# Patient Record
Sex: Female | Born: 1960 | Race: White | Hispanic: No | Marital: Single | Smoking: Current every day smoker
Health system: Southern US, Community
[De-identification: ages and names within clinical notes are randomized; demographics above are authoritative.]

## PROBLEM LIST (undated history)

## (undated) DIAGNOSIS — E079 Disorder of thyroid, unspecified: Secondary | ICD-10-CM

## (undated) DIAGNOSIS — I10 Essential (primary) hypertension: Secondary | ICD-10-CM

## (undated) DIAGNOSIS — T4145XA Adverse effect of unspecified anesthetic, initial encounter: Secondary | ICD-10-CM

## (undated) DIAGNOSIS — M170 Bilateral primary osteoarthritis of knee: Secondary | ICD-10-CM

## (undated) DIAGNOSIS — E039 Hypothyroidism, unspecified: Secondary | ICD-10-CM

## (undated) DIAGNOSIS — F419 Anxiety disorder, unspecified: Secondary | ICD-10-CM

## (undated) DIAGNOSIS — T8859XA Other complications of anesthesia, initial encounter: Secondary | ICD-10-CM

## (undated) HISTORY — DX: Essential (primary) hypertension: I10

## (undated) HISTORY — PX: CHOLECYSTECTOMY: SHX55

## (undated) HISTORY — PX: BUNIONECTOMY: SHX129

## (undated) HISTORY — DX: Anxiety disorder, unspecified: F41.9

## (undated) HISTORY — DX: Disorder of thyroid, unspecified: E07.9

---

## 2004-10-03 ENCOUNTER — Ambulatory Visit: Payer: Self-pay | Admitting: Family Medicine

## 2004-10-17 ENCOUNTER — Ambulatory Visit: Payer: Self-pay | Admitting: Family Medicine

## 2004-11-17 ENCOUNTER — Ambulatory Visit: Payer: Self-pay | Admitting: Family Medicine

## 2004-12-22 ENCOUNTER — Ambulatory Visit: Payer: Self-pay | Admitting: Family Medicine

## 2005-03-05 ENCOUNTER — Ambulatory Visit: Payer: Self-pay | Admitting: Family Medicine

## 2005-06-22 ENCOUNTER — Ambulatory Visit: Payer: Self-pay | Admitting: Family Medicine

## 2005-10-19 ENCOUNTER — Ambulatory Visit: Payer: Self-pay | Admitting: Family Medicine

## 2005-11-06 ENCOUNTER — Ambulatory Visit: Payer: Self-pay | Admitting: Family Medicine

## 2005-11-08 ENCOUNTER — Ambulatory Visit: Payer: Self-pay | Admitting: Family Medicine

## 2005-11-08 ENCOUNTER — Encounter: Payer: Self-pay | Admitting: Family Medicine

## 2005-11-08 ENCOUNTER — Other Ambulatory Visit: Admission: RE | Admit: 2005-11-08 | Discharge: 2005-11-08 | Payer: Self-pay | Admitting: Family Medicine

## 2005-11-08 LAB — CONVERTED CEMR LAB
Albumin: 3.7 g/dL (ref 3.5–5.2)
BUN: 12 mg/dL (ref 6–23)
Basophils Absolute: 0.1 10*3/uL (ref 0.0–0.1)
CO2: 26 meq/L (ref 19–32)
Chloride: 103 meq/L (ref 96–112)
Creatinine, Ser: 0.8 mg/dL (ref 0.4–1.2)
Free T4: 1.3 ng/dL (ref 0.9–1.8)
HDL: 25.7 mg/dL — ABNORMAL LOW (ref >39.0)
Hemoglobin: 14.5 g/dL (ref 12.0–15.0)
MCHC: 33.8 g/dL (ref 30.0–36.0)
MCV: 92.7 fL (ref 78.0–100.0)
Monocytes Absolute: 0.5 10*3/uL (ref 0.2–0.7)
Monocytes Relative: 5.9 % (ref 3.0–11.0)
Neutro Abs: 5.2 10*3/uL (ref 1.4–7.7)
Neutrophils Relative %: 61.5 % (ref 43.0–77.0)
RDW: 12.5 % (ref 11.5–14.6)
Sodium: 137 meq/L (ref 135–145)
Total Bilirubin: 0.6 mg/dL (ref 0.3–1.2)
Triglyceride fasting, serum: 150 mg/dL — ABNORMAL HIGH (ref 0–149)
VLDL: 30 mg/dL (ref 0–40)

## 2005-11-15 ENCOUNTER — Ambulatory Visit: Payer: Self-pay

## 2005-11-15 ENCOUNTER — Encounter: Payer: Self-pay | Admitting: Cardiology

## 2005-11-15 ENCOUNTER — Encounter: Admission: RE | Admit: 2005-11-15 | Discharge: 2005-11-15 | Payer: Self-pay | Admitting: Family Medicine

## 2006-01-25 ENCOUNTER — Ambulatory Visit: Payer: Self-pay | Admitting: Family Medicine

## 2006-03-12 DIAGNOSIS — E039 Hypothyroidism, unspecified: Secondary | ICD-10-CM

## 2006-03-12 DIAGNOSIS — I10 Essential (primary) hypertension: Secondary | ICD-10-CM

## 2006-08-23 ENCOUNTER — Ambulatory Visit: Payer: Self-pay | Admitting: Family Medicine

## 2006-08-23 DIAGNOSIS — L723 Sebaceous cyst: Secondary | ICD-10-CM

## 2006-09-06 ENCOUNTER — Ambulatory Visit: Payer: Self-pay | Admitting: Family Medicine

## 2006-09-19 ENCOUNTER — Ambulatory Visit: Payer: Self-pay | Admitting: Family Medicine

## 2006-10-09 ENCOUNTER — Telehealth (INDEPENDENT_AMBULATORY_CARE_PROVIDER_SITE_OTHER): Payer: Self-pay | Admitting: *Deleted

## 2007-02-17 ENCOUNTER — Telehealth (INDEPENDENT_AMBULATORY_CARE_PROVIDER_SITE_OTHER): Payer: Self-pay | Admitting: *Deleted

## 2007-03-10 ENCOUNTER — Encounter: Payer: Self-pay | Admitting: Family Medicine

## 2007-03-10 ENCOUNTER — Ambulatory Visit: Payer: Self-pay | Admitting: Family Medicine

## 2007-03-10 ENCOUNTER — Other Ambulatory Visit: Admission: RE | Admit: 2007-03-10 | Discharge: 2007-03-10 | Payer: Self-pay | Admitting: Family Medicine

## 2007-03-10 DIAGNOSIS — G43909 Migraine, unspecified, not intractable, without status migrainosus: Secondary | ICD-10-CM | POA: Insufficient documentation

## 2007-03-10 LAB — CONVERTED CEMR LAB
Bilirubin Urine: NEGATIVE
Blood in Urine, dipstick: NEGATIVE
Glucose, Urine, Semiquant: NEGATIVE
Ketones, urine, test strip: NEGATIVE
Protein, U semiquant: NEGATIVE
Urobilinogen, UA: NEGATIVE
pH: 5

## 2007-03-17 ENCOUNTER — Encounter (INDEPENDENT_AMBULATORY_CARE_PROVIDER_SITE_OTHER): Payer: Self-pay | Admitting: *Deleted

## 2007-03-21 LAB — CONVERTED CEMR LAB
AST: 14 units/L (ref 0–37)
Albumin: 3.6 g/dL (ref 3.5–5.2)
Alkaline Phosphatase: 73 units/L (ref 39–117)
BUN: 12 mg/dL (ref 6–23)
Basophils Absolute: 0.3 10*3/uL — ABNORMAL HIGH (ref 0.0–0.1)
Chloride: 103 meq/L (ref 96–112)
Cholesterol: 226 mg/dL (ref 0–200)
Creatinine, Ser: 0.7 mg/dL (ref 0.4–1.2)
Direct LDL: 177.7 mg/dL
Eosinophils Absolute: 0.3 10*3/uL (ref 0.0–0.6)
GFR calc non Af Amer: 95 mL/min
HDL: 27.1 mg/dL — ABNORMAL LOW (ref >39.0)
MCHC: 33.6 g/dL (ref 30.0–36.0)
MCV: 94.5 fL (ref 78.0–100.0)
Monocytes Relative: 17.5 % — ABNORMAL HIGH (ref 3.0–11.0)
Neutrophils Relative %: 28.9 % — ABNORMAL LOW (ref 43.0–77.0)
Platelets: 192 10*3/uL (ref 150–400)
RBC: 4.63 M/uL (ref 3.87–5.11)
RDW: 12.5 % (ref 11.5–14.6)
Sodium: 137 meq/L (ref 135–145)
TSH: 2.89 microintl units/mL (ref 0.35–5.50)
Total Bilirubin: 0.7 mg/dL (ref 0.3–1.2)
Total CHOL/HDL Ratio: 8.3
Triglycerides: 130 mg/dL (ref 0–149)

## 2007-05-05 ENCOUNTER — Telehealth (INDEPENDENT_AMBULATORY_CARE_PROVIDER_SITE_OTHER): Payer: Self-pay | Admitting: *Deleted

## 2007-05-06 ENCOUNTER — Ambulatory Visit: Payer: Self-pay | Admitting: Family Medicine

## 2007-05-06 DIAGNOSIS — S301XXA Contusion of abdominal wall, initial encounter: Secondary | ICD-10-CM

## 2007-05-06 DIAGNOSIS — F411 Generalized anxiety disorder: Secondary | ICD-10-CM

## 2007-05-06 DIAGNOSIS — S8000XA Contusion of unspecified knee, initial encounter: Secondary | ICD-10-CM

## 2007-05-06 DIAGNOSIS — S9030XA Contusion of unspecified foot, initial encounter: Secondary | ICD-10-CM

## 2007-05-09 ENCOUNTER — Telehealth: Payer: Self-pay | Admitting: Family Medicine

## 2007-05-09 DIAGNOSIS — M25569 Pain in unspecified knee: Secondary | ICD-10-CM

## 2007-05-12 ENCOUNTER — Encounter: Payer: Self-pay | Admitting: Family Medicine

## 2007-05-14 ENCOUNTER — Telehealth (INDEPENDENT_AMBULATORY_CARE_PROVIDER_SITE_OTHER): Payer: Self-pay | Admitting: *Deleted

## 2007-05-21 ENCOUNTER — Encounter: Payer: Self-pay | Admitting: Family Medicine

## 2007-05-23 HISTORY — PX: KNEE SURGERY: SHX244

## 2007-05-26 ENCOUNTER — Ambulatory Visit (HOSPITAL_COMMUNITY): Admission: RE | Admit: 2007-05-26 | Discharge: 2007-05-26 | Payer: Self-pay | Admitting: Orthopedic Surgery

## 2007-06-30 ENCOUNTER — Encounter: Payer: Self-pay | Admitting: Family Medicine

## 2007-07-10 ENCOUNTER — Encounter: Payer: Self-pay | Admitting: Internal Medicine

## 2007-07-10 ENCOUNTER — Telehealth (INDEPENDENT_AMBULATORY_CARE_PROVIDER_SITE_OTHER): Payer: Self-pay | Admitting: *Deleted

## 2007-07-11 ENCOUNTER — Ambulatory Visit: Payer: Self-pay | Admitting: Family Medicine

## 2007-07-17 ENCOUNTER — Ambulatory Visit: Payer: Self-pay | Admitting: Internal Medicine

## 2007-07-17 DIAGNOSIS — F329 Major depressive disorder, single episode, unspecified: Secondary | ICD-10-CM | POA: Insufficient documentation

## 2007-07-17 DIAGNOSIS — H1030 Unspecified acute conjunctivitis, unspecified eye: Secondary | ICD-10-CM | POA: Insufficient documentation

## 2007-07-21 ENCOUNTER — Telehealth (INDEPENDENT_AMBULATORY_CARE_PROVIDER_SITE_OTHER): Payer: Self-pay | Admitting: *Deleted

## 2007-07-22 ENCOUNTER — Ambulatory Visit: Payer: Self-pay | Admitting: Internal Medicine

## 2007-07-22 DIAGNOSIS — R5383 Other fatigue: Secondary | ICD-10-CM

## 2007-07-22 DIAGNOSIS — R5381 Other malaise: Secondary | ICD-10-CM

## 2007-07-22 DIAGNOSIS — K219 Gastro-esophageal reflux disease without esophagitis: Secondary | ICD-10-CM

## 2007-07-23 ENCOUNTER — Ambulatory Visit: Payer: Self-pay | Admitting: Internal Medicine

## 2007-07-26 LAB — CONVERTED CEMR LAB
AST: 23 units/L (ref 0–37)
Alkaline Phosphatase: 86 units/L (ref 39–117)
Basophils Relative: 0.9 % (ref 0.0–1.0)
Bilirubin, Direct: 0.1 mg/dL (ref 0.0–0.3)
GFR calc non Af Amer: 95 mL/min
HCT: 41.6 % (ref 36.0–46.0)
Hemoglobin: 14.6 g/dL (ref 12.0–15.0)
Lymphocytes Relative: 24.4 % (ref 12.0–46.0)
Monocytes Absolute: 0.2 10*3/uL (ref 0.1–1.0)
Monocytes Relative: 2.2 % — ABNORMAL LOW (ref 3.0–12.0)
Neutro Abs: 5.8 10*3/uL (ref 1.4–7.7)
RBC: 4.41 M/uL (ref 3.87–5.11)
Total Bilirubin: 0.8 mg/dL (ref 0.3–1.2)

## 2007-07-28 ENCOUNTER — Encounter (INDEPENDENT_AMBULATORY_CARE_PROVIDER_SITE_OTHER): Payer: Self-pay | Admitting: *Deleted

## 2007-07-31 ENCOUNTER — Encounter: Payer: Self-pay | Admitting: Family Medicine

## 2007-08-19 ENCOUNTER — Ambulatory Visit: Payer: Self-pay | Admitting: Family Medicine

## 2007-08-25 ENCOUNTER — Telehealth (INDEPENDENT_AMBULATORY_CARE_PROVIDER_SITE_OTHER): Payer: Self-pay | Admitting: *Deleted

## 2007-08-26 ENCOUNTER — Ambulatory Visit: Payer: Self-pay | Admitting: Family Medicine

## 2007-08-27 ENCOUNTER — Encounter: Payer: Self-pay | Admitting: Family Medicine

## 2007-09-17 ENCOUNTER — Telehealth (INDEPENDENT_AMBULATORY_CARE_PROVIDER_SITE_OTHER): Payer: Self-pay | Admitting: *Deleted

## 2007-09-18 ENCOUNTER — Encounter: Payer: Self-pay | Admitting: Family Medicine

## 2007-09-24 ENCOUNTER — Telehealth: Payer: Self-pay | Admitting: Family Medicine

## 2007-10-01 ENCOUNTER — Encounter: Payer: Self-pay | Admitting: Family Medicine

## 2007-11-04 ENCOUNTER — Ambulatory Visit: Payer: Self-pay | Admitting: Family Medicine

## 2007-11-04 DIAGNOSIS — H6690 Otitis media, unspecified, unspecified ear: Secondary | ICD-10-CM | POA: Insufficient documentation

## 2007-11-19 ENCOUNTER — Telehealth (INDEPENDENT_AMBULATORY_CARE_PROVIDER_SITE_OTHER): Payer: Self-pay | Admitting: *Deleted

## 2007-11-27 ENCOUNTER — Encounter: Payer: Self-pay | Admitting: Family Medicine

## 2007-12-15 ENCOUNTER — Telehealth (INDEPENDENT_AMBULATORY_CARE_PROVIDER_SITE_OTHER): Payer: Self-pay | Admitting: *Deleted

## 2008-01-21 ENCOUNTER — Ambulatory Visit: Payer: Self-pay | Admitting: Family Medicine

## 2008-01-21 DIAGNOSIS — M5430 Sciatica, unspecified side: Secondary | ICD-10-CM | POA: Insufficient documentation

## 2008-02-11 ENCOUNTER — Telehealth: Payer: Self-pay | Admitting: Family Medicine

## 2008-03-15 ENCOUNTER — Ambulatory Visit: Payer: Self-pay | Admitting: Family Medicine

## 2008-03-15 LAB — CONVERTED CEMR LAB
Bilirubin Urine: NEGATIVE
Glucose, Urine, Semiquant: NEGATIVE
Protein, U semiquant: NEGATIVE
Specific Gravity, Urine: <1.005
pH: 7.5

## 2008-03-25 LAB — CONVERTED CEMR LAB
AST: 16 units/L (ref 0–37)
Albumin: 3.4 g/dL — ABNORMAL LOW (ref 3.5–5.2)
BUN: 8 mg/dL (ref 6–23)
Basophils Absolute: 0 10*3/uL (ref 0.0–0.1)
Basophils Relative: 0.3 % (ref 0.0–3.0)
Calcium: 8.8 mg/dL (ref 8.4–10.5)
Chloride: 103 meq/L (ref 96–112)
Cholesterol: 189 mg/dL (ref 0–200)
Creatinine, Ser: 0.7 mg/dL (ref 0.4–1.2)
Eosinophils Absolute: 0.3 10*3/uL (ref 0.0–0.7)
GFR calc Af Amer: 115 mL/min
GFR calc non Af Amer: 95 mL/min
HCT: 41.5 % (ref 36.0–46.0)
HDL: 30.8 mg/dL — ABNORMAL LOW (ref >39.0)
MCHC: 33.8 g/dL (ref 30.0–36.0)
MCV: 94.1 fL (ref 78.0–100.0)
Monocytes Absolute: 0.4 10*3/uL (ref 0.1–1.0)
Neutrophils Relative %: 55.6 % (ref 43.0–77.0)
Platelets: 172 10*3/uL (ref 150–400)
RBC: 4.41 M/uL (ref 3.87–5.11)
TSH: 2.64 microintl units/mL (ref 0.35–5.50)
Total Bilirubin: 0.6 mg/dL (ref 0.3–1.2)
VLDL: 16 mg/dL (ref 0–40)

## 2008-03-26 ENCOUNTER — Encounter (INDEPENDENT_AMBULATORY_CARE_PROVIDER_SITE_OTHER): Payer: Self-pay | Admitting: *Deleted

## 2008-04-08 ENCOUNTER — Encounter: Payer: Self-pay | Admitting: Family Medicine

## 2008-04-29 ENCOUNTER — Encounter: Payer: Self-pay | Admitting: Family Medicine

## 2008-04-29 ENCOUNTER — Ambulatory Visit: Payer: Self-pay | Admitting: Family Medicine

## 2008-04-29 ENCOUNTER — Other Ambulatory Visit: Admission: RE | Admit: 2008-04-29 | Discharge: 2008-04-29 | Payer: Self-pay | Admitting: Family Medicine

## 2008-05-01 ENCOUNTER — Encounter: Payer: Self-pay | Admitting: Family Medicine

## 2008-05-04 ENCOUNTER — Encounter (INDEPENDENT_AMBULATORY_CARE_PROVIDER_SITE_OTHER): Payer: Self-pay | Admitting: *Deleted

## 2008-05-13 ENCOUNTER — Encounter (INDEPENDENT_AMBULATORY_CARE_PROVIDER_SITE_OTHER): Payer: Self-pay | Admitting: *Deleted

## 2008-06-07 ENCOUNTER — Encounter: Payer: Self-pay | Admitting: Family Medicine

## 2008-06-18 ENCOUNTER — Encounter: Payer: Self-pay | Admitting: Family Medicine

## 2008-06-23 ENCOUNTER — Telehealth: Payer: Self-pay | Admitting: Family Medicine

## 2008-06-24 ENCOUNTER — Encounter: Admission: RE | Admit: 2008-06-24 | Discharge: 2008-06-24 | Payer: Self-pay | Admitting: Family Medicine

## 2008-06-25 ENCOUNTER — Ambulatory Visit (HOSPITAL_COMMUNITY): Admission: RE | Admit: 2008-06-25 | Discharge: 2008-06-25 | Payer: Self-pay | Admitting: Surgery

## 2008-07-13 ENCOUNTER — Ambulatory Visit (HOSPITAL_BASED_OUTPATIENT_CLINIC_OR_DEPARTMENT_OTHER): Admission: RE | Admit: 2008-07-13 | Discharge: 2008-07-13 | Payer: Self-pay | Admitting: Surgery

## 2008-07-13 ENCOUNTER — Encounter: Admission: RE | Admit: 2008-07-13 | Discharge: 2008-07-13 | Payer: Self-pay | Admitting: Surgery

## 2008-07-14 ENCOUNTER — Ambulatory Visit (HOSPITAL_COMMUNITY): Admission: RE | Admit: 2008-07-14 | Discharge: 2008-07-14 | Payer: Self-pay | Admitting: Surgery

## 2008-07-17 ENCOUNTER — Ambulatory Visit: Payer: Self-pay | Admitting: Internal Medicine

## 2008-09-01 ENCOUNTER — Encounter: Payer: Self-pay | Admitting: Family Medicine

## 2008-09-29 ENCOUNTER — Encounter: Payer: Self-pay | Admitting: Family Medicine

## 2008-10-27 ENCOUNTER — Encounter: Payer: Self-pay | Admitting: Family Medicine

## 2008-11-01 ENCOUNTER — Ambulatory Visit: Payer: Self-pay | Admitting: Family Medicine

## 2008-11-19 ENCOUNTER — Ambulatory Visit: Payer: Self-pay | Admitting: Family Medicine

## 2009-05-05 ENCOUNTER — Ambulatory Visit: Payer: Self-pay | Admitting: Diagnostic Radiology

## 2009-05-05 ENCOUNTER — Ambulatory Visit (HOSPITAL_BASED_OUTPATIENT_CLINIC_OR_DEPARTMENT_OTHER): Admission: RE | Admit: 2009-05-05 | Discharge: 2009-05-05 | Payer: Self-pay | Admitting: Family Medicine

## 2009-05-05 ENCOUNTER — Ambulatory Visit: Payer: Self-pay | Admitting: Family Medicine

## 2009-05-05 DIAGNOSIS — R0602 Shortness of breath: Secondary | ICD-10-CM

## 2009-05-05 DIAGNOSIS — R000 Tachycardia, unspecified: Secondary | ICD-10-CM

## 2009-05-05 DIAGNOSIS — F438 Other reactions to severe stress: Secondary | ICD-10-CM

## 2009-05-05 LAB — CONVERTED CEMR LAB
ALT: 24 units/L (ref 0–35)
AST: 19 units/L (ref 0–37)
Albumin: 3.8 g/dL (ref 3.5–5.2)
Alkaline Phosphatase: 81 units/L (ref 39–117)
BUN: 11 mg/dL (ref 6–23)
Basophils Absolute: 0.1 10*3/uL (ref 0.0–0.1)
Basophils Relative: 0.8 % (ref 0.0–3.0)
Bilirubin, Direct: 0 mg/dL (ref 0.0–0.3)
CK-MB: 1.8 ng/mL (ref 0.3–4.0)
CO2: 28 meq/L (ref 19–32)
Calcium: 9.3 mg/dL (ref 8.4–10.5)
Chloride: 103 meq/L (ref 96–112)
Creatinine, Ser: 0.6 mg/dL (ref 0.4–1.2)
Eosinophils Absolute: 0.3 10*3/uL (ref 0.0–0.7)
Eosinophils Relative: 3.7 % (ref 0.0–5.0)
GFR calc non Af Amer: 112.8 mL/min (ref >60)
Glucose, Bld: 125 mg/dL — ABNORMAL HIGH (ref 70–99)
HCT: 41.1 % (ref 36.0–46.0)
Hemoglobin: 14.2 g/dL (ref 12.0–15.0)
Lymphocytes Relative: 26.4 % (ref 12.0–46.0)
Lymphs Abs: 2 10*3/uL (ref 0.7–4.0)
MCHC: 34.4 g/dL (ref 30.0–36.0)
MCV: 94 fL (ref 78.0–100.0)
Monocytes Absolute: 0.4 10*3/uL (ref 0.1–1.0)
Monocytes Relative: 6 % (ref 3.0–12.0)
Neutro Abs: 4.7 10*3/uL (ref 1.4–7.7)
Neutrophils Relative %: 63.1 % (ref 43.0–77.0)
Platelets: 198 10*3/uL (ref 150.0–400.0)
Potassium: 4.6 meq/L (ref 3.5–5.1)
RBC: 4.37 M/uL (ref 3.87–5.11)
RDW: 13.9 % (ref 11.5–14.6)
Relative Index: 2.6 — ABNORMAL HIGH (ref 0.0–2.5)
Sodium: 139 meq/L (ref 135–145)
TSH: 7.45 microintl units/mL — ABNORMAL HIGH (ref 0.35–5.50)
Total Bilirubin: 0.5 mg/dL (ref 0.3–1.2)
Total CK: 70 units/L (ref 7–177)
Total Protein: 7.4 g/dL (ref 6.0–8.3)
Troponin I: <0.01 ng/mL (ref <0.06)
WBC: 7.4 10*3/uL (ref 4.5–10.5)

## 2009-05-06 ENCOUNTER — Telehealth: Payer: Self-pay | Admitting: Family Medicine

## 2009-05-06 ENCOUNTER — Ambulatory Visit: Payer: Self-pay | Admitting: Family Medicine

## 2009-05-11 ENCOUNTER — Encounter (INDEPENDENT_AMBULATORY_CARE_PROVIDER_SITE_OTHER): Payer: Self-pay | Admitting: *Deleted

## 2009-05-27 ENCOUNTER — Encounter: Payer: Self-pay | Admitting: Family Medicine

## 2009-05-27 ENCOUNTER — Ambulatory Visit: Payer: Self-pay | Admitting: Cardiology

## 2009-05-27 ENCOUNTER — Ambulatory Visit (HOSPITAL_COMMUNITY): Admission: RE | Admit: 2009-05-27 | Discharge: 2009-05-27 | Payer: Self-pay | Admitting: Family Medicine

## 2009-05-27 ENCOUNTER — Ambulatory Visit: Payer: Self-pay

## 2009-05-30 ENCOUNTER — Telehealth (INDEPENDENT_AMBULATORY_CARE_PROVIDER_SITE_OTHER): Payer: Self-pay | Admitting: *Deleted

## 2009-11-11 ENCOUNTER — Ambulatory Visit: Payer: Self-pay | Admitting: Family Medicine

## 2009-11-11 DIAGNOSIS — F4310 Post-traumatic stress disorder, unspecified: Secondary | ICD-10-CM

## 2009-12-01 ENCOUNTER — Telehealth (INDEPENDENT_AMBULATORY_CARE_PROVIDER_SITE_OTHER): Payer: Self-pay | Admitting: *Deleted

## 2010-01-31 ENCOUNTER — Ambulatory Visit
Admission: RE | Admit: 2010-01-31 | Discharge: 2010-01-31 | Payer: Self-pay | Source: Home / Self Care | Attending: Family Medicine | Admitting: Family Medicine

## 2010-01-31 ENCOUNTER — Encounter: Payer: Self-pay | Admitting: Family Medicine

## 2010-01-31 DIAGNOSIS — M25559 Pain in unspecified hip: Secondary | ICD-10-CM | POA: Insufficient documentation

## 2010-02-02 ENCOUNTER — Encounter: Payer: Self-pay | Admitting: Family Medicine

## 2010-02-03 ENCOUNTER — Encounter (INDEPENDENT_AMBULATORY_CARE_PROVIDER_SITE_OTHER): Payer: Self-pay | Admitting: *Deleted

## 2010-02-21 NOTE — Progress Notes (Signed)
Summary: Results  Phone Note Call from Patient Call back at Home Phone [phone]   Caller: Patient Summary of Call: Patient is requesting a call back concerning the results of her echo. Please advise Initial call taken by: Barb Merino,  May 06, 2009 11:09 AM  Follow-up for Phone Call        pt requesting that dr Laury Axon give her a call to discuss chest x-ray results further in detail and why echo is being requested..........Marland KitchenFelecia Deloach CMA  May 06, 2009 11:30 AM   Additional Follow-up for Phone Call Additional follow up Details #1::        Here heart is big on xray ( from htn and weight) but the pulmonary vessels  were prominent and the radiologist could not r/o congestion---we need to check function of the heart. Additional Follow-up by: Loreen Freud DO,  May 06, 2009 11:35 AM    Additional Follow-up for Phone Call Additional follow up Details #2::    offer pt appt to discuss matter further with dr Laury Axon pt coming in this afternoon. Discuss dr Laury Axon response with pt..................Marland KitchenFelecia Deloach CMA  May 06, 2009 12:12 PM

## 2010-02-21 NOTE — Progress Notes (Signed)
Summary: refill  Phone Note Refill Request Message from:  Fax from Pharmacy on December 01, 2009 10:52 AM  Refills Requested: Medication #1:  ZOCOR 40 MG TABS 1 by mouth once daily  Medication #2:  SYNTHROID 150 MCG TABS 1 daily Leward Quan Dolliver - [id number]  Initial call taken by: Okey Regal Spring,  December 01, 2009 10:53 AM    Prescriptions: ZOCOR 40 MG TABS (SIMVASTATIN) 1 by mouth once daily  #90 x 0   Entered by:   Almeta Monas CMA (AAMA)   Authorized by:   Loreen Freud DO   Signed by:   Almeta Monas CMA (AAMA) on 12/01/2009   Method used:   Electron[facility] to        [facility] [phone][facility] (retail[facility]       [address]       [place], [place]  [address]       Ph: [phone]       Fax: [phone]   RxID:   [id number] SYNTHROID 150 MCG TABS (LEVOTHYROXINE SODIUM) 1 daily  #90 x 0   Entered by:   Almeta Monas CMA (AAMA)   Authorized by:   Loreen Freud DO   Signed by:   Almeta Monas CMA (AAMA) on 12/01/2009   Method used:   [facility] to        [facility] [phone][facility] (retail)       [address]       [place], [place]  [address]       Ph: [phone]       Fax: [phone]   RxID:   [id number]

## 2010-02-21 NOTE — Progress Notes (Signed)
Summary: Echo Results   Phone Note Outgoing Call   Call placed by: Army Fossa CMA,  May 30, 2009 10:53 AM Reason for Call: Discuss lab or test results Summary of Call: Echo Results, LMTCB:  trivial murmur only----otherwise normal Signed by Loreen Freud DO on 05/29/2009 at 2:06 PM  Follow-up for Phone Call        Patient is aware of informatio.Kristin Hubbard  May 30, 2009 10:59 AM

## 2010-02-21 NOTE — Assessment & Plan Note (Signed)
Summary: can't catch her breath for past 2 days//lch   Vital Signs:  Patient profile:   50 year old female Menstrual status:  regular Weight:      350.50 pounds BMI:     54.28 O2 Sat:      95 % on Room air Pulse rate:   91 / minute Pulse rhythm:   regular BP sitting:   126 / 88  (left arm) Cuff size:   large  Vitals Entered By: Army Fossa CMA (May 05, 2009 10:46 AM)  O2 Flow:  Room air CC: Pt here c/o hard time catching breath over past 2 days. Denies any CP. When doing her Pulse ox- her oxgen was anywhere from 88-93 %.    History of Present Illness: Pt here c/o not being able to catch breath for 2 days -- pt denies congestion , fever , cough.  + some wheezing,  some chest burning at times but no chest pain.  Pt is under a lot of stress---- Pt fathers wife is leaving him,  her mother is leaving assisted living to move in with boy friend,  her sisters boyfriend died and she just received a foster child emergently---pt is under a lot of stress.    Allergies (verified): No Known Drug Allergies  Physical Exam  General:  Well-developed,well-nourished,in no acute distress; alert,appropriate and cooperative throughout examinationoverweight-appearing.   Neck:  No deformities, masses, or tenderness noted. Lungs:  Normal respiratory effort, chest expands symmetrically. Lungs are clear to auscultation, no crackles or wheezes. Heart:  normal rate and no murmur.   Psych:  Oriented X3, tearful, and moderately anxious.     Impression & Recommendations:  Problem # 1:  SHORTNESS OF BREATH (ICD-786.05)  Orders: T-2 View CXR (71020TC) Venipuncture (52841) TLB-Cardiac Panel (32440_10272-ZDGU) TLB-BMP (Basic Metabolic Panel-BMET) (80048-METABOL) TLB-CBC Platelet - w/Differential (85025-CBCD) TLB-Hepatic/Liver Function Pnl (80076-HEPATIC) TLB-TSH (Thyroid Stimulating Hormone) (44034-VQQ) T-D-Dimer Fibrin Derivatives Quantitive [phone]) T- * Misc. Laboratory test [phone]) EKG w/  Interpretation (93000)  Problem # 2:  OTHER ACUTE REACTIONS TO STRESS (ICD-308.3) xanax 0.25 three times a day as needed  rto after labs done to discuss further prn  Complete Medication List: 1)  Synthroid 150 Mcg Tabs (Levothyroxine sodium) .Marland Kitchen.. 1 daily 2)  Percocet 5-325 Mg Tabs (Oxycodone-acetaminophen) .Marland Kitchen.. 1-2 by mouth every 4-6 hours as needed 3)  Coreg 12.5 Mg Tabs (Carvedilol) .Marland Kitchen.. 1 by mouth two times a day 4)  Zocor 40 Mg Tabs (Simvastatin) .Marland Kitchen.. 1 by mouth once daily 5)  Alprazolam 0.25 Mg Tabs (Alprazolam) .Marland Kitchen.. 1 by mouth three times a day as needed  Patient Instructions: 1)  go to er if symptoms worsen Prescriptions: ALPRAZOLAM 0.25 MG TABS (ALPRAZOLAM) 1 by mouth three times a day as needed  #30 x 0   Entered and Authorized by:   Loreen Freud DO   Signed by:   Loreen Freud DO on 05/05/2009   Method used:   Print then Give to Patient   RxID:   [id number]    EKG  Procedure date:  05/05/2009  Findings:      Normal sinus rhythm with rate of:  80 bpm

## 2010-02-21 NOTE — Assessment & Plan Note (Signed)
Summary: discuss results//fd   Vital Signs:  Patient profile:   50 year old female Menstrual status:  regular Weight:      350.31 pounds Pulse rate:   90 / minute Pulse rhythm:   regular BP sitting:   138 / 86  (left arm) Cuff size:   large  Vitals Entered By: Army Fossa CMA (May 06, 2009 3:49 PM) CC: Pt here to discuss cxr  results.   History of Present Illness: pt here with a friend who is an Charity fundraiser to discuss cxr further and reasoning for echo.  Pt state xanax is helping a lot.    Allergies (verified): No Known Drug Allergies  Physical Exam  General:  Well-developed,well-nourished,in no acute distress; alert,appropriate and cooperative throughout examinationoverweight-appearing.   Psych:  Oriented X3, tearful, and moderately anxious.     Impression & Recommendations:  Problem # 1:  SHORTNESS OF BREATH (ICD-786.05) abn on cxr check echo  Problem # 2:  OTHER ACUTE REACTIONS TO STRESS (ICD-308.3) Assessment: Improved con't xanax  Problem # 3:  GERD (ICD-530.81)  Her updated medication list for this problem includes:    Nexium 40 Mg Cpdr (Esomeprazole magnesium) .Marland Kitchen... 1 by mouth once daily  Diagnostics Reviewed:  Discussed lifestyle modifications, diet, antacids/medications, and preventive measures. Handout provided.   Complete Medication List: 1)  Synthroid 150 Mcg Tabs (Levothyroxine sodium) .Marland Kitchen.. 1 daily 2)  Percocet 5-325 Mg Tabs (Oxycodone-acetaminophen) .Marland Kitchen.. 1-2 by mouth every 4-6 hours as needed 3)  Coreg 25 Mg Tabs (Carvedilol) .Marland Kitchen.. 1 by mouth two times a day 4)  Zocor 40 Mg Tabs (Simvastatin) .Marland Kitchen.. 1 by mouth once daily 5)  Alprazolam 0.25 Mg Tabs (Alprazolam) .Marland Kitchen.. 1 by mouth three times a day as needed 6)  Nexium 40 Mg Cpdr (Esomeprazole magnesium) .Marland Kitchen.. 1 by mouth once daily Prescriptions: COREG 25 MG TABS (CARVEDILOL) 1 by mouth two times a day  #60 x 2   Entered and Authorized by:   Loreen Freud DO   Signed by:   Loreen Freud DO on 05/06/2009  Method used:   Print then Give to Patient   RxID:   [id number]

## 2010-02-21 NOTE — Letter (Signed)
Summary: Primary Care Consult Scheduled Letter  [facility]  [address] [place], [place] [address]   Phone: [phone]  Fax: [phone]      05/11/2009 MRN: [id number]  Kristin Hubbard [address] [place], [place]  [address]    Dear Ms. Holloran,    We have scheduled an appointment for you.  At the recommendation of Dr. Loreen Freud, we have scheduled you for an Echocardiogram with [facility] on 05-26-2009 at 4:00pm.  Their address is [address], [address] floor, [place] [address]. The office phone number is [phone].  If this appointment day and time is not convenient for you, please feel free to call the office of the doctor you are being referred to at the number listed above and reschedule the appointment.    It is important for you to keep your scheduled appointments. We are here to make sure you are given good patient care.   Thank you,    Renee, Patient Care Coordinator [facility]

## 2010-02-21 NOTE — Assessment & Plan Note (Signed)
Summary: referral to psychiatrist//lch   Vital Signs:  Patient profile:   50 year old female Menstrual status:  regular Weight:      349.2 pounds Temp:     98.0 degrees F oral Pulse rate:   84 / minute Pulse rhythm:   regular BP sitting:   138 / 82  (left arm) Cuff size:   large  Vitals Entered By: Almeta Monas CMA Duncan Dull) (November 11, 2009 1:28 PM) CC: wants to discuss psych referral   History of Present Illness: Pt is here about her fears secondary to fall several years ago.  They are still in court about the accident. Pt has significant PTSD and feels she needs to see someone about this.    Current Medications (verified): 1)  Synthroid 150 Mcg Tabs (Levothyroxine Sodium) .Marland Kitchen.. 1 Daily 2)  Percocet 5-325 Mg Tabs (Oxycodone-Acetaminophen) .Marland Kitchen.. 1-2 By Mouth Every 4-6 Hours As Needed 3)  Coreg 25 Mg Tabs (Carvedilol) .Marland Kitchen.. 1 By Mouth Two Times A Day 4)  Zocor 40 Mg Tabs (Simvastatin) .Marland Kitchen.. 1 By Mouth Once Daily 5)  Alprazolam 0.25 Mg Tabs (Alprazolam) .Marland Kitchen.. 1 By Mouth Three Times A Day As Needed 6)  Nexium 40 Mg Cpdr (Esomeprazole Magnesium) .Marland Kitchen.. 1 By Mouth Once Daily 7)  Celexa 20 Mg Tabs (Citalopram Hydrobromide) .Marland Kitchen.. 1 By Mouth Once Daily  Allergies (verified): No Known Drug Allergies  Past History:  Past medical, surgical, family and social histories (including risk factors) reviewed for relevance to current acute and chronic problems.  Past Medical History: Reviewed history from 05/06/2007 and no changes required. Hypertension Hypothyroidism Migraine Anxiety  Past Surgical History: Reviewed history from 07/17/2007 and no changes required. Cholecystectomy (2003) L knee--05/2007  Family History: Reviewed history from 07/22/2007 and no changes required. Family History Diabetes 1st degree relative PGM--brain tumor in late 70's yo age Father: alcoholism , DM Mother: alcoholism Siblings: sister "hypochondriac"  Social History: Reviewed history from 03/15/2008 and  no changes required. Occupation: Tax inspector Single Current Smoker Alcohol use-no Drug use-no  Regular exercise-yes  Review of Systems      See HPI  Physical Exam  General:  Well-developed,well-nourished,in no acute distress; alert,appropriate and cooperative throughout examinationoverweight-appearing.   Psych:  Oriented X3, normally interactive, and tearful.     Impression & Recommendations:  Problem # 1:  POSTTRAUMATIC STRESS DISORDER (ICD-309.81)  pt to see [facility] start celexa 20 mg 1 by mouth once daily  con't xanax  Complete Medication List: 1)  Synthroid 150 Mcg Tabs (Levothyroxine sodium) .Marland Kitchen.. 1 daily 2)  Percocet 5-325 Mg Tabs (Oxycodone-acetaminophen) .Marland Kitchen.. 1-2 by mouth every 4-6 hours as needed 3)  Coreg 25 Mg Tabs (Carvedilol) .Marland Kitchen.. 1 by mouth two times a day 4)  Zocor 40 Mg Tabs (Simvastatin) .Marland Kitchen.. 1 by mouth once daily 5)  Alprazolam 0.25 Mg Tabs (Alprazolam) .Marland Kitchen.. 1 by mouth three times a day as needed 6)  Nexium 40 Mg Cpdr (Esomeprazole magnesium) .Marland Kitchen.. 1 by mouth once daily 7)  Celexa 20 Mg Tabs (Citalopram hydrobromide) .Marland Kitchen.. 1 by mouth once daily  Patient Instructions: 1)  schedule a physical 2)  fasting labs 401.9  272.4  bmp, hep, lipid, , 244.9  TSH Prescriptions: ZOCOR 40 MG TABS (SIMVASTATIN) 1 by mouth once daily  #90 x 3   Entered and Authorized by:   Loreen Freud DO   Signed by:   Loreen Freud DO on 11/11/2009   Method used:   Electronically to  [facility] [address] (retail)       [phone]       [place], [place]  [address]       Ph: [phone]       Fax: [phone]   RxID:   [id number] COREG 25 MG TABS (CARVEDILOL) 1 by mouth two times a day  #180 x 3   Entered and Authorized by:   Loreen Freud DO   Signed by:   Loreen Freud DO on 11/11/2009   Method used:   Electronically to        [facility] [address] (retail)       [phone]       [place], [place]  [address]       Ph: [phone]       Fax: [phone]   RxID:   [id number] SYNTHROID 150 MCG TABS (LEVOTHYROXINE SODIUM) 1 daily  #90 x 3   Entered and Authorized by:   Loreen Freud DO   Signed by:   Loreen Freud DO on 11/11/2009   Method used:   Electronically to        [facility] [address] (retail)       [phone]       [place], [place]  [address]       Ph: [phone]       Fax: [phone]   RxID:   [id number] ALPRAZOLAM 0.25 MG TABS (ALPRAZOLAM) 1 by mouth three times a day as needed  #30 x 0   Entered and Authorized by:   Loreen Freud DO   Signed by:   Loreen Freud DO on 11/11/2009   Method used:   Print then Give to Patient   RxID:   [id number] CELEXA 20 MG TABS (CITALOPRAM HYDROBROMIDE) 1 by mouth once daily  #30 x 5   Entered and Authorized by:   Loreen Freud DO   Signed by:   Loreen Freud DO on 11/11/2009   Method used:   Electronically to        [facility] [address] (retail)       [phone]       [place], [place]  [address]       Ph: [phone]       Fax: [phone]   RxID:   [id number]    Orders Added: 1)  Est. Patient Level III [[address]]

## 2010-02-23 NOTE — Letter (Signed)
Summary: Medical Eval Form  Medical Eval Form   Imported By: Lanelle Bal 02/09/2010 12:39:53  _____________________________________________________________________  External Attachment:    Type:   Image     Comment:   External Document

## 2010-02-23 NOTE — Assessment & Plan Note (Signed)
Summary: PAIN IN THE HIP AREA//PH   Vital Signs:  Patient profile:   50 year old female Menstrual status:  regular Height:      67.5 inches Weight:      350.0 pounds BMI:     54.20 Pulse rate:   80 / minute Pulse rhythm:   regular BP sitting:   136 / 90  (right arm) Cuff size:   large  Vitals Entered By: Almeta Monas CMA Duncan Dull) (January 31, 2010 9:49 AM) CC: c/o pain to the left leg and hip x78mos   History of Present Illness: Pt c/o L hip and leg pain x 2 months----No known injury.  Pt c/o weakness in L thigh.  Pt has to physically lift her L leg up to move leg.  Pt taking IB every night.  Pt needs forms filled out for foster parenting as well.  Current Medications (verified): 1)  Synthroid 150 Mcg Tabs (Levothyroxine Sodium) .Marland Kitchen.. 1 Daily 2)  Percocet 5-325 Mg Tabs (Oxycodone-Acetaminophen) .Marland Kitchen.. 1-2 By Mouth Every 4-6 Hours As Needed 3)  Coreg 25 Mg Tabs (Carvedilol) .Marland Kitchen.. 1 By Mouth Two Times A Day 4)  Zocor 40 Mg Tabs (Simvastatin) .Marland Kitchen.. 1 By Mouth Once Daily 5)  Prednisone 10 Mg Tabs (Prednisone) .... 3 By Mouth Once Daily For 3 Days Then 2 By Mouth Once Daily For 3 Days Then 1 By Mouth Once Daily For 3 Days  Allergies (verified): No Known Drug Allergies  Past History:  Past Medical History: Last updated: 05/06/2007 Hypertension Hypothyroidism Migraine Anxiety  Past Surgical History: Last updated: 07/17/2007 Cholecystectomy (2003) L knee--05/2007  Family History: Last updated: 07/22/2007 Family History Diabetes 1st degree relative PGM--brain tumor in late 94's yo age Father: alcoholism , DM Mother: alcoholism Siblings: sister "hypochondriac"  Social History: Last updated: 03/15/2008 Occupation: Therapist, [employer] Single Current Smoker Alcohol use-no Drug use-no  Regular exercise-yes  Risk Factors: Alcohol Use: 0 (04/29/2008) Caffeine Use: 2 (04/29/2008) Exercise: yes (04/29/2008)  Risk Factors: Smoking Status: current  (04/29/2008) Packs/Day: 1.0 (04/29/2008) Passive Smoke Exposure: no (03/10/2007)  Family History: Reviewed history from 07/22/2007 and no changes required. Family History Diabetes 1st degree relative PGM--brain tumor in late 83's yo age Father: alcoholism , DM Mother: alcoholism Siblings: sister "hypochondriac"  Social History: Reviewed history from 03/15/2008 and no changes required. Occupation: Tax inspector Single Current Smoker Alcohol use-no Drug use-no  Regular exercise-yes  Review of Systems      See HPI  Physical Exam  General:  Well-developed,well-nourished,in no acute distress; alert,appropriate and cooperative throughout examinationoverweight-appearing.   Msk:  weakness with flexion L hip and knee R side normal  Extremities:  left pretibial edema and right pretibial edema.   Neurologic:  gait normal.     Impression & Recommendations:  Problem # 1:  HIP PAIN, LEFT (ICD-719.45)  ? if from back--- pt will f/u ortho if no better after pred taper  Her updated medication list for this problem includes:    Percocet 5-325 Mg Tabs (Oxycodone-acetaminophen) .Marland Kitchen... 1-2 by mouth every 4-6 hours as needed  Discussed use of medications, application of heat or cold, and exercises.   Problem # 2:  MORBID OBESITY (ICD-278.01)  Ht: 67.5 (01/31/2010)   Wt: 350.0 (01/31/2010)   BMI: 54.20 (01/31/2010)  Complete Medication List: 1)  Synthroid 150 Mcg Tabs (Levothyroxine sodium) .Marland Kitchen.. 1 daily 2)  Percocet 5-325 Mg Tabs (Oxycodone-acetaminophen) .Marland Kitchen.. 1-2 by mouth every 4-6 hours as needed 3)  Coreg 25 Mg Tabs (Carvedilol) .Marland KitchenMarland KitchenMarland Kitchen  1 by mouth two times a day 4)  Zocor 40 Mg Tabs (Simvastatin) .Marland Kitchen.. 1 by mouth once daily 5)  Prednisone 10 Mg Tabs (Prednisone) .... 3 by mouth once daily for 3 days then 2 by mouth once daily for 3 days then 1 by mouth once daily for 3 days  Other Orders: TB Skin Test ([address]) Admin 1st Vaccine ([address])  Patient Instructions: 1)   fasting labs  V70.0  272.4  401.9  cbcd, hep, bmp 2)  , lipid , TSH 3)  CPE Prescriptions: PREDNISONE 10 MG TABS (PREDNISONE) 3 by mouth once daily for 3 days then 2 by mouth once daily for 3 days then 1 by mouth once daily for 3 days  #18 x 0   Entered and Authorized by:   Loreen Freud DO   Signed by:   Loreen Freud DO on 01/31/2010   Method used:   Electronically to        [facility] [address] (retail)       [phone]       [place], [place]  [address]       Ph: [phone]       Fax: [phone]   RxID:   [id number]    Orders Added: 1)  TB Skin Test [86580] 2)  Admin 1st Vaccine [90471] 3)  Est. Patient Level III [36644]   Immunizations Administered:  PPD Skin Test:    Vaccine Type: PPD    Site: right forearm    Mfr: Sanofi Pasteur    Dose: 0.1 ml    Route: ID    Given by: Almeta Monas CMA (AAMA)    Exp. Date: 07/29/2011    Lot #: [id number]   Immunizations Administered:  PPD Skin Test:    Vaccine Type: PPD    Site: right forearm    Mfr: Sanofi Pasteur    Dose: 0.1 ml    Route: ID    Given by: Almeta Monas CMA (AAMA)    Exp. Date: 07/29/2011    Lot #: [id number]

## 2010-02-23 NOTE — Miscellaneous (Signed)
Summary: Immunization Entry    PPD Results    Date of reading: 02/02/2010    Results: < 5mm    Interpretation: negative

## 2010-02-23 NOTE — Letter (Signed)
Summary: Fax from Patient Regarding TB Test  Fax from Patient Regarding TB Test   Imported By: Lanelle Bal 02/09/2010 12:39:13  _____________________________________________________________________  External Attachment:    Type:   Image     Comment:   External Document

## 2010-04-20 ENCOUNTER — Encounter: Payer: Self-pay | Admitting: Family Medicine

## 2010-04-21 ENCOUNTER — Ambulatory Visit (INDEPENDENT_AMBULATORY_CARE_PROVIDER_SITE_OTHER): Payer: 59 | Admitting: Family Medicine

## 2010-04-21 ENCOUNTER — Encounter: Payer: Self-pay | Admitting: Family Medicine

## 2010-04-21 DIAGNOSIS — E039 Hypothyroidism, unspecified: Secondary | ICD-10-CM

## 2010-04-21 DIAGNOSIS — F4310 Post-traumatic stress disorder, unspecified: Secondary | ICD-10-CM

## 2010-04-21 DIAGNOSIS — I10 Essential (primary) hypertension: Secondary | ICD-10-CM

## 2010-04-21 LAB — HEPATIC FUNCTION PANEL
Bilirubin, Direct: 0.1 mg/dL (ref 0.0–0.3)
Indirect Bilirubin: 0.5 mg/dL (ref 0.0–0.9)
Total Bilirubin: 0.6 mg/dL (ref 0.3–1.2)

## 2010-04-21 LAB — LIPID PANEL
Total CHOL/HDL Ratio: 5.4 Ratio
VLDL: 28 mg/dL (ref 0–40)

## 2010-04-21 LAB — TSH: TSH: 1.282 u[IU]/mL (ref 0.350–4.500)

## 2010-04-21 LAB — BASIC METABOLIC PANEL
BUN: 11 mg/dL (ref 6–23)
CO2: 23 mEq/L (ref 19–32)
Calcium: 9 mg/dL (ref 8.4–10.5)
Glucose, Bld: 104 mg/dL — ABNORMAL HIGH (ref 70–99)
Potassium: 4.3 mEq/L (ref 3.5–5.3)
Sodium: 137 mEq/L (ref 135–145)

## 2010-04-21 NOTE — Patient Instructions (Signed)
RTO 6 months

## 2010-04-21 NOTE — Progress Notes (Signed)
  Subjective:    Patient ID: Kristin Hubbard, female    DOB: Nov 04, 1960, 50 y.o.   MRN: [id number]  HPI Pt here to have letters written for foster care.  Pt has PTSD but has not needed treatment for it in a while.  It started after her fall at her apartment  complex April 2009.  Left leg/ knee was affected--- fractured tibia and torn meniscus.  Pt has not been able to go up or down steps since.  Pt has severe anxiety and get very emotional when she thinks she has to use steps so she avoids them.  It does not affect her activities of daily living nor will it affect her ability to provide foster care.    Pt also has hypothyroidism and needs labs done today.  It has been controlled with medication.  No complications.  It does not effect her ADLs or her ability to provide foster care.    Past Medical History  Diagnosis Date  . Hypertension   . Thyroid disease     Hypothyroidism  . Migraine   . Anxiety    Past Surgical History  Procedure Date  . Cholecystectomy   . Knee surgery 05/2007    left    Family History  Problem Relation Age of Onset  . Diabetes    . Alcohol abuse Father   . Diabetes Father   . Alcohol abuse Mother    History   Social History  . Marital Status: Single    Spouse Name: N/A    Number of Children: N/A  . Years of Education: N/A   Occupational History  . Not on file.   Social History Main Topics  . Smoking status: Current Everyday Smoker  . Smokeless tobacco: Never Used  . Alcohol Use: No  . Drug Use: No  . Sexually Active: Not on file   Other Topics Concern  . Not on file   Social History Narrative  . No narrative on file      Review of Systems As above    Objective:   Physical Exam  Constitutional: She is oriented to person, place, and time. She appears well-developed and well-nourished.  Neck: Neck supple. No thyromegaly present.  Neurological: She is alert and oriented to person, place, and time.  Psychiatric: She has a normal mood and  affect. Her behavior is normal. Judgment and thought content normal.          Assessment & Plan:  PTSD Hypothyroidism HTN

## 2010-04-21 NOTE — Progress Notes (Signed)
Addended by: Floydene Flock on: 04/21/2010 03:23 PM   Modules accepted: Orders

## 2010-04-24 ENCOUNTER — Encounter: Payer: Self-pay | Admitting: *Deleted

## 2010-06-06 NOTE — Op Note (Signed)
NAME:  ORAL, HALLGREN                ACCOUNT NO.:  [id number]   MEDICAL RECORD NO.:  [id number]          PATIENT TYPE:  AMB   LOCATION:  SDS                          FACILITY:  [facility]   PHYSICIAN:  Eulas Post, MD    DATE OF BIRTH:  08/16/1960   DATE OF PROCEDURE:  05/26/2007  DATE OF DISCHARGE:  05/26/2007                               OPERATIVE REPORT   PREOPERATIVE DIAGNOSIS:  Left knee medial meniscus tear.   POSTOPERATIVE DIAGNOSIS:  Left knee medial meniscus tear.   OPERATIVE PROCEDURE:  Left knee arthroscopy with partial medial  meniscectomy and medial femoral chondroplasty.   ANESTHESIA:  General.   ESTIMATED BLOOD LOSS:  Minimal.   PREOPERATIVE INDICATIONS:  Eila Runyan is a 50 year old woman who had a  fall and severe knee pain.  She had a preoperative MRI that indicated  medial compartment arthritis as well as a medial meniscus tear.  She  elected to undergo the above named procedures.  The risks, benefits, and  alternatives to operative intervention were discussed with her  preoperatively including but not limited the risks of infection,  bleeding, nerve injury, progression of arthritis, stiffness, loss of  function, cardiopulmonary complications, among others and she is willing  to proceed.  Of note, she had a preoperative medial tibial bone bruise,  which I discussed at length with her that she will likely have some  persistent symptoms postoperatively, but hopefully some of the sharp  medial joint line pain will be improved.   OPERATIVE FINDINGS:  The patellofemoral joint was essentially normal.  There was a region of the superior patella, which appeared to be an  unfused portion of the patella; however, this did not appear to be  contributing to her symptoms.  The lateral compartment was essentially  normal.  There was some mild fraying of the lateral meniscus.  The  medial meniscus had some impressive instability with probing, as well as  a complex  intrasubstance meniscal tear.  The medial tibial plateau had  some grade 2 softening.  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  There was no true areas of bare bone.  There  were some loose chondral flaps that were debrided.  The ACL and PCL were  normal and the knee was stable to varus and valgus and I could not  dislocate her patella.   OPERATIVE PROCEDURE:  The patient brought to the operating room, placed  in supine position.  General anesthesia was administered.  Left lower  extremity was prepped and draped in the usual sterile fashion.  Diagnostic arthroscopy was carried out with the above-named findings.  The chondral flaps were debrided as was the medial meniscus and a  partial medial meniscectomy was performed.  A small amount of lateral  meniscus was debrided with the shaver, but this was fairly minimal.  The  patellofemoral joint was left alone.  The wounds were irrigated  copiously and portals closed with Steri-Strips.  The patient was  awakened, returned to PACU in stable and satisfactory condition.  There  were no complications.  The patient tolerated the procedure well.  The  medial meniscus was debrided back to a stable rim and all loose bodies  were removed.      Eulas Post, MD  Electronically Signed     JPL/MEDQ  D:  05/26/2007  T:  05/27/2007  Job:  [id number]

## 2010-06-06 NOTE — Procedures (Signed)
NAME:  Kristin Hubbard, Kristin Hubbard                ACCOUNT NO.:  [id number]   MEDICAL RECORD NO.:  [id number]          PATIENT TYPE:  OUT   LOCATION:  SLEEP CENTER                 FACILITY:  [facility]   PHYSICIAN:  Clinton D. Maple Hudson, MD, FCCP, FACPDATE OF BIRTH:  06-22-1960   DATE OF STUDY:  07/13/2008                            NOCTURNAL POLYSOMNOGRAM   REFERRING PHYSICIAN:   REFERRING PHYSICIAN:  Thornton Park. Daphine Deutscher, MD.   INDICATIONS FOR STUDY:  Hypersomnia with sleep apnea.   EPWORTH SLEEPINESS SCORE:  Epworth sleepiness score 3/24, BMI 54.8.  Weight 350 pounds, height 67 inches.  Neck 17 inches.   HOME MEDICATIONS:  Charted and reviewed.   SLEEP ARCHITECTURE:  Split study protocol.  During the diagnostic phase,  total sleep time 146 minutes with sleep efficiency 61.6%.  Stage I was  5.1%, stage II 77.1%, stage III 0.7%, REM 17.1% of total sleep time.  Sleep latency 41 minutes, REM latency 163 minutes, awake after sleep  onset 50 minutes, arousal index 47.7.   BEDTIME MEDICATION:  Coreg.   RESPIRATORY DATA:  Split study protocol.  Apnea/hypopnea index (AHI)  19.7 per hour.  A total of 48 events were scored including 1 obstructive  apnea and 47 hypopneas.  All events were recorded as none supine with  REM AHI 69.6 per hour.  CPAP was then titrated to 9 CWP, AHI 0.7 per  hour.  She wore a [employer], size small with heated  humidifier.   OXYGEN DATA:  Moderately loud snoring before CPAP with oxygen  desaturation to a nadir of 83%.  After CPAP control mean oxygen  saturation held 94.6% on room air.   CARDIAC DATA:  Sinus rhythm.   MOVEMENT/PARASOMNIA:  No significant movement disturbance.  No bathroom  trips.   IMPRESSION/RECOMMENDATIONS:  1. Moderate obstructive sleep apnea/hypopnea syndrome, AHI 19.7 per      hour with non supine events, moderately loud snoring and oxygen      desaturation to a nadir of 83%.  2. Successful CPAP titration to 9 CWP, AHI 0.7 per hour.  She  wore a      small ResMed Mirage Quattro mask with heated humidifier.      Clinton D. Maple Hudson, MD, [facility], FACP  Diplomate, Biomedical engineer of Sleep Medicine  Electronically Signed     CDY/MEDQ  D:  07/17/2008 09:81:19  T:  07/17/2008 20:39:33  Job:  [id number]

## 2010-06-09 NOTE — Letter (Signed)
May 01, 2008    Kristin Hubbard. Kristin Deutscher, MD  [address]  [place], [place] [address]   RE:  Kristin Hubbard, Kristin Hubbard  MRN:  [id number]  /  DOB:  06/16/1960   Dear Dr. Daphine Hubbard:   Kristin Hubbard is interested in lap band surgery , she has struggled with  obesity all of her life.  She also has reflux disease, depression,  anxiety, hypertension, hypothyroidism, multiple joint complaints, and  migraines.  In 2006, she was at a weight of 254.1, BMI of 54.83; in  2007, she was at a weight of 351.2 with a BMI of 54.38; in 2008, she was  at a weight of 357.0 with BMI of 55.9; in 2009, she weighed 337, BMI of  53.19; in 2010, she was at a weight of 348.4 with BMI 53.93.  She has  attempted and failed several weight loss programs.  She has tried [employer], this was in 2005.  Over a 73-month period of time, she lost 35  pounds and regained it all back.  She started the [employer] program 2 times  in 2000, over 6 months, lost 30 pounds and regained it all back.  She  has done low-calorie diet, high-protein diet, and body makeover and lost  weight and regained it.  The patient has done research on weight loss  surgery and I believe she is brave to take this step.  She understands  the life style changes that needs to be made before surgery and after.  I believe she will make a good candidate for lap band surgery.  Please  feel free to call me with any further questions.    Sincerely,      Kristin Perla, DO  Electronically Signed    Shawnie Dapper  DD: 05/01/2008  DT: 05/02/2008  Job #: [id number]

## 2010-07-03 ENCOUNTER — Encounter: Payer: Self-pay | Admitting: Family Medicine

## 2010-07-03 ENCOUNTER — Ambulatory Visit (INDEPENDENT_AMBULATORY_CARE_PROVIDER_SITE_OTHER): Payer: 59 | Admitting: Family Medicine

## 2010-07-03 VITALS — BP 136/82 | HR 88 | Temp 99.0°F | Wt 351.0 lb

## 2010-07-03 DIAGNOSIS — R350 Frequency of micturition: Secondary | ICD-10-CM

## 2010-07-03 DIAGNOSIS — R109 Unspecified abdominal pain: Secondary | ICD-10-CM

## 2010-07-03 LAB — CBC WITH DIFFERENTIAL/PLATELET
Basophils Relative: 0.4 % (ref 0.0–3.0)
Eosinophils Absolute: 0.3 10*3/uL (ref 0.0–0.7)
Lymphocytes Relative: 18.2 % (ref 12.0–46.0)
MCHC: 34.3 g/dL (ref 30.0–36.0)
Monocytes Relative: 7.1 % (ref 3.0–12.0)
Neutrophils Relative %: 71.9 % (ref 43.0–77.0)
RBC: 4.48 Mil/uL (ref 3.87–5.11)
WBC: 11.1 10*3/uL — ABNORMAL HIGH (ref 4.5–10.5)

## 2010-07-03 LAB — HEPATIC FUNCTION PANEL
Alkaline Phosphatase: 93 U/L (ref 39–117)
Bilirubin, Direct: 0.1 mg/dL (ref 0.0–0.3)
Total Protein: 7.4 g/dL (ref 6.0–8.3)

## 2010-07-03 LAB — BASIC METABOLIC PANEL
CO2: 28 mEq/L (ref 19–32)
Calcium: 8.9 mg/dL (ref 8.4–10.5)
Creatinine, Ser: 0.8 mg/dL (ref 0.4–1.2)
GFR: 80.55 mL/min (ref >60.00)

## 2010-07-03 LAB — POCT URINALYSIS DIPSTICK
Glucose, UA: NEGATIVE
Ketones, UA: NEGATIVE
Nitrite, UA: POSITIVE

## 2010-07-03 LAB — H. PYLORI ANTIBODY, IGG: H Pylori IgG: NEGATIVE

## 2010-07-03 MED ORDER — CIPROFLOXACIN HCL 500 MG PO TABS: 500.0000 mg | ORAL_TABLET | Freq: Two times a day (BID) | ORAL | Status: AC

## 2010-07-03 NOTE — Patient Instructions (Signed)
Urinary Tract Infection (UTI)   Infections of the urinary tract can start in several places. A bladder infection (cystitis), a kidney infection (pyelonephritis), and a prostate infection (prostatitis) are different types of urinary tract infections. They usually get better if treated with medicines (antibiotics) that kill germs. Take all the medicine until it is gone. You or your child may feel better in a few days, but TAKE ALL MEDICINE or the infection may not respond and may become more difficult to treat.   HOME CARE INSTRUCTIONS   Drink enough water and fluids to keep the urine clear or pale yellow. Cranberry juice is especially recommended, in addition to large amounts of water.   Avoid caffeine, tea, and carbonated beverages. They tend to irritate the bladder.   Alcohol may irritate the prostate.   Only take over-the-counter or prescription medicines for pain, discomfort, or fever as directed by your caregiver.   FINDING OUT THE RESULTS OF YOUR TEST   Not all test results are available during your visit. If your or your child's test results are not back during the visit, make an appointment with your caregiver to find out the results. Do not assume everything is normal if you have not heard from your caregiver or the medical facility. It is important for you to follow up on all test results.   TO PREVENT FURTHER INFECTIONS:   Empty the bladder often. Avoid holding urine for long periods of time.   After a bowel movement, women should cleanse from front to back. Use each tissue only once.   Empty the bladder before and after sexual intercourse.   SEEK MEDICAL CARE IF:   There is back pain.   You or your child has an oral temperature above 100.4.   Your baby is older than 3 months with a rectal temperature of 100.5º F (38.1° C) or higher for more than 1 day.   Your or your child's problems (symptoms) are no better in 3 days. Return sooner if you or your child is getting worse.   SEEK IMMEDIATE MEDICAL CARE IF:    There is severe back pain or lower abdominal pain.   You or your child develops chills.   You or your child has an oral temperature above 100.4, not controlled by medicine.   Your baby is older than 3 months with a rectal temperature of 102º F (38.9º C) or higher.   Your baby is 3 months old or younger with a rectal temperature of 100.4º F (38º C) or higher.   There is nausea or vomiting.   There is continued burning or discomfort with urination.   MAKE SURE YOU:   Understand these instructions.   Will watch this condition.   Will get help right away if you or your child is not doing well or gets worse.   Document Released: 10/18/2004 Document Re-Released: 04/04/2009   Exit[employer]® Patient Information ©2011 [employer].

## 2010-07-03 NOTE — Progress Notes (Signed)
  Subjective:    Kristin Hubbard is a 50 y.o. female who complains of burning with urination and frequency. She has had symptoms for 4 days. Patient also complains of back pain. Patient denies congestion, cough, fever, headache, sorethroat and vaginal discharge. Patient does not have a history of recurrent UTI. Patient does not have a history of pyelonephritis.   The following portions of the patient's history were reviewed and updated as appropriate: allergies, current medications, past family history, past medical history, past social history, past surgical history and problem list.  Review of Systems Pertinent items are noted in HPI.    Objective:    BP 136/82  Pulse 88  Temp(Src) 99 F (37.2 C) (Oral)  Wt 351 lb (159.213 kg)  SpO2 98% General appearance: alert, cooperative, appears stated age and no distress Abdomen: abnormal findings:  obese and LUQ and suprapubic--+ midepigastric tenderness and R and L upper quadrant pain Lymph nodes: Cervical, supraclavicular, and axillary nodes normal.  Laboratory:  Urine dipstick: large for hemoglobin, large for leukocyte esterase, large for nitrites, large for protein and large for urobilinogen.   Micro exam: not done.    Assessment:    UTI     Plan:    Medications: ciprofloxacin. Maintain adequate hydration. Follow up if symptoms not improving, and as needed.

## 2010-07-04 ENCOUNTER — Telehealth: Payer: Self-pay | Admitting: *Deleted

## 2010-07-04 DIAGNOSIS — R109 Unspecified abdominal pain: Secondary | ICD-10-CM

## 2010-07-04 NOTE — Telephone Encounter (Signed)
Pt still c/o severe abdominal pain and nausea. Please advise   Pt aware of lab results.

## 2010-07-04 NOTE — Telephone Encounter (Signed)
See labs---still waiting for urine culture

## 2010-07-04 NOTE — Telephone Encounter (Signed)
Discuss with patient, advised if symptom continue to increase needs to be seen in ED, Pt ok.

## 2010-07-04 NOTE — Telephone Encounter (Signed)
Get CT abd and pelvis ----r/o kidney stones

## 2010-07-05 ENCOUNTER — Telehealth: Payer: Self-pay | Admitting: Family Medicine

## 2010-07-05 ENCOUNTER — Other Ambulatory Visit: Payer: 59

## 2010-07-05 LAB — URINE CULTURE: Colony Count: >=100000

## 2010-08-11 ENCOUNTER — Telehealth: Payer: Self-pay

## 2010-08-11 MED ORDER — FLUCONAZOLE 150 MG PO TABS: ORAL_TABLET | ORAL | Status: DC

## 2010-08-11 NOTE — Telephone Encounter (Signed)
Rx faxed and patient aware.     KP 

## 2010-08-11 NOTE — Telephone Encounter (Signed)
Diflucan 150 mg  #2  1 po qd x1  May repeat in 3 days prn---ov if it does not resolve

## 2010-08-11 NOTE — Telephone Encounter (Signed)
Call from patient and she stated she has a yeast infection and wanted to know if we could call in a diflucan. I advised patient we don't normally call in Diflucan, she may have to come in but I would check with Dr.Lowne this time since she can not make it in fr an appt. She stated she knew for sure it was a yeast infection and thinks she got it from sitting in a hot car traveling last week.   Please advise    KP

## 2010-11-08 NOTE — Telephone Encounter (Signed)
IN REFERENCE TO CT ABD/PELVIS W/O, I CALLED TO INFORM PT OF CT, patient declined.

## 2010-11-25 ENCOUNTER — Other Ambulatory Visit: Payer: Self-pay | Admitting: Family Medicine

## 2011-01-19 ENCOUNTER — Ambulatory Visit (INDEPENDENT_AMBULATORY_CARE_PROVIDER_SITE_OTHER): Payer: BC Managed Care – PPO | Admitting: Family Medicine

## 2011-01-19 ENCOUNTER — Encounter: Payer: Self-pay | Admitting: Family Medicine

## 2011-01-19 VITALS — BP 130/92 | HR 92 | Temp 98.7°F | Ht 67.0 in | Wt 355.4 lb

## 2011-01-19 DIAGNOSIS — J40 Bronchitis, not specified as acute or chronic: Secondary | ICD-10-CM

## 2011-01-19 MED ORDER — BENZONATATE 200 MG PO CAPS: 200.0000 mg | ORAL_CAPSULE | Freq: Three times a day (TID) | ORAL | Status: AC | PRN

## 2011-01-19 MED ORDER — GUAIFENESIN-CODEINE 100-10 MG/5ML PO SYRP: 5.0000 mL | ORAL_SOLUTION | Freq: Three times a day (TID) | ORAL | Status: AC | PRN

## 2011-01-19 MED ORDER — AZITHROMYCIN 250 MG PO TABS: 250.0000 mg | ORAL_TABLET | Freq: Every day | ORAL | Status: AC

## 2011-01-19 NOTE — Assessment & Plan Note (Signed)
Pt's sxs and PE consistent w/ bronchitis.  Given smoking hx start abx.  Reviewed supportive care and red flags that should prompt return.  Pt expressed understanding and is in agreement w/ plan.

## 2011-01-19 NOTE — Progress Notes (Signed)
  Subjective:    Patient ID: Kristin Hubbard, female    DOB: 1960-03-28, 50 y.o.   MRN: [id number]  HPI URI- sxs started 10 days ago w/ severe cough after heavy smoking on long drive to [place].  Yesterday developed body aches and fever.  + nasal congestion.  No ear pain, sinus pain.  Cough is dry.  + sick contacts.     Review of Systems For ROS see HPI     Objective:   Physical Exam  Vitals reviewed. Constitutional: She appears well-developed and well-nourished. No distress.  HENT:  Head: Normocephalic and atraumatic.       TMs normal bilaterally Mild nasal congestion Throat w/out erythema, edema, or exudate  Eyes: Conjunctivae and EOM are normal. Pupils are equal, round, and reactive to light.  Neck: Normal range of motion. Neck supple.  Cardiovascular: Normal rate, regular rhythm, normal heart sounds and intact distal pulses.   No murmur heard. Pulmonary/Chest: Effort normal and breath sounds normal. No respiratory distress. She has no wheezes.       + hacking cough  Lymphadenopathy:    She has no cervical adenopathy.          Assessment & Plan:

## 2011-01-19 NOTE — Patient Instructions (Signed)
This is a bronchitis Start the Azithromycin as directed Use the cough meds as needed Drink plenty of fluids Mucinex to thin your congestion REST! Hang in there!! Happy New Year!

## 2011-03-19 ENCOUNTER — Other Ambulatory Visit: Payer: Self-pay | Admitting: Family Medicine

## 2011-04-27 ENCOUNTER — Encounter: Payer: BC Managed Care – PPO | Admitting: Family Medicine

## 2011-04-30 ENCOUNTER — Encounter: Payer: BC Managed Care – PPO | Admitting: Family Medicine

## 2011-06-28 ENCOUNTER — Encounter: Payer: BC Managed Care – PPO | Admitting: Family Medicine

## 2011-08-16 ENCOUNTER — Other Ambulatory Visit: Payer: Self-pay | Admitting: Family Medicine

## 2011-08-22 ENCOUNTER — Encounter: Payer: Self-pay | Admitting: Family Medicine

## 2011-08-22 ENCOUNTER — Ambulatory Visit (INDEPENDENT_AMBULATORY_CARE_PROVIDER_SITE_OTHER): Payer: BC Managed Care – PPO | Admitting: Family Medicine

## 2011-08-22 VITALS — BP 136/92 | HR 85 | Temp 98.0°F | Wt 354.2 lb

## 2011-08-22 DIAGNOSIS — M25569 Pain in unspecified knee: Secondary | ICD-10-CM

## 2011-08-22 DIAGNOSIS — I10 Essential (primary) hypertension: Secondary | ICD-10-CM

## 2011-08-22 DIAGNOSIS — R319 Hematuria, unspecified: Secondary | ICD-10-CM

## 2011-08-22 DIAGNOSIS — E785 Hyperlipidemia, unspecified: Secondary | ICD-10-CM

## 2011-08-22 DIAGNOSIS — E039 Hypothyroidism, unspecified: Secondary | ICD-10-CM

## 2011-08-22 LAB — POCT URINALYSIS DIPSTICK
Bilirubin, UA: NEGATIVE
Nitrite, UA: NEGATIVE
Spec Grav, UA: 1.02
pH, UA: 6

## 2011-08-22 LAB — LIPID PANEL
Cholesterol: 230 mg/dL — ABNORMAL HIGH (ref 0–200)
Total CHOL/HDL Ratio: 7
Triglycerides: 143 mg/dL (ref 0.0–149.0)

## 2011-08-22 LAB — HEPATIC FUNCTION PANEL
ALT: 22 U/L (ref 0–35)
Albumin: 3.8 g/dL (ref 3.5–5.2)
Total Bilirubin: 0.4 mg/dL (ref 0.3–1.2)

## 2011-08-22 LAB — BASIC METABOLIC PANEL
BUN: 12 mg/dL (ref 6–23)
Creatinine, Ser: 0.6 mg/dL (ref 0.4–1.2)
GFR: 103.74 mL/min (ref >60.00)
Glucose, Bld: 124 mg/dL — ABNORMAL HIGH (ref 70–99)

## 2011-08-22 LAB — LDL CHOLESTEROL, DIRECT: Direct LDL: 173.6 mg/dL

## 2011-08-22 MED ORDER — LOSARTAN POTASSIUM 50 MG PO TABS: 50.0000 mg | ORAL_TABLET | Freq: Every day | ORAL | Status: DC

## 2011-08-22 MED ORDER — OXYCODONE-ACETAMINOPHEN 5-325 MG PO TABS: 1.0000 | ORAL_TABLET | ORAL | Status: DC | PRN

## 2011-08-22 MED ORDER — SIMVASTATIN 40 MG PO TABS: 40.0000 mg | ORAL_TABLET | Freq: Every day | ORAL | Status: DC

## 2011-08-22 NOTE — Addendum Note (Signed)
Addended by: Silvio Pate D on: 08/22/2011 11:17 AM   Modules accepted: Orders

## 2011-08-22 NOTE — Progress Notes (Signed)
  Subjective:    Patient here for follow-up of elevated blood pressure.  She is exercising and is adherent to a low-salt diet.  Blood pressure is not checked at home. Cardiac symptoms: none. Patient denies: chest pain, chest pressure/discomfort, claudication, dyspnea, exertional chest pressure/discomfort, fatigue, irregular heart beat, lower extremity edema, near-syncope, orthopnea, palpitations, paroxysmal nocturnal dyspnea, syncope and tachypnea. Cardiovascular risk factors: dyslipidemia, hypertension and obesity (BMI >= 30 kg/m2). Use of agents associated with hypertension: none. History of target organ damage: none.  The following portions of the patient's history were reviewed and updated as appropriate: allergies, current medications, past family history, past medical history, past social history, past surgical history and problem list.  Review of Systems Pertinent items are noted in HPI.     Objective:    BP 136/92  Pulse 85  Temp 98 F (36.7 C) (Oral)  Wt 354 lb 3.2 oz (160.664 kg)  SpO2 97% General appearance: alert, cooperative, appears stated age and no distress Lungs: clear to auscultation bilaterally Heart: S1, S2 normal Extremities: extremities normal, atraumatic, no cyanosis or edema    Assessment:    Hypertension, stage 1 . Evidence of target organ damage: none.   hyperlipidemia--- pt has been off med Urinary incontinence---check UA, vesicare 10 mg  tob abuse-- sample nicotrol inhaler given Plan:    Medication: increase to coreg 25. Dietary sodium restriction. Regular aerobic exercise. Follow up: 3 weeks and as needed.

## 2011-08-22 NOTE — Patient Instructions (Addendum)
Hypertension As your heart beats, it forces blood through your arteries. This force is your blood pressure. If the pressure is too high, it is called hypertension (HTN) or high blood pressure. HTN is dangerous because you may have it and not know it. High blood pressure may mean that your heart has to work harder to pump blood. Your arteries may be narrow or stiff. The extra work puts you at risk for heart disease, stroke, and other problems.  Blood pressure consists of two numbers, a higher number over a lower, 110/72, for example. It is stated as "110 over 72." The ideal is below 120 for the top number (systolic) and under 80 for the bottom (diastolic). Write down your blood pressure today. You should pay close attention to your blood pressure if you have certain conditions such as:  Heart failure.   Prior heart attack.   Diabetes   Chronic kidney disease.   Prior stroke.   Multiple risk factors for heart disease.  To see if you have HTN, your blood pressure should be measured while you are seated with your arm held at the level of the heart. It should be measured at least twice. A one-time elevated blood pressure reading (especially in the Emergency Department) does not mean that you need treatment. There may be conditions in which the blood pressure is different between your right and left arms. It is important to see your caregiver soon for a recheck. Most people have essential hypertension which means that there is not a specific cause. This type of high blood pressure may be lowered by changing lifestyle factors such as:  Stress.   Smoking.   Lack of exercise.   Excessive weight.   Drug/tobacco/alcohol use.   Eating less salt.  Most people do not have symptoms from high blood pressure until it has caused damage to the body. Effective treatment can often prevent, delay or reduce that damage. TREATMENT  When a cause has been identified, treatment for high blood pressure is  directed at the cause. There are a large number of medications to treat HTN. These fall into several categories, and your caregiver will help you select the medicines that are best for you. Medications may have side effects. You should review side effects with your caregiver. If your blood pressure stays high after you have made lifestyle changes or started on medicines,   Your medication(s) may need to be changed.   Other problems may need to be addressed.   Be certain you understand your prescriptions, and know how and when to take your medicine.   Be sure to follow up with your caregiver within the time frame advised (usually within two weeks) to have your blood pressure rechecked and to review your medications.   If you are taking more than one medicine to lower your blood pressure, make sure you know how and at what times they should be taken. Taking two medicines at the same time can result in blood pressure that is too low.  SEEK IMMEDIATE MEDICAL CARE IF:  You develop a severe headache, blurred or changing vision, or confusion.   You have unusual weakness or numbness, or a faint feeling.   You have severe chest or abdominal pain, vomiting, or breathing problems.  MAKE SURE YOU:   Understand these instructions.   Will watch your condition.   Will get help right away if you are not doing well or get worse.  Document Released: 01/08/2005 Document Revised: 12/28/2010 Document Reviewed:   08/29/2007 [employer] Patient Information 2012 [place], [place].  Smoking Cessation, Tips for Success YOU CAN QUIT SMOKING If you are ready to quit smoking, congratulations! You have chosen to help yourself be healthier. Cigarettes bring nicotine, tar, carbon monoxide, and other irritants into your body. Your lungs, heart, and blood vessels will be able to work better without these poisons. There are many different ways to quit smoking. Nicotine gum, nicotine patches, a nicotine inhaler, or nicotine  nasal spray can help with physical craving. Hypnosis, support groups, and medicines help break the habit of smoking. Here are some tips to help you quit for good. Throw away all cigarettes.  Clean and remove all ashtrays from your home, work, and car.  On a card, write down your reasons for quitting. Carry the card with you and read it when you get the urge to smoke.  Cleanse your body of nicotine. Drink enough water and fluids to keep your urine clear or pale yellow. Do this after quitting to flush the nicotine from your body.  Learn to predict your moods. Do not let a bad situation be your excuse to have a cigarette. Some situations in your life might tempt you into wanting a cigarette.  Never have "just one" cigarette. It leads to wanting another and another. Remind yourself of your decision to quit.  Change habits associated with smoking. If you smoked while driving or when feeling stressed, try other activities to replace smoking. Stand up when drinking your coffee. Brush your teeth after eating. Sit in a different chair when you read the paper. Avoid alcohol while trying to quit, and try to drink fewer caffeinated beverages. Alcohol and caffeine may urge you to smoke.  Avoid foods and drinks that can trigger a desire to smoke, such as sugary or spicy foods and alcohol.  Ask people who smoke not to smoke around you.  Have something planned to do right after eating or having a cup of coffee. Take a walk or exercise to perk you up. This will help to keep you from overeating.  Try a relaxation exercise to calm you down and decrease your stress. Remember, you may be tense and nervous for the first 2 weeks after you quit, but this will pass.  Find new activities to keep your hands busy. Play with a pen, coin, or rubber band. Doodle or draw things on paper.  Brush your teeth right after eating. This will help cut down on the craving for the taste of tobacco after meals. You can try mouthwash, too.  Use  oral substitutes, such as lemon drops, carrots, a cinnamon stick, or chewing gum, in place of cigarettes. Keep them handy so they are available when you have the urge to smoke.  When you have the urge to smoke, try deep breathing.  Designate your home as a nonsmoking area.  If you are a heavy smoker, ask your caregiver about a prescription for nicotine chewing gum. It can ease your withdrawal from nicotine.  Reward yourself. Set aside the cigarette money you save and buy yourself something nice.  Look for support from others. Join a support group or smoking cessation program. Ask someone at home or at work to help you with your plan to quit smoking.  Always ask yourself, "Do I need this cigarette or is this just a reflex?" Tell yourself, "Today, I choose not to smoke," or "I do not want to smoke." You are reminding yourself of your decision to quit, even if you do  smoke a cigarette.  HOW WILL I FEEL WHEN I QUIT SMOKING? The benefits of not smoking start within days of quitting.  You may have symptoms of withdrawal because your body is used to nicotine (the addictive substance in cigarettes). You may crave cigarettes, be irritable, feel very hungry, cough often, get headaches, or have difficulty concentrating.  The withdrawal symptoms are only temporary. They are strongest when you first quit but will go away within 10 to 14 days.  When withdrawal symptoms occur, stay in control. Think about your reasons for quitting. Remind yourself that these are signs that your body is healing and getting used to being without cigarettes.  Remember that withdrawal symptoms are easier to treat than the major diseases that smoking can cause.  Even after the withdrawal is over, expect periodic urges to smoke. However, these cravings are generally short-lived and will go away whether you smoke or not. Do not smoke!  If you relapse and smoke again, do not lose hope. Most smokers quit 3 times before they are successful.    If you relapse, do not give up! Plan ahead and think about what you will do the next time you get the urge to smoke.  LIFE AS A NONSMOKER: MAKE IT FOR A MONTH, MAKE IT FOR LIFE Day 1: Hang this page where you will see it every day. Day 2: Get rid of all ashtrays, matches, and lighters. Day 3: Drink water. Breathe deeply between sips. Day 4: Avoid places with smoke-filled air, such as bars, clubs, or the smoking section of restaurants. Day 5: Keep track of how much money you save by not smoking. Day 6: Avoid boredom. Keep a good book with you or go to the movies. Day 7: Reward yourself! One week without smoking! Day 8: Make a dental appointment to get your teeth cleaned. Day 9: Decide how you will turn down a cigarette before it is offered to you. Day 10: Review your reasons for quitting. Day 11: Distract yourself. Stay active to keep your mind off smoking and to relieve tension. Take a walk, exercise, read a book, do a crossword puzzle, or try a new hobby. Day 12: Exercise. Get off the bus before your stop or use stairs instead of escalators. Day 13: Call on friends for support and encouragement. Day 14: Reward yourself! Two weeks without smoking! Day 15: Practice deep breathing exercises. Day 16: Bet a friend that you can stay a nonsmoker. Day 17: Ask to sit in nonsmoking sections of restaurants. Day 18: Hang up "No Smoking" signs. Day 19: Think of yourself as a nonsmoker. Day 20: Each morning, tell yourself you will not smoke. Day 21: Reward yourself! Three weeks without smoking! Day 22: Think of smoking in negative ways. Remember how it stains your teeth, gives you bad breath, and leaves you short of breath. Day 23: Eat a nutritious breakfast. Day 24:Do not relive your days as a smoker. Day 25: Hold a pencil in your hand when talking on the telephone. Day 26: Tell all your friends you do not smoke. Day 27: Think about how much better food tastes. Day 28: Remember, one cigarette is  one too many. Day 29: Take up a hobby that will keep your hands busy. Day 30: Congratulations! One month without smoking! Give yourself a big reward. Your caregiver can direct you to community resources or hospitals for support, which may include: Group support.  Education.  Hypnosis.  Subliminal therapy.  Document Released: 10/07/2003 Document Revised: 12/28/2010  Document Reviewed: 10/25/2008 [facility] Patient Information 2012 [place], [place].;

## 2011-08-27 LAB — URINE CULTURE: Colony Count: >=100000

## 2011-08-29 ENCOUNTER — Other Ambulatory Visit: Payer: Self-pay

## 2011-08-29 MED ORDER — CIPROFLOXACIN HCL 500 MG PO TABS: 500.0000 mg | ORAL_TABLET | Freq: Two times a day (BID) | ORAL | Status: AC

## 2011-08-31 ENCOUNTER — Encounter: Payer: Self-pay | Admitting: Family Medicine

## 2011-09-11 ENCOUNTER — Encounter: Payer: Self-pay | Admitting: Family Medicine

## 2011-10-01 ENCOUNTER — Telehealth: Payer: Self-pay | Admitting: Family Medicine

## 2011-10-01 NOTE — Telephone Encounter (Signed)
Pls advise.  

## 2011-10-01 NOTE — Telephone Encounter (Signed)
Pt was on carvedilol (COREG) 25 MG tablet previously would you like for her to go back to this or change to lisinopril.Please advise   Pt notes that she has coreg left and will go back to that but if a new Rx needs to be sent then let her know.

## 2011-10-01 NOTE — Telephone Encounter (Signed)
Caller: Sherina/Patient; PCP: Lelon Perla.; CB#: [phone]; Call regarding High Blood Pressure. Caller reports she was seen in the office about a month ago and was given Losartin, replacing Lisinopril. Caller reports she feels "weird" and can not really describe how she feels,  mild headaches at intervals.  BP last week was 160/100 (Thurs 9/5)  when she had it checked at work. She has not checked it otherwise. Symptoms for past 5 days. Caller denies chest pain or sob. Caller offered an appointment to be seen, which she declines. Stating, "I work in [employer] office and it's very hard to get off." Caller would like note sent to PCP to advise of issues and to ask if she can go back to her Lisinopril until she can make appointment and get back in to see PCP. Caller can be reached at above # until 5pm and at cell of [phone] after 5.

## 2011-10-01 NOTE — Telephone Encounter (Signed)
Ok to change back to lisinopril and make f/u appointment in 2-3 weeks

## 2011-10-02 NOTE — Telephone Encounter (Signed)
Spoke with patient and she agreed to restart the Carvedilol 25 mg, she has some at home and did not need and Rx faxed. She will call back to schedule the follow up apt.    KP

## 2011-10-02 NOTE — Telephone Encounter (Signed)
I reviewed centricity and med list and cannot see where Pt has ever been on lisinopril. Marland KitchenPlease advise

## 2011-10-02 NOTE — Telephone Encounter (Signed)
She is still taking coreg according to her med list----  We changed lisinopril to losartan , I thought

## 2011-10-02 NOTE — Telephone Encounter (Signed)
Ok to switch to carvedilol---- ov 2-3 weeks

## 2011-10-09 ENCOUNTER — Other Ambulatory Visit: Payer: Self-pay | Admitting: Family Medicine

## 2011-10-31 ENCOUNTER — Encounter: Payer: Self-pay | Admitting: Family Medicine

## 2011-10-31 ENCOUNTER — Ambulatory Visit (INDEPENDENT_AMBULATORY_CARE_PROVIDER_SITE_OTHER): Payer: BC Managed Care – PPO | Admitting: Family Medicine

## 2011-10-31 VITALS — BP 132/76 | HR 83 | Temp 97.9°F | Wt 345.0 lb

## 2011-10-31 DIAGNOSIS — I10 Essential (primary) hypertension: Secondary | ICD-10-CM

## 2011-10-31 DIAGNOSIS — E785 Hyperlipidemia, unspecified: Secondary | ICD-10-CM | POA: Insufficient documentation

## 2011-10-31 LAB — LIPID PANEL
Cholesterol: 137 mg/dL (ref 0–200)
VLDL: 27.4 mg/dL (ref 0.0–40.0)

## 2011-10-31 LAB — HEPATIC FUNCTION PANEL
AST: 21 U/L (ref 0–37)
Albumin: 3.7 g/dL (ref 3.5–5.2)
Alkaline Phosphatase: 74 U/L (ref 39–117)
Total Protein: 7.1 g/dL (ref 6.0–8.3)

## 2011-10-31 LAB — BASIC METABOLIC PANEL
BUN: 13 mg/dL (ref 6–23)
CO2: 24 mEq/L (ref 19–32)
Calcium: 8.9 mg/dL (ref 8.4–10.5)
GFR: 95.04 mL/min (ref >60.00)
Glucose, Bld: 129 mg/dL — ABNORMAL HIGH (ref 70–99)
Potassium: 3.9 mEq/L (ref 3.5–5.1)
Sodium: 138 mEq/L (ref 135–145)

## 2011-10-31 NOTE — Assessment & Plan Note (Signed)
Check labs con't meds 

## 2011-10-31 NOTE — Patient Instructions (Addendum)

## 2011-10-31 NOTE — Progress Notes (Signed)
  Subjective:    Patient here for follow-up of elevated blood pressure.  She is exercising and is adherent to a low-salt diet. The patient has been working out with a trainer--2x a week and 1 other time at home.  She has lost about 10 lbs and after 4 1/2 years she has no knee or back pain!!!  Blood pressure is well controlled at home. Cardiac symptoms: none. Patient denies: chest pain, chest pressure/discomfort, claudication, dyspnea, exertional chest pressure/discomfort, fatigue, irregular heart beat, lower extremity edema, near-syncope, orthopnea, palpitations, paroxysmal nocturnal dyspnea, syncope and tachypnea. Cardiovascular risk factors: dyslipidemia, hypertension and obesity (BMI >= 30 kg/m2). Use of agents associated with hypertension: none. History of target organ damage: none.  We also need to check her cholesterol.  No new complaints.  The following portions of the patient's history were reviewed and updated as appropriate: allergies, current medications, past family history, past medical history, past social history, past surgical history and problem list.  Review of Systems Pertinent items are noted in HPI.     Objective:    BP 132/76  Pulse 83  Temp 97.9 F (36.6 C) (Oral)  Wt 345 lb (156.491 kg)  SpO2 97% General appearance: alert, cooperative, appears stated age and no distress Lungs: clear to auscultation bilaterally Heart: S1, S2 normal Extremities: extremities normal, atraumatic, no cyanosis or edema    Assessment:    Hypertension, normal blood pressure . Evidence of target organ damage: none.    Plan:    Medication: no change. Screening labs for initial evaluation: lipid panel and potassium. Dietary sodium restriction. Regular aerobic exercise. Check blood pressures 2-3 times weekly and record. Follow up: 6 months and as needed.

## 2011-11-18 ENCOUNTER — Other Ambulatory Visit: Payer: Self-pay | Admitting: Family Medicine

## 2012-01-25 ENCOUNTER — Ambulatory Visit: Payer: BC Managed Care – PPO | Admitting: Family Medicine

## 2012-01-28 ENCOUNTER — Encounter: Payer: Self-pay | Admitting: *Deleted

## 2012-01-29 ENCOUNTER — Ambulatory Visit (INDEPENDENT_AMBULATORY_CARE_PROVIDER_SITE_OTHER): Payer: BC Managed Care – PPO | Admitting: Family Medicine

## 2012-01-29 ENCOUNTER — Encounter: Payer: Self-pay | Admitting: Family Medicine

## 2012-01-29 VITALS — BP 134/72 | HR 95 | Temp 97.6°F | Wt 315.4 lb

## 2012-01-29 DIAGNOSIS — R21 Rash and other nonspecific skin eruption: Secondary | ICD-10-CM

## 2012-01-29 MED ORDER — CARVEDILOL 25 MG PO TABS: 25.0000 mg | ORAL_TABLET | Freq: Two times a day (BID) | ORAL | Status: DC

## 2012-01-29 MED ORDER — CLOTRIMAZOLE-BETAMETHASONE 1-0.05 % EX CREA: TOPICAL_CREAM | Freq: Two times a day (BID) | CUTANEOUS | Status: DC

## 2012-01-29 NOTE — Progress Notes (Signed)
  Subjective:     Kristin Hubbard is a 52 y.o. female who presents for evaluation of a rash involving the forearm. Rash started a few weeks ago. Lesions are pink, and raised in texture. Rash has not changed over time. Rash causes no discomfort. Associated symptoms: none. Patient denies: abdominal pain, arthralgia, congestion, cough, crankiness, decrease in appetite, decrease in energy level, fever, headache, irritability, myalgia, nausea, sore throat and vomiting. Patient has not had contacts with similar rash. Patient has not had new exposures (soaps, lotions, laundry detergents, foods, medications, plants, insects or animals).  The following portions of the patient's history were reviewed and updated as appropriate: allergies, current medications, past family history, past medical history, past social history, past surgical history and problem list.  Review of Systems Pertinent items are noted in HPI.    Objective:    BP 134/72  Pulse 95  Temp 97.6 F (36.4 C) (Oral)  Wt 315 lb 6.4 oz (143.065 kg)  SpO2 96% General:  alert, cooperative, appears stated age and no distress  Skin:  erythema noted on extremities, papules noted on L forearm and plaque noted on extremities--ext surface R elbow     Assessment:L     rash    Plan:    Medications: lotrisone. verbal and written patient instruction given. Follow up in 2 weeks.

## 2012-01-29 NOTE — Patient Instructions (Signed)
Rash  A rash is a change in the color or texture of your skin. There are many different types of rashes. You may have other problems that accompany your rash.  CAUSES     Infections.   Allergic reactions. This can include allergies to pets or foods.   Certain medicines.   Exposure to certain chemicals, soaps, or cosmetics.   Heat.   Exposure to poisonous plants.   Tumors, both cancerous and noncancerous.  SYMPTOMS     Redness.   Scaly skin.   Itchy skin.   Dry or cracked skin.   Bumps.   Blisters.   Pain.  DIAGNOSIS   Your caregiver may do a physical exam to determine what type of rash you have. A skin sample (biopsy) may be taken and examined under a microscope.  TREATMENT    Treatment depends on the type of rash you have. Your caregiver may prescribe certain medicines. For serious conditions, you may need to see a skin doctor (dermatologist).  HOME CARE INSTRUCTIONS     Avoid the substance that caused your rash.   Do not scratch your rash. This can cause infection.   You may take cool baths to help stop itching.   Only take over-the-counter or prescription medicines as directed by your caregiver.   Keep all follow-up appointments as directed by your caregiver.  SEEK IMMEDIATE MEDICAL CARE IF:   You have increasing pain, swelling, or redness.   You have a fever.   You have new or severe symptoms.   You have body aches, diarrhea, or vomiting.   Your rash is not better after 3 days.  MAKE SURE YOU:   Understand these instructions.   Will watch your condition.   Will get help right away if you are not doing well or get worse.  Document Released: 12/29/2001 Document Revised: 04/02/2011 Document Reviewed: 10/23/2010  [employer] Patient Information 2013 [employer].

## 2012-02-07 ENCOUNTER — Telehealth: Payer: Self-pay | Admitting: Family Medicine

## 2012-02-07 NOTE — Telephone Encounter (Signed)
msg left to call the office     KP 

## 2012-02-07 NOTE — Telephone Encounter (Signed)
Please advise      KP 

## 2012-02-07 NOTE — Telephone Encounter (Signed)
She can get lotrimen ultra otc and send in westcort cream 15 g  Apply bid for 2 weeks

## 2012-02-07 NOTE — Telephone Encounter (Signed)
Patient called to inform Dr. Laury Axon that the following medication she prescribed, clotrimazole-betamethasone (LOTRISONE) cream, was way too expensive.  Is there an alternative please that you can send to [facility] pharmacy on [address]?

## 2012-02-08 MED ORDER — HYDROCORTISONE VALERATE 0.2 % EX OINT: TOPICAL_OINTMENT | CUTANEOUS | Status: DC

## 2012-03-08 ENCOUNTER — Other Ambulatory Visit: Payer: Self-pay

## 2012-04-14 ENCOUNTER — Ambulatory Visit (INDEPENDENT_AMBULATORY_CARE_PROVIDER_SITE_OTHER): Payer: BC Managed Care – PPO | Admitting: Family Medicine

## 2012-04-14 ENCOUNTER — Encounter: Payer: Self-pay | Admitting: Family Medicine

## 2012-04-14 VITALS — BP 126/72 | HR 70 | Temp 97.7°F | Ht 67.0 in | Wt 297.0 lb

## 2012-04-14 DIAGNOSIS — E785 Hyperlipidemia, unspecified: Secondary | ICD-10-CM

## 2012-04-14 DIAGNOSIS — Z1239 Encounter for other screening for malignant neoplasm of breast: Secondary | ICD-10-CM

## 2012-04-14 DIAGNOSIS — E039 Hypothyroidism, unspecified: Secondary | ICD-10-CM

## 2012-04-14 DIAGNOSIS — Z0000 Encounter for general adult medical examination without abnormal findings: Secondary | ICD-10-CM

## 2012-04-14 DIAGNOSIS — I10 Essential (primary) hypertension: Secondary | ICD-10-CM

## 2012-04-14 DIAGNOSIS — Z1231 Encounter for screening mammogram for malignant neoplasm of breast: Secondary | ICD-10-CM

## 2012-04-14 LAB — CBC WITH DIFFERENTIAL/PLATELET
Eosinophils Absolute: 0.2 10*3/uL (ref 0.0–0.7)
MCHC: 35.2 g/dL (ref 30.0–36.0)
MCV: 95.8 fl (ref 78.0–100.0)
Monocytes Absolute: 0.5 10*3/uL (ref 0.1–1.0)
Neutrophils Relative %: 63.3 % (ref 43.0–77.0)
Platelets: 160 10*3/uL (ref 150.0–400.0)

## 2012-04-14 LAB — HEPATIC FUNCTION PANEL
ALT: 18 U/L (ref 0–35)
AST: 14 U/L (ref 0–37)
Bilirubin, Direct: 0 mg/dL (ref 0.0–0.3)
Total Bilirubin: 0.4 mg/dL (ref 0.3–1.2)

## 2012-04-14 LAB — LIPID PANEL
Cholesterol: 147 mg/dL (ref 0–200)
Total CHOL/HDL Ratio: 5
Triglycerides: 95 mg/dL (ref 0.0–149.0)

## 2012-04-14 LAB — BASIC METABOLIC PANEL
CO2: 25 mEq/L (ref 19–32)
Chloride: 103 mEq/L (ref 96–112)
Sodium: 136 mEq/L (ref 135–145)

## 2012-04-14 LAB — TSH: TSH: 1.71 u[IU]/mL (ref 0.35–5.50)

## 2012-04-14 NOTE — Assessment & Plan Note (Signed)
con't meds  Check labs 

## 2012-04-14 NOTE — Patient Instructions (Addendum)
Preventive Care for Adults, Female A healthy lifestyle and preventive care can promote health and wellness. Preventive health guidelines for women include the following key practices.  A routine yearly physical is a good way to check with your caregiver about your health and preventive screening. It is a chance to share any concerns and updates on your health, and to receive a thorough exam.  Visit your dentist for a routine exam and preventive care every 6 months. Brush your teeth twice a day and floss once a day. Good oral hygiene prevents tooth decay and gum disease.  The frequency of eye exams is based on your age, health, family medical history, use of contact lenses, and other factors. Follow your caregiver's recommendations for frequency of eye exams.  Eat a healthy diet. Foods like vegetables, fruits, whole grains, low-fat dairy products, and lean protein foods contain the nutrients you need without too many calories. Decrease your intake of foods high in solid fats, added sugars, and salt. Eat the right amount of calories for you.Get information about a proper diet from your caregiver, if necessary.  Regular physical exercise is one of the most important things you can do for your health. Most adults should get at least 150 minutes of moderate-intensity exercise (any activity that increases your heart rate and causes you to sweat) each week. In addition, most adults need muscle-strengthening exercises on 2 or more days a week.  Maintain a healthy weight. The body mass index (BMI) is a screening tool to identify possible weight problems. It provides an estimate of body fat based on height and weight. Your caregiver can help determine your BMI, and can help you achieve or maintain a healthy weight.For adults 20 years and older:  A BMI below 18.5 is considered underweight.  A BMI of 18.5 to 24.9 is normal.  A BMI of 25 to 29.9 is considered overweight.  A BMI of 30 and above is  considered obese.  Maintain normal blood lipids and cholesterol levels by exercising and minimizing your intake of saturated fat. Eat a balanced diet with plenty of fruit and vegetables. Blood tests for lipids and cholesterol should begin at age 20 and be repeated every 5 years. If your lipid or cholesterol levels are high, you are over 50, or you are at high risk for heart disease, you may need your cholesterol levels checked more frequently.Ongoing high lipid and cholesterol levels should be treated with medicines if diet and exercise are not effective.  If you smoke, find out from your caregiver how to quit. If you do not use tobacco, do not start.  If you are pregnant, do not drink alcohol. If you are breastfeeding, be very cautious about drinking alcohol. If you are not pregnant and choose to drink alcohol, do not exceed 1 drink per day. One drink is considered to be 12 ounces (355 mL) of beer, 5 ounces (148 mL) of wine, or 1.5 ounces (44 mL) of liquor.  Avoid use of street drugs. Do not share needles with anyone. Ask for help if you need support or instructions about stopping the use of drugs.  High blood pressure causes heart disease and increases the risk of stroke. Your blood pressure should be checked at least every 1 to 2 years. Ongoing high blood pressure should be treated with medicines if weight loss and exercise are not effective.  If you are 55 to 53 years old, ask your caregiver if you should take aspirin to prevent strokes.  Diabetes   screening involves taking a blood sample to check your fasting blood sugar level. This should be done once every 3 years, after age 45, if you are within normal weight and without risk factors for diabetes. Testing should be considered at a younger age or be carried out more frequently if you are overweight and have at least 1 risk factor for diabetes.  Breast cancer screening is essential preventive care for women. You should practice "breast  self-awareness." This means understanding the normal appearance and feel of your breasts and may include breast self-examination. Any changes detected, no matter how small, should be reported to a caregiver. Women in their 20s and 30s should have a clinical breast exam (CBE) by a caregiver as part of a regular health exam every 1 to 3 years. After age 40, women should have a CBE every year. Starting at age 40, women should consider having a mammography (breast X-ray test) every year. Women who have a family history of breast cancer should talk to their caregiver about genetic screening. Women at a high risk of breast cancer should talk to their caregivers about having magnetic resonance imaging (MRI) and a mammography every year.  The Pap test is a screening test for cervical cancer. A Pap test can show cell changes on the cervix that might become cervical cancer if left untreated. A Pap test is a procedure in which cells are obtained and examined from the lower end of the uterus (cervix).  Women should have a Pap test starting at age 21.  Between ages 21 and 29, Pap tests should be repeated every 2 years.  Beginning at age 30, you should have a Pap test every 3 years as long as the past 3 Pap tests have been normal.  Some women have medical problems that increase the chance of getting cervical cancer. Talk to your caregiver about these problems. It is especially important to talk to your caregiver if a new problem develops soon after your last Pap test. In these cases, your caregiver may recommend more frequent screening and Pap tests.  The above recommendations are the same for women who have or have not gotten the vaccine for human papillomavirus (HPV).  If you had a hysterectomy for a problem that was not cancer or a condition that could lead to cancer, then you no longer need Pap tests. Even if you no longer need a Pap test, a regular exam is a good idea to make sure no other problems are  starting.  If you are between ages 65 and 70, and you have had normal Pap tests going back 10 years, you no longer need Pap tests. Even if you no longer need a Pap test, a regular exam is a good idea to make sure no other problems are starting.  If you have had past treatment for cervical cancer or a condition that could lead to cancer, you need Pap tests and screening for cancer for at least 20 years after your treatment.  If Pap tests have been discontinued, risk factors (such as a new sexual partner) need to be reassessed to determine if screening should be resumed.  The HPV test is an additional test that may be used for cervical cancer screening. The HPV test looks for the virus that can cause the cell changes on the cervix. The cells collected during the Pap test can be tested for HPV. The HPV test could be used to screen women aged 30 years and older, and should   be used in women of any age who have unclear Pap test results. After the age of 30, women should have HPV testing at the same frequency as a Pap test.  Colorectal cancer can be detected and often prevented. Most routine colorectal cancer screening begins at the age of 50 and continues through age 75. However, your caregiver may recommend screening at an earlier age if you have risk factors for colon cancer. On a yearly basis, your caregiver may provide home test kits to check for hidden blood in the stool. Use of a small camera at the end of a tube, to directly examine the colon (sigmoidoscopy or colonoscopy), can detect the earliest forms of colorectal cancer. Talk to your caregiver about this at age 50, when routine screening begins. Direct examination of the colon should be repeated every 5 to 10 years through age 75, unless early forms of pre-cancerous polyps or small growths are found.  Hepatitis C blood testing is recommended for all people born from 1945 through 1965 and any individual with known risks for hepatitis C.  Practice  safe sex. Use condoms and avoid high-risk sexual practices to reduce the spread of sexually transmitted infections (STIs). STIs include gonorrhea, chlamydia, syphilis, trichomonas, herpes, HPV, and human immunodeficiency virus (HIV). Herpes, HIV, and HPV are viral illnesses that have no cure. They can result in disability, cancer, and death. Sexually active women aged 25 and younger should be checked for chlamydia. Older women with new or multiple partners should also be tested for chlamydia. Testing for other STIs is recommended if you are sexually active and at increased risk.  Osteoporosis is a disease in which the bones lose minerals and strength with aging. This can result in serious bone fractures. The risk of osteoporosis can be identified using a bone density scan. Women ages 65 and over and women at risk for fractures or osteoporosis should discuss screening with their caregivers. Ask your caregiver whether you should take a calcium supplement or vitamin D to reduce the rate of osteoporosis.  Menopause can be associated with physical symptoms and risks. Hormone replacement therapy is available to decrease symptoms and risks. You should talk to your caregiver about whether hormone replacement therapy is right for you.  Use sunscreen with sun protection factor (SPF) of 30 or more. Apply sunscreen liberally and repeatedly throughout the day. You should seek shade when your shadow is shorter than you. Protect yourself by wearing long sleeves, pants, a wide-brimmed hat, and sunglasses year round, whenever you are outdoors.  Once a month, do a whole body skin exam, using a mirror to look at the skin on your back. Notify your caregiver of new moles, moles that have irregular borders, moles that are larger than a pencil eraser, or moles that have changed in shape or color.  Stay current with required immunizations.  Influenza. You need a dose every fall (or winter). The composition of the flu vaccine  changes each year, so being vaccinated once is not enough.  Pneumococcal polysaccharide. You need 1 to 2 doses if you smoke cigarettes or if you have certain chronic medical conditions. You need 1 dose at age 65 (or older) if you have never been vaccinated.  Tetanus, diphtheria, pertussis (Tdap, Td). Get 1 dose of Tdap vaccine if you are younger than age 65, are over 65 and have contact with an infant, are a healthcare worker, are pregnant, or simply want to be protected from whooping cough. After that, you need a Td   booster dose every 10 years. Consult your caregiver if you have not had at least 3 tetanus and diphtheria-containing shots sometime in your life or have a deep or dirty wound.  HPV. You need this vaccine if you are a woman age 26 or younger. The vaccine is given in 3 doses over 6 months.  Measles, mumps, rubella (MMR). You need at least 1 dose of MMR if you were born in 1957 or later. You may also need a second dose.  Meningococcal. If you are age 19 to 21 and a first-year college student living in a residence hall, or have one of several medical conditions, you need to get vaccinated against meningococcal disease. You may also need additional booster doses.  Zoster (shingles). If you are age 60 or older, you should get this vaccine.  Varicella (chickenpox). If you have never had chickenpox or you were vaccinated but received only 1 dose, talk to your caregiver to find out if you need this vaccine.  Hepatitis A. You need this vaccine if you have a specific risk factor for hepatitis A virus infection or you simply wish to be protected from this disease. The vaccine is usually given as 2 doses, 6 to 18 months apart.  Hepatitis B. You need this vaccine if you have a specific risk factor for hepatitis B virus infection or you simply wish to be protected from this disease. The vaccine is given in 3 doses, usually over 6 months. Preventive Services / Frequency Ages 19 to 39  Blood  pressure check.** / Every 1 to 2 years.  Lipid and cholesterol check.** / Every 5 years beginning at age 20.  Clinical breast exam.** / Every 3 years for women in their 20s and 30s.  Pap test.** / Every 2 years from ages 21 through 29. Every 3 years starting at age 30 through age 65 or 70 with a history of 3 consecutive normal Pap tests.  HPV screening.** / Every 3 years from ages 30 through ages 65 to 70 with a history of 3 consecutive normal Pap tests.  Hepatitis C blood test.** / For any individual with known risks for hepatitis C.  Skin self-exam. / Monthly.  Influenza immunization.** / Every year.  Pneumococcal polysaccharide immunization.** / 1 to 2 doses if you smoke cigarettes or if you have certain chronic medical conditions.  Tetanus, diphtheria, pertussis (Tdap, Td) immunization. / A one-time dose of Tdap vaccine. After that, you need a Td booster dose every 10 years.  HPV immunization. / 3 doses over 6 months, if you are 26 and younger.  Measles, mumps, rubella (MMR) immunization. / You need at least 1 dose of MMR if you were born in 1957 or later. You may also need a second dose.  Meningococcal immunization. / 1 dose if you are age 19 to 21 and a first-year college student living in a residence hall, or have one of several medical conditions, you need to get vaccinated against meningococcal disease. You may also need additional booster doses.  Varicella immunization.** / Consult your caregiver.  Hepatitis A immunization.** / Consult your caregiver. 2 doses, 6 to 18 months apart.  Hepatitis B immunization.** / Consult your caregiver. 3 doses usually over 6 months. Ages 40 to 64  Blood pressure check.** / Every 1 to 2 years.  Lipid and cholesterol check.** / Every 5 years beginning at age 20.  Clinical breast exam.** / Every year after age 40.  Mammogram.** / Every year beginning at age 40   and continuing for as long as you are in good health. Consult with your  caregiver.  Pap test.** / Every 3 years starting at age 30 through age 65 or 70 with a history of 3 consecutive normal Pap tests.  HPV screening.** / Every 3 years from ages 30 through ages 65 to 70 with a history of 3 consecutive normal Pap tests.  Fecal occult blood test (FOBT) of stool. / Every year beginning at age 50 and continuing until age 75. You may not need to do this test if you get a colonoscopy every 10 years.  Flexible sigmoidoscopy or colonoscopy.** / Every 5 years for a flexible sigmoidoscopy or every 10 years for a colonoscopy beginning at age 50 and continuing until age 75.  Hepatitis C blood test.** / For all people born from 1945 through 1965 and any individual with known risks for hepatitis C.  Skin self-exam. / Monthly.  Influenza immunization.** / Every year.  Pneumococcal polysaccharide immunization.** / 1 to 2 doses if you smoke cigarettes or if you have certain chronic medical conditions.  Tetanus, diphtheria, pertussis (Tdap, Td) immunization.** / A one-time dose of Tdap vaccine. After that, you need a Td booster dose every 10 years.  Measles, mumps, rubella (MMR) immunization. / You need at least 1 dose of MMR if you were born in 1957 or later. You may also need a second dose.  Varicella immunization.** / Consult your caregiver.  Meningococcal immunization.** / Consult your caregiver.  Hepatitis A immunization.** / Consult your caregiver. 2 doses, 6 to 18 months apart.  Hepatitis B immunization.** / Consult your caregiver. 3 doses, usually over 6 months. Ages 65 and over  Blood pressure check.** / Every 1 to 2 years.  Lipid and cholesterol check.** / Every 5 years beginning at age 20.  Clinical breast exam.** / Every year after age 40.  Mammogram.** / Every year beginning at age 40 and continuing for as long as you are in good health. Consult with your caregiver.  Pap test.** / Every 3 years starting at age 30 through age 65 or 70 with a 3  consecutive normal Pap tests. Testing can be stopped between 65 and 70 with 3 consecutive normal Pap tests and no abnormal Pap or HPV tests in the past 10 years.  HPV screening.** / Every 3 years from ages 30 through ages 65 or 70 with a history of 3 consecutive normal Pap tests. Testing can be stopped between 65 and 70 with 3 consecutive normal Pap tests and no abnormal Pap or HPV tests in the past 10 years.  Fecal occult blood test (FOBT) of stool. / Every year beginning at age 50 and continuing until age 75. You may not need to do this test if you get a colonoscopy every 10 years.  Flexible sigmoidoscopy or colonoscopy.** / Every 5 years for a flexible sigmoidoscopy or every 10 years for a colonoscopy beginning at age 50 and continuing until age 75.  Hepatitis C blood test.** / For all people born from 1945 through 1965 and any individual with known risks for hepatitis C.  Osteoporosis screening.** / A one-time screening for women ages 65 and over and women at risk for fractures or osteoporosis.  Skin self-exam. / Monthly.  Influenza immunization.** / Every year.  Pneumococcal polysaccharide immunization.** / 1 dose at age 65 (or older) if you have never been vaccinated.  Tetanus, diphtheria, pertussis (Tdap, Td) immunization. / A one-time dose of Tdap vaccine if you are over   65 and have contact with an infant, are a healthcare worker, or simply want to be protected from whooping cough. After that, you need a Td booster dose every 10 years.  Varicella immunization.** / Consult your caregiver.  Meningococcal immunization.** / Consult your caregiver.  Hepatitis A immunization.** / Consult your caregiver. 2 doses, 6 to 18 months apart.  Hepatitis B immunization.** / Check with your caregiver. 3 doses, usually over 6 months. ** Family history and personal history of risk and conditions may change your caregiver's recommendations. Document Released: 03/06/2001 Document Revised: 04/02/2011  Document Reviewed: 06/05/2010 [employer] Patient Information 2013 [employer].  

## 2012-04-14 NOTE — Assessment & Plan Note (Signed)
con't with diet and exercise Pt doing great with weight loss

## 2012-04-14 NOTE — Progress Notes (Signed)
Subjective:     Kristin Hubbard is a 52 y.o. female and is here for a comprehensive physical exam. The patient reports no new problems.  History   Social History  . Marital Status: Single    Spouse Name: N/A    Number of Children: N/A  . Years of Education: N/A   Occupational History  . RECEPTionist    Social History Main Topics  . Smoking status: Current Every Day Smoker -- 1.00 packs/day for 17 years    Types: Cigarettes  . Smokeless tobacco: Never Used     Comment: she said when she loses 100 lbs she will quit smoking  . Alcohol Use: No  . Drug Use: No  . Sexually Active: Not Currently -- Female partner(s)   Other Topics Concern  . Not on file   Social History Narrative   Exercise--  Trainer and gym   Health Maintenance  Topic Date Due  . Mammogram  01/19/2010  . Influenza Vaccine  09/22/2012  . Pap Smear  04/15/2015  . Colonoscopy  04/15/2022  . Tetanus/tdap  04/15/2022    The following portions of the patient's history were reviewed and updated as appropriate:  She  has a past medical history of Hypertension; Thyroid disease; Migraine; and Anxiety. She  does not have any pertinent problems on file. She  has past surgical history that includes Cholecystectomy and Knee surgery (05/2007). Her family history includes Alcohol abuse in her father and mother and Diabetes in her father and unspecified family member. She  reports that she has been smoking Cigarettes.  She has a 17 pack-year smoking history. She has never used smokeless tobacco. She reports that she does not drink alcohol or use illicit drugs. She has a current medication list which includes the following prescription(s): carvedilol, fish oil-omega-3 fatty acids, levothyroxine, multivitamin, and simvastatin. Current Outpatient Prescriptions on File Prior to Visit  Medication Sig Dispense Refill  . carvedilol (COREG) 25 MG tablet Take 1 tablet (25 mg total) by mouth 2 (two) times daily with a meal.  180 tablet   3  . levothyroxine (SYNTHROID, LEVOTHROID) 150 MCG tablet Take 1 tablet (150 mcg total) by mouth daily.  90 tablet  1  . Multiple Vitamin (MULTIVITAMIN) tablet Take 1 tablet by mouth daily.      . simvastatin (ZOCOR) 40 MG tablet Take 1 tablet (40 mg total) by mouth at bedtime.  90 tablet  3   No current facility-administered medications on file prior to visit.   She is allergic to lisinopril..  Review of Systems Review of Systems  Constitutional: Negative for activity change, appetite change and fatigue.  HENT: Negative for hearing loss, congestion, tinnitus and ear discharge.  dentist q46m Eyes: Negative for visual disturbance (see optho q1y -- vision corrected to 20/20 with glasses).  Respiratory: Negative for cough, chest tightness and shortness of breath.   Cardiovascular: Negative for chest pain, palpitations and leg swelling.  Gastrointestinal: Negative for abdominal pain, diarrhea, constipation and abdominal distention.  Genitourinary: Negative for urgency, frequency, decreased urine volume and difficulty urinating.  Musculoskeletal: Negative for back pain, arthralgias and gait problem.  Skin: Negative for color change, pallor and rash.  Neurological: Negative for dizziness, light-headedness, numbness and headaches.  Hematological: Negative for adenopathy. Does not bruise/bleed easily.  Psychiatric/Behavioral: Negative for suicidal ideas, confusion, sleep disturbance, self-injury, dysphoric mood, decreased concentration and agitation.       Objective:    BP 126/72  Pulse 70  Temp(Src) 97.7 F (36.5 C) (  Oral)  Ht 5\' 7"  (1.702 m)  Wt 297 lb (134.718 kg)  BMI 46.51 kg/m2  SpO2 97%  LMP 03/24/2012 General appearance: alert, cooperative, appears stated age and no distress Head: Normocephalic, without obvious abnormality, atraumatic Eyes: conjunctivae/corneas clear. PERRL, EOM's intact. Fundi benign. Ears: normal TM's and external ear canals both ears Nose: Nares normal.  Septum midline. Mucosa normal. No drainage or sinus tenderness. Throat: lips, mucosa, and tongue normal; teeth and gums normal Neck: no adenopathy, no carotid bruit, no JVD, supple, symmetrical, trachea midline and thyroid not enlarged, symmetric, no tenderness/mass/nodules Back: symmetric, no curvature. ROM normal. No CVA tenderness. Lungs: clear to auscultation bilaterally Breasts: normal appearance, no masses or tenderness Heart: regular rate and rhythm, S1, S2 normal, no murmur, click, rub or gallop Abdomen: soft, non-tender; bowel sounds normal; no masses,  no organomegaly Pelvic: deferred Extremities: edema +1 nonpitting  Pulses: 2+ and symmetric Skin: Skin color, texture, turgor normal. No rashes or lesions Lymph nodes: Cervical, supraclavicular, and axillary nodes normal. Neurologic: Alert and oriented X 3, normal strength and tone. Normal symmetric reflexes. Normal coordination and gait Psych-- no depression, no anxiety      Assessment:    Healthy female exam.       Plan:   ghm ---pt refusing pap, colon and immunizations except PPD for foster care   check fasting labs See After Visit Summary for Counseling Recommendations

## 2012-04-15 LAB — POCT URINALYSIS DIPSTICK
Bilirubin, UA: NEGATIVE
Blood, UA: NEGATIVE
Glucose, UA: NEGATIVE
Nitrite, UA: NEGATIVE

## 2012-05-06 ENCOUNTER — Encounter: Payer: Self-pay | Admitting: Family Medicine

## 2012-05-07 NOTE — Telephone Encounter (Signed)
Ok to switch coreg to norvasc 5 mg  #30  1 po qd , 2 refills---ov 2 weeks lamasil 250 mg 1 po qd for 12 weeks total-----#30  2 refills---needs hep panal at start,  6 weeks and 12 weeks

## 2012-05-08 MED ORDER — AMLODIPINE BESYLATE 5 MG PO TABS: 5.0000 mg | ORAL_TABLET | Freq: Every day | ORAL | Status: DC

## 2012-05-08 MED ORDER — TERBINAFINE HCL 250 MG PO TABS: 250.0000 mg | ORAL_TABLET | Freq: Every day | ORAL | Status: DC

## 2012-05-22 ENCOUNTER — Encounter: Payer: BC Managed Care – PPO | Admitting: Family Medicine

## 2012-06-06 ENCOUNTER — Other Ambulatory Visit: Payer: Self-pay | Admitting: Family Medicine

## 2012-08-13 ENCOUNTER — Other Ambulatory Visit: Payer: Self-pay | Admitting: *Deleted

## 2012-08-13 MED ORDER — AMLODIPINE BESYLATE 5 MG PO TABS: 5.0000 mg | ORAL_TABLET | Freq: Every day | ORAL | Status: DC

## 2012-08-13 NOTE — Telephone Encounter (Signed)
Rx was filled Amlodipine 5 mg 08/13/12

## 2012-11-27 ENCOUNTER — Other Ambulatory Visit: Payer: Self-pay

## 2012-12-05 ENCOUNTER — Encounter: Payer: Self-pay | Admitting: Family Medicine

## 2012-12-05 ENCOUNTER — Ambulatory Visit (INDEPENDENT_AMBULATORY_CARE_PROVIDER_SITE_OTHER): Payer: BC Managed Care – PPO | Admitting: Family Medicine

## 2012-12-05 VITALS — BP 130/72 | HR 84 | Temp 98.5°F | Wt 263.0 lb

## 2012-12-05 DIAGNOSIS — I10 Essential (primary) hypertension: Secondary | ICD-10-CM

## 2012-12-05 DIAGNOSIS — R609 Edema, unspecified: Secondary | ICD-10-CM

## 2012-12-05 DIAGNOSIS — E785 Hyperlipidemia, unspecified: Secondary | ICD-10-CM

## 2012-12-05 MED ORDER — FUROSEMIDE 20 MG PO TABS: 20.0000 mg | ORAL_TABLET | Freq: Every day | ORAL | Status: DC

## 2012-12-05 MED ORDER — AMLODIPINE BESYLATE 5 MG PO TABS: 5.0000 mg | ORAL_TABLET | Freq: Every day | ORAL | Status: DC

## 2012-12-05 NOTE — Progress Notes (Signed)
  Subjective:    Kristin Hubbard is a 52 y.o. female who presents for evaluation of edema in both ankles and feet and both lower legs. The edema has been moderate. Onset of symptoms was several weeks ago, and patient reports symptoms have gradually worsened since that time. The edema is present in the evening. The patient states the problem is long-standing. The swelling has been aggravated by dependency of involved area and use of calcium channel blockers. The swelling has been relieved by elevation of involved area. Associated factors include: nothing. Cardiac risk factors: dyslipidemia, hypertension and obesity (BMI >= 30 kg/m2).  The following portions of the patient's history were reviewed and updated as appropriate: allergies, current medications, past family history, past medical history, past social history, past surgical history and problem list.  Review of Systems Pertinent items are noted in HPI.   Objective:    BP 130/72  Pulse 84  Temp(Src) 98.5 F (36.9 C) (Oral)  Wt 263 lb (119.296 kg)  SpO2 97% Lungs: clear to auscultation bilaterally Heart: S1, S2 normal Abdomen: soft, non-tender; bowel sounds normal; no masses,  no organomegaly Skin: Skin color, texture, turgor normal. No rashes or lesions  Ext-- + mostly nonpitting edema b/l low ext Cardiographics ECG: na  Imaging Chest x-ray: not indicated   Assessment:     Edema secondary to dependency vs Ca channel blocker.    Plan:    Recommendations: decrease sodium in the diet, elevate feet above the level of the heart whenever possible, increase physical activity, use of compression stockings, weight loss and she has almost lost 100 lbs. The patient was also instructed to call IMMEDIATELY (i.e., day or night) if any cardiopulmonary symptoms occur, especially chest pain, shortness of breath, dyspnea on exertion, paroxysmal nocturnal dyspnea, or orthopnea, and these were explained. Follow up in 3 weeks and as needed.

## 2012-12-05 NOTE — Progress Notes (Signed)
Pre visit review using our clinic review tool, if applicable. No additional management support is needed unless otherwise documented below in the visit note. 

## 2012-12-05 NOTE — Patient Instructions (Signed)
Edema Edema is an abnormal build-up of fluids in tissues. Because this is partly dependent on gravity (water flows to the lowest place), it is more common in the legs and thighs (lower extremities). It is also common in the looser tissues, like around the eyes. Painless swelling of the feet and ankles is common and increases as a person ages. It may affect both legs and may include the calves or even thighs. When squeezed, the fluid may move out of the affected area and may leave a dent for a few moments. CAUSES   Prolonged standing or sitting in one place for extended periods of time. Movement helps pump tissue fluid into the veins, and absence of movement prevents this, resulting in edema.  Varicose veins. The valves in the veins do not work as well as they should. This causes fluid to leak into the tissues.  Fluid and salt overload.  Injury, burn, or surgery to the leg, ankle, or foot, may damage veins and allow fluid to leak out.  Sunburn damages vessels. Leaky vessels allow fluid to go out into the sunburned tissues.  Allergies (from insect bites or stings, medications or chemicals) cause swelling by allowing vessels to become leaky.  Protein in the blood helps keep fluid in your vessels. Low protein, as in malnutrition, allows fluid to leak out.  Hormonal changes, including pregnancy and menstruation, cause fluid retention. This fluid may leak out of vessels and cause edema.  Medications that cause fluid retention. Examples are sex hormones, blood pressure medications, steroid treatment, or anti-depressants.  Some illnesses cause edema, especially heart failure, kidney disease, or liver disease.  Surgery that cuts veins or lymph nodes, such as surgery done for the heart or for breast cancer, may result in edema. DIAGNOSIS  Your caregiver is usually easily able to determine what is causing your swelling (edema) by simply asking what is wrong (getting a history) and examining you (doing  a physical). Sometimes x-rays, EKG (electrocardiogram or heart tracing), and blood work may be done to evaluate for underlying medical illness. TREATMENT  General treatment includes:  Leg elevation (or elevation of the affected body part).  Restriction of fluid intake.  Prevention of fluid overload.  Compression of the affected body part. Compression with elastic bandages or support stockings squeezes the tissues, preventing fluid from entering and forcing it back into the blood vessels.  Diuretics (also called water pills or fluid pills) pull fluid out of your body in the form of increased urination. These are effective in reducing the swelling, but can have side effects and must be used only under your caregiver's supervision. Diuretics are appropriate only for some types of edema. The specific treatment can be directed at any underlying causes discovered. Heart, liver, or kidney disease should be treated appropriately. HOME CARE INSTRUCTIONS   Elevate the legs (or affected body part) above the level of the heart, while lying down.  Avoid sitting or standing still for prolonged periods of time.  Avoid putting anything directly under the knees when lying down, and do not wear constricting clothing or garters on the upper legs.  Exercising the legs causes the fluid to work back into the veins and lymphatic channels. This may help the swelling go down.  The pressure applied by elastic bandages or support stockings can help reduce ankle swelling.  A low-salt diet may help reduce fluid retention and decrease the ankle swelling.  Take any medications exactly as prescribed. SEEK MEDICAL CARE IF:  Your edema is   not responding to recommended treatments. SEEK IMMEDIATE MEDICAL CARE IF:   You develop shortness of breath or chest pain.  You cannot breathe when you lay down; or if, while lying down, you have to get up and go to the window to get your breath.  You are having increasing  swelling without relief from treatment.  You develop a fever over 102 F (38.9 C).  You develop pain or redness in the areas that are swollen.  Tell your caregiver right away if you have gained 03 lb/1.4 kg in 1 day or 05 lb/2.3 kg in a week. MAKE SURE YOU:   Understand these instructions.  Will watch your condition.  Will get help right away if you are not doing well or get worse. Document Released: 01/08/2005 Document Revised: 07/10/2011 Document Reviewed: 08/27/2007 [employer] Patient Information 2014 [employer].  

## 2012-12-06 NOTE — Assessment & Plan Note (Signed)
Refill meds If edema does not improve consider changing bp med

## 2012-12-06 NOTE — Assessment & Plan Note (Signed)
Check labs Cont' meds 

## 2013-01-16 ENCOUNTER — Ambulatory Visit (INDEPENDENT_AMBULATORY_CARE_PROVIDER_SITE_OTHER): Payer: BC Managed Care – PPO | Admitting: Family Medicine

## 2013-01-16 ENCOUNTER — Encounter: Payer: Self-pay | Admitting: Family Medicine

## 2013-01-16 VITALS — BP 140/80 | HR 119 | Temp 99.9°F | Resp 16 | Wt 265.0 lb

## 2013-01-16 DIAGNOSIS — R509 Fever, unspecified: Secondary | ICD-10-CM

## 2013-01-16 LAB — POCT RAPID STREP A (OFFICE): Rapid Strep A Screen: NEGATIVE

## 2013-01-16 LAB — POCT INFLUENZA A/B: Influenza B, POC: NEGATIVE

## 2013-01-16 MED ORDER — AMOXICILLIN 875 MG PO TABS: 875.0000 mg | ORAL_TABLET | Freq: Two times a day (BID) | ORAL | Status: DC

## 2013-01-16 MED ORDER — ONDANSETRON 4 MG PO TBDP: 4.0000 mg | ORAL_TABLET | Freq: Three times a day (TID) | ORAL | Status: DC | PRN

## 2013-01-16 MED ORDER — FLUCONAZOLE 150 MG PO TABS: 150.0000 mg | ORAL_TABLET | Freq: Once | ORAL | Status: DC

## 2013-01-16 MED ORDER — HYDROXYZINE HCL 50 MG PO TABS: 50.0000 mg | ORAL_TABLET | Freq: Three times a day (TID) | ORAL | Status: DC | PRN

## 2013-01-16 NOTE — Patient Instructions (Signed)
Follow up as needed Start the Amox twice daily as directed Use the Zofran as needed for nausea Drink plenty of fluids Alternate tylenol and ibuprofen every 4 hrs for pain/fever Use the Hydroxyzine for the itching Call with any questions or concerns Hang in there!! Happy Early [place] Ouch!

## 2013-01-16 NOTE — Progress Notes (Signed)
   Subjective:    Patient ID: Kristin Hubbard, female    DOB: 1960/11/14, 52 y.o.   MRN: [id number]  HPI Fever- started Tuesday.  Rash appeared Wednesday, very itchy.  Started on trunk and spread outward.  No sore throat.  No cough.  No 'cold sxs'.  + anorexia.  No sinus pain/pressure.  + body aches.  No known sick contacts.  No abd pain, N/V/D.   Review of Systems For ROS see HPI     Objective:   Physical Exam  Vitals reviewed. Constitutional: She appears well-developed and well-nourished. She appears distressed (tearful and very upset throughout visit).  HENT:  Head: Normocephalic and atraumatic.  Nose: Nose normal.  TMs WNL bilaterally No TTP over sinuses Pharynx erythematous but no tonsillar enlargement or exudate  Eyes: Conjunctivae and EOM are normal. Pupils are equal, round, and reactive to light. Right eye exhibits no discharge. Left eye exhibits no discharge. No scleral icterus.  Neck: Normal range of motion. Neck supple.  Cardiovascular:  Tachy but regular S1/S2  Pulmonary/Chest: Effort normal and breath sounds normal. No respiratory distress. She has no wheezes. She has no rales.  Abdominal: Soft. Bowel sounds are normal. She exhibits no distension. There is no tenderness. There is no rebound.  Lymphadenopathy:    She has cervical adenopathy.  Skin: Skin is warm and dry. Rash (fine maculopapular rash diffusely on trunk and spreading to extremities) noted. There is erythema.          Assessment & Plan:

## 2013-01-16 NOTE — Progress Notes (Signed)
Pre visit review using our clinic review tool, if applicable. No additional management support is needed unless otherwise documented below in the visit note. 

## 2013-01-17 ENCOUNTER — Encounter (HOSPITAL_COMMUNITY): Payer: Self-pay | Admitting: Emergency Medicine

## 2013-01-17 ENCOUNTER — Emergency Department (INDEPENDENT_AMBULATORY_CARE_PROVIDER_SITE_OTHER)
Admission: EM | Admit: 2013-01-17 | Discharge: 2013-01-17 | Disposition: A | Discharge status: Nursing Home (Includes ICF) | Payer: BC Managed Care – PPO | Source: Home / Self Care | Attending: Emergency Medicine | Admitting: Emergency Medicine

## 2013-01-17 ENCOUNTER — Emergency Department (HOSPITAL_COMMUNITY)
Admission: EM | Admit: 2013-01-17 | Discharge: 2013-01-17 | Disposition: A | Discharge status: Home / Self Care | Payer: BC Managed Care – PPO | Attending: Emergency Medicine | Admitting: Emergency Medicine

## 2013-01-17 DIAGNOSIS — K759 Inflammatory liver disease, unspecified: Secondary | ICD-10-CM

## 2013-01-17 DIAGNOSIS — R509 Fever, unspecified: Secondary | ICD-10-CM | POA: Insufficient documentation

## 2013-01-17 DIAGNOSIS — B9789 Other viral agents as the cause of diseases classified elsewhere: Secondary | ICD-10-CM | POA: Insufficient documentation

## 2013-01-17 DIAGNOSIS — F17200 Nicotine dependence, unspecified, uncomplicated: Secondary | ICD-10-CM | POA: Insufficient documentation

## 2013-01-17 DIAGNOSIS — Z8679 Personal history of other diseases of the circulatory system: Secondary | ICD-10-CM | POA: Insufficient documentation

## 2013-01-17 DIAGNOSIS — IMO0001 Reserved for inherently not codable concepts without codable children: Secondary | ICD-10-CM | POA: Insufficient documentation

## 2013-01-17 DIAGNOSIS — B349 Viral infection, unspecified: Secondary | ICD-10-CM

## 2013-01-17 DIAGNOSIS — M2550 Pain in unspecified joint: Secondary | ICD-10-CM | POA: Insufficient documentation

## 2013-01-17 DIAGNOSIS — Z792 Long term (current) use of antibiotics: Secondary | ICD-10-CM | POA: Insufficient documentation

## 2013-01-17 DIAGNOSIS — I10 Essential (primary) hypertension: Secondary | ICD-10-CM | POA: Insufficient documentation

## 2013-01-17 DIAGNOSIS — F411 Generalized anxiety disorder: Secondary | ICD-10-CM | POA: Insufficient documentation

## 2013-01-17 DIAGNOSIS — E039 Hypothyroidism, unspecified: Secondary | ICD-10-CM | POA: Insufficient documentation

## 2013-01-17 DIAGNOSIS — R17 Unspecified jaundice: Secondary | ICD-10-CM | POA: Insufficient documentation

## 2013-01-17 DIAGNOSIS — Z79899 Other long term (current) drug therapy: Secondary | ICD-10-CM | POA: Insufficient documentation

## 2013-01-17 DIAGNOSIS — R21 Rash and other nonspecific skin eruption: Secondary | ICD-10-CM

## 2013-01-17 LAB — HEPATIC FUNCTION PANEL
Albumin: 2.8 g/dL — ABNORMAL LOW (ref 3.5–5.2)
Alkaline Phosphatase: 216 U/L — ABNORMAL HIGH (ref 39–117)
Total Bilirubin: 4 mg/dL — ABNORMAL HIGH (ref 0.3–1.2)
Total Protein: 6.8 g/dL (ref 6.0–8.3)

## 2013-01-17 LAB — POCT I-STAT, CHEM 8
BUN: 10 mg/dL (ref 6–23)
Calcium, Ion: 1.24 mmol/L — ABNORMAL HIGH (ref 1.12–1.23)
Glucose, Bld: 125 mg/dL — ABNORMAL HIGH (ref 70–99)
HCT: 46 % (ref 36.0–46.0)
Hemoglobin: 15.6 g/dL — ABNORMAL HIGH (ref 12.0–15.0)
Potassium: 3.1 mEq/L — ABNORMAL LOW (ref 3.5–5.1)
TCO2: 23 mmol/L (ref 0–100)

## 2013-01-17 LAB — CBC
HCT: 41.5 % (ref 36.0–46.0)
Hemoglobin: 14.5 g/dL (ref 12.0–15.0)
MCH: 32.8 pg (ref 26.0–34.0)
MCHC: 34.9 g/dL (ref 30.0–36.0)

## 2013-01-17 LAB — POCT URINALYSIS DIP (DEVICE)
Glucose, UA: 100 mg/dL — AB
Nitrite: NEGATIVE
Protein, ur: 100 mg/dL — AB
Specific Gravity, Urine: 1.025 (ref 1.005–1.030)
Urobilinogen, UA: 1 mg/dL (ref 0.0–1.0)

## 2013-01-17 MED ORDER — ONDANSETRON HCL 4 MG/2ML IJ SOLN
4.0000 mg | Freq: Once | INTRAMUSCULAR | Status: AC
  Administered 2013-01-17: 4 mg via INTRAVENOUS
  Filled 2013-01-17: qty 2

## 2013-01-17 MED ORDER — DIPHENHYDRAMINE HCL 50 MG/ML IJ SOLN
25.0000 mg | Freq: Once | INTRAMUSCULAR | Status: AC
  Administered 2013-01-17: 25 mg via INTRAVENOUS
  Filled 2013-01-17: qty 1

## 2013-01-17 MED ORDER — SODIUM CHLORIDE 0.9 % IV BOLUS (SEPSIS)
1000.0000 mL | Freq: Once | INTRAVENOUS | Status: AC
  Administered 2013-01-17: 1000 mL via INTRAVENOUS

## 2013-01-17 NOTE — ED Notes (Signed)
Pt states itching reduced after bendadryl.  Dr Gordy Levan at bedside.

## 2013-01-17 NOTE — ED Provider Notes (Signed)
CSN: [id number]     Arrival date & time 01/17/13  1411 History   First MD Initiated Contact with Patient 01/17/13 1531     Chief Complaint  Patient presents with  . Rash   (Consider location/radiation/quality/duration/timing/severity/associated sxs/prior Treatment) Patient is a 52 y.o. female presenting with rash. The history is provided by the patient.  Rash Location:  Full body Quality: itchiness and redness   Quality: not bruising, not draining, not painful, not peeling, not scaling, not swelling and not weeping   Severity:  Moderate Onset quality:  Gradual Duration:  5 days Timing:  Constant Progression:  Worsening Chronicity:  New Relieved by:  Nothing Associated symptoms: fever, joint pain and myalgias   Associated symptoms: no abdominal pain, no diarrhea, no headaches, no nausea, no shortness of breath, no sore throat and not vomiting     Tuesday: chills, fever at night Wednesday: 430 am, fever broke, went to work, sent home again at 1130 (chills again). No temp. Temp at home spiked, 102F, intermittent, improved with ibuprofen. Rash first noticed Wednesday evening on trunk/abdomen. Urine very dark on Wednesday, pt thinks 2/2 dehydration. Fine rash, slightly raised, pruritic.  Thursday: Rash spread throughout body.  Friday:  Saturday (today): rash worsening, joints are swollen.    Past Medical History  Diagnosis Date  . Hypertension   . Thyroid disease     Hypothyroidism  . Migraine   . Anxiety    Past Surgical History  Procedure Laterality Date  . Cholecystectomy    . Knee surgery  05/2007    left   Family History  Problem Relation Age of Onset  . Diabetes    . Alcohol abuse Father   . Diabetes Father   . Alcohol abuse Mother    History  Substance Use Topics  . Smoking status: Current Every Day Smoker -- 1.00 packs/day for 17 years    Types: Cigarettes  . Smokeless tobacco: Never Used     Comment: she said when she loses 100 lbs she will quit smoking   . Alcohol Use: No   OB History   Grav Para Term Preterm Abortions TAB SAB Ect Mult Living                 Review of Systems  Constitutional: Positive for fever. Negative for chills.  HENT: Negative for sore throat.   Eyes: Negative for pain.  Respiratory: Negative for cough and shortness of breath.   Cardiovascular: Negative for chest pain.  Gastrointestinal: Negative for nausea, vomiting, abdominal pain and diarrhea.  Genitourinary: Negative for dysuria.  Musculoskeletal: Positive for arthralgias and myalgias. Negative for back pain.  Skin: Positive for rash.  Neurological: Negative for numbness and headaches.    Allergies  Lisinopril  Home Medications   Current Outpatient Rx  Name  Route  Sig  Dispense  Refill  . amLODipine (NORVASC) 5 MG tablet   Oral   Take 1 tablet (5 mg total) by mouth daily.   30 tablet   2   . amoxicillin (AMOXIL) 875 MG tablet   Oral   Take 1 tablet (875 mg total) by mouth 2 (two) times daily.   20 tablet   0   . fish oil-omega-3 fatty acids 1000 MG capsule   Oral   Take 2 g by mouth daily.         . fluconazole (DIFLUCAN) 150 MG tablet   Oral   Take 1 tablet (150 mg total) by mouth once.   1  tablet   0   . furosemide (LASIX) 20 MG tablet   Oral   Take 1 tablet (20 mg total) by mouth daily.   30 tablet   3   . hydrOXYzine (ATARAX/VISTARIL) 50 MG tablet   Oral   Take 1 tablet (50 mg total) by mouth 3 (three) times daily as needed.   30 tablet   0   . levothyroxine (SYNTHROID, LEVOTHROID) 150 MCG tablet      TAKE ONE TABLET BY MOUTH EVERY DAY   90 tablet   1   . Multiple Vitamin (MULTIVITAMIN) tablet   Oral   Take 1 tablet by mouth daily.         . ondansetron (ZOFRAN ODT) 4 MG disintegrating tablet   Oral   Take 1 tablet (4 mg total) by mouth every 8 (eight) hours as needed for nausea or vomiting.   20 tablet   0   . simvastatin (ZOCOR) 40 MG tablet   Oral   Take 1 tablet (40 mg total) by mouth at bedtime.    90 tablet   3     Repeat labs are due now    BP 125/47  Pulse 99  Temp(Src) 100.1 F (37.8 C) (Oral)  Resp 20  Wt 264 lb 8 oz (119.976 kg)  SpO2 97% Physical Exam  Constitutional: She is oriented to person, place, and time. She appears well-developed and well-nourished. She appears ill. No distress.  HENT:  Head: Normocephalic and atraumatic.  Eyes: Pupils are equal, round, and reactive to light. Right eye exhibits no discharge. Left eye exhibits no discharge.  Neck: Normal range of motion.  Cardiovascular: Normal rate, regular rhythm and normal heart sounds.   Pulmonary/Chest: Effort normal and breath sounds normal.  Abdominal: Soft. She exhibits no distension. There is no tenderness.  Musculoskeletal: Normal range of motion.  Neurological: She is alert and oriented to person, place, and time.  Skin: Skin is warm. Rash noted. Rash is maculopapular (Red, raised lesions, blanching, coalescing. no sloughing with traction. No blistering. No vesicles.). She is not diaphoretic.    ED Course  Procedures (including critical care time) Labs Review Labs Reviewed  HEPATITIS PANEL, ACUTE   Imaging Review No results found.  EKG Interpretation   None       MDM   1. Viral syndrome   2. Hyperbilirubinemia   3. Rash    52 yo F sent from urgent care for fever and rash.   Patient evaluated by myself and the attending. Reviewed labs from OSH. Patient still apparently uncomfortable. Will IV hydrate, sent hepatitis panel. Patient able to tolerate PO fluids and solids. Given the patients symptoms, this is concerning for a viral syndrome, possibly hepatitis, though her ALT elevations are minimal. Patient with direct hyperbilirubinemia with accompanying pale stools.   Recommend continued symptomatic treatment with close follow-up. Patient has close follow-up planned with PCP on Monday (2 days from now). Recommended continued fluid intake for oral rehydration. Patient in agreement with  plan. Strict return precautions given. Patient discharged to home in stable condition. Patient seen and evaluated by myself and my attending, Dr. Bebe Shaggy.      Imagene Sheller, MD 01/18/13 [phone]

## 2013-01-17 NOTE — ED Provider Notes (Signed)
Patient seen/examined in the Emergency Department in conjunction with Resident Physician Provider Gordy Levan Patient reports rash and bodyaches, denies HA.  Denies abd pain Exam : awake/alert, rash noted but it blanches.  No oral lesions, she is nontoxic in appearance Plan: will rehydrate and reassess.  Pt well appearing, still suspect viral process and can continue workup as outpatient   Kristin Gaskins, MD 01/17/13 1650

## 2013-01-17 NOTE — ED Provider Notes (Signed)
Medical screening examination/treatment/procedure(s) were performed by non-physician practitioner and as supervising physician I was immediately available for consultation/collaboration.  Lailynn Southgate, M.D.  Yuriel Lopezmartinez C Ayesha Markwell, MD 01/17/13 2306 

## 2013-01-17 NOTE — ED Notes (Signed)
Pt reports she was dx w/Scarlet fever yest by PCP... Given Amox, Hydroxyzine, and Zofran w/little relief Reports rash is getting worse all over her body w/increased joint pain She is taking meds and tolerating well Alert w/no signs of acute distress.

## 2013-01-17 NOTE — ED Notes (Addendum)
Pt sent here from [facility] with rash all over body. Was treated at her doctor yesterday for possible scarlet fever. She was sent here for unspecified hepatitis  And has scleral icgterus with fever and malaise. Pt has LE swelling and liver function elevated.

## 2013-01-17 NOTE — ED Provider Notes (Signed)
CSN: [id number]     Arrival date & time 01/17/13  4098 History   First MD Initiated Contact with Patient 01/17/13 1139     Chief Complaint  Patient presents with  . Rash   (Consider location/radiation/quality/duration/timing/severity/associated sxs/prior Treatment) HPI Comments: Patient reports fever up to 102.0 on daily basis since 01/13/2013 and diffuse pruritic rash that began 01/15/2013. Was seen by her PCP yesterday (Dr. Beverely Low) and diagnosed with scarlet fever and placed on hydroxyzine, zofran and amoxicillin. Records reviewed from visit and influenza swab was negative and rapid strep screen was negative. Patient presents today to UC because rash has become more widespread, very pruritic and over past 24 hours she has developed uncomfortable moderate swelling of her hands, feet and ankles. Continues to feel very fatigued with generalized malaise. Reports urine has become dark in color and she also has nausea. No vomiting or diarrhea. Denies sore throat, cough, rhinorrhea, oral lesions, chest pain, abdominal pain or dyspnea. No dysuria, hematuria or melena.   The history is provided by the patient.    Past Medical History  Diagnosis Date  . Hypertension   . Thyroid disease     Hypothyroidism  . Migraine   . Anxiety    Past Surgical History  Procedure Laterality Date  . Cholecystectomy    . Knee surgery  05/2007    left   Family History  Problem Relation Age of Onset  . Diabetes    . Alcohol abuse Father   . Diabetes Father   . Alcohol abuse Mother    History  Substance Use Topics  . Smoking status: Current Every Day Smoker -- 1.00 packs/day for 17 years    Types: Cigarettes  . Smokeless tobacco: Never Used     Comment: she said when she loses 100 lbs she will quit smoking  . Alcohol Use: No   OB History   Grav Para Term Preterm Abortions TAB SAB Ect Mult Living                 Review of Systems  Constitutional: Positive for fever, chills, appetite change and  fatigue.  HENT: Negative.   Eyes: Negative.   Respiratory: Negative.   Cardiovascular: Positive for leg swelling. Negative for chest pain and palpitations.  Gastrointestinal: Positive for nausea. Negative for vomiting, abdominal pain, diarrhea, constipation, blood in stool and abdominal distention.  Endocrine: Negative for polydipsia, polyphagia and polyuria.  Genitourinary: Positive for decreased urine volume. Negative for dysuria, frequency, hematuria, flank pain, vaginal bleeding, vaginal discharge, difficulty urinating and genital sores.  Musculoskeletal: Positive for arthralgias, joint swelling and myalgias. Negative for back pain, gait problem, neck pain and neck stiffness.  Skin: Positive for rash. Negative for color change, pallor and wound.  Allergic/Immunologic: Negative for immunocompromised state.  Neurological: Negative.   Hematological: Negative for adenopathy. Does not bruise/bleed easily.  Psychiatric/Behavioral: Negative.     Allergies  Lisinopril  Home Medications   Current Outpatient Rx  Name  Route  Sig  Dispense  Refill  . amLODipine (NORVASC) 5 MG tablet   Oral   Take 1 tablet (5 mg total) by mouth daily.   30 tablet   2   . amoxicillin (AMOXIL) 875 MG tablet   Oral   Take 1 tablet (875 mg total) by mouth 2 (two) times daily.   20 tablet   0   . fish oil-omega-3 fatty acids 1000 MG capsule   Oral   Take 2 g by mouth daily.         Marland Kitchen  fluconazole (DIFLUCAN) 150 MG tablet   Oral   Take 1 tablet (150 mg total) by mouth once.   1 tablet   0   . furosemide (LASIX) 20 MG tablet   Oral   Take 1 tablet (20 mg total) by mouth daily.   30 tablet   3   . hydrOXYzine (ATARAX/VISTARIL) 50 MG tablet   Oral   Take 1 tablet (50 mg total) by mouth 3 (three) times daily as needed.   30 tablet   0   . levothyroxine (SYNTHROID, LEVOTHROID) 150 MCG tablet      TAKE ONE TABLET BY MOUTH EVERY DAY   90 tablet   1   . Multiple Vitamin (MULTIVITAMIN)  tablet   Oral   Take 1 tablet by mouth daily.         . ondansetron (ZOFRAN ODT) 4 MG disintegrating tablet   Oral   Take 1 tablet (4 mg total) by mouth every 8 (eight) hours as needed for nausea or vomiting.   20 tablet   0   . simvastatin (ZOCOR) 40 MG tablet   Oral   Take 1 tablet (40 mg total) by mouth at bedtime.   90 tablet   3     Repeat labs are due now    BP 155/81  Pulse 98  Temp(Src) 97.9 F (36.6 C) (Oral)  Resp 18  SpO2 99% Physical Exam  Constitutional: She is oriented to person, place, and time. She appears well-developed and well-nourished.  +obese  HENT:  Head: Normocephalic and atraumatic.  Right Ear: Hearing, tympanic membrane, external ear and ear canal normal.  Left Ear: Hearing, tympanic membrane, external ear and ear canal normal.  Nose: Nose normal.  Mouth/Throat: Uvula is midline, oropharynx is clear and moist and mucous membranes are normal. No oral lesions.  Eyes: Conjunctivae are normal. Right eye exhibits no discharge. Left eye exhibits no discharge. Scleral icterus is present.  Neck: Normal range of motion. Neck supple. No thyromegaly present.  Cardiovascular: Normal rate, regular rhythm and normal heart sounds.   Pulmonary/Chest: Effort normal and breath sounds normal.  Abdominal: Soft. Bowel sounds are normal. She exhibits no distension. There is no tenderness.  Musculoskeletal: Normal range of motion. She exhibits edema. She exhibits no tenderness.  +moderate swelling of hands, feet and ankles  Lymphadenopathy:    She has no cervical adenopathy.  Neurological: She is alert and oriented to person, place, and time.  Skin: Skin is warm and dry. Rash noted. There is erythema. No pallor.  Diffuse erythematous, confluent, macular, blanching, non-tender rash.   Psychiatric: She has a normal mood and affect. Her behavior is normal.    ED Course  Procedures (including critical care time) Labs Review Labs Reviewed  POCT URINALYSIS DIP  (DEVICE) - Abnormal; Notable for the following:    Glucose, UA 100 (*)    Bilirubin Urine LARGE (*)    Ketones, ur TRACE (*)    Hgb urine dipstick MODERATE (*)    Protein, ur 100 (*)    All other components within normal limits  POCT I-STAT, CHEM 8 - Abnormal; Notable for the following:    Potassium 3.1 (*)    Glucose, Bld 125 (*)    Calcium, Ion 1.24 (*)    Hemoglobin 15.6 (*)    All other components within normal limits  CBC  HEPATIC FUNCTION PANEL  POCT INFECTIOUS MONO SCREEN   Imaging Review No results found.  EKG Interpretation    Date/Time:  Ventricular Rate:    PR Interval:    QRS Duration:   QT Interval:    QTC Calculation:   R Axis:     Text Interpretation:              MDM   Case discussed with and patient examined by Dr. Lorenz Coaster. Presentation is worrisome for unspecified hepatitis as patient appears unwell, has scleral icterus with fever and malaise. Rash, although widespread, is non-specific. UA with elevated bilirubin and hematuria. I-stat with hypokalemia, but normal renal function. CBC only notable for mild leukocytosis, but with normal H/H and platelet count. Monospot negative, however, may be to early in illness for EBV point of care testing to provide accurate result. Will transfer patient to [facility] ER as it would be of benefit to patient to determine origin of acute liver dysfunction and provide necessary treatment along with supportive care. She will likely need to be admitted to hospital to complete evaluation.    Jess Barters [place], [place] 01/17/13 1339

## 2013-01-18 LAB — HEPATITIS PANEL, ACUTE: Hepatitis B Surface Ag: NEGATIVE

## 2013-01-18 NOTE — ED Provider Notes (Signed)
I have personally seen and examined the patient.  I have discussed the plan of care with the resident.  I have reviewed the documentation on PMH/FH/Soc. History.  I have reviewed the documentation of the resident and agree.   Joya Gaskins, MD 01/18/13 [phone]

## 2013-01-19 ENCOUNTER — Telehealth: Payer: Self-pay | Admitting: *Deleted

## 2013-01-19 DIAGNOSIS — R509 Fever, unspecified: Secondary | ICD-10-CM | POA: Insufficient documentation

## 2013-01-19 NOTE — Assessment & Plan Note (Addendum)
New.  No obvious cause- flu and rapid strep negative despite almost textbook appearing scarlet fever rash.  Due to rash, will start Amox despite negative rapid strep.  Encouraged pt to drink plenty of fluids, at which point she states that 'i can't'.  Could not give reason why she was unable to drink- again denied nausea.  Will give zofran prn and again stressed the importance of hydration.  Hydroxyzine prn for itching due to rash.  Reviewed supportive care and red flags that should prompt return.  Pt expressed understanding and is in agreement w/ plan.

## 2013-01-19 NOTE — Telephone Encounter (Signed)
Dr.Lowne does not have any appointments today, the patient will need to be seen in the next available appointment slot.     KP

## 2013-01-19 NOTE — Telephone Encounter (Signed)
Patient called and wanted to make a hospital follow up with dr Laury Axon. I informed patient that dr Laury Axon did not have an appointment available. Patient then demanded that she really needed to see dr Laury Axon today and no one else. Please advise. Patient was seen at the hospital on 01/17/2013 for rash all over her body.

## 2013-01-20 ENCOUNTER — Encounter: Payer: Self-pay | Admitting: Family Medicine

## 2013-01-20 ENCOUNTER — Ambulatory Visit (INDEPENDENT_AMBULATORY_CARE_PROVIDER_SITE_OTHER): Payer: BC Managed Care – PPO | Admitting: Family Medicine

## 2013-01-20 ENCOUNTER — Other Ambulatory Visit: Payer: Self-pay | Admitting: Family Medicine

## 2013-01-20 VITALS — BP 120/64 | HR 100 | Temp 99.4°F | Wt 265.0 lb

## 2013-01-20 DIAGNOSIS — R748 Abnormal levels of other serum enzymes: Secondary | ICD-10-CM

## 2013-01-20 DIAGNOSIS — R509 Fever, unspecified: Secondary | ICD-10-CM

## 2013-01-20 DIAGNOSIS — R81 Glycosuria: Secondary | ICD-10-CM

## 2013-01-20 LAB — POCT URINALYSIS DIPSTICK
Blood, UA: NEGATIVE
Glucose, UA: NEGATIVE
Nitrite, UA: NEGATIVE
Protein, UA: NEGATIVE
Spec Grav, UA: 1.01
Urobilinogen, UA: 0.2

## 2013-01-20 NOTE — Progress Notes (Signed)
Pre visit review using our clinic review tool, if applicable. No additional management support is needed unless otherwise documented below in the visit note. 

## 2013-01-20 NOTE — Progress Notes (Signed)
  Subjective:     Kristin Hubbard is a 52 y.o. female who presents for evaluation of fever. She has had the fever for a few weeks. Symptoms have been unchanged. Symptoms are described as chills, hot and cold spells and sweatsl. . Associated symptoms are body aches, chills and fatigue. Patient denies abdominal pain, diarrhea, nausea, otitis symptoms, poor appetite, URI symptoms, urinary tract symptoms and vomiting.  She has tried to alleviate the symptoms with acetaminophen, ibuprofen, rest and abx with no relief. The patient has no known comorbidities (structural heart/valvular disease, prosthetic joints, immunocompromised state, recent dental work, known abscesses). See ER visits and last ov.     The following portions of the patient's history were reviewed and updated as appropriate: allergies, current medications, past family history, past medical history, past social history, past surgical history and problem list.  Review of Systems Pertinent items are noted in HPI.   Objective:    BP 120/64  Pulse 100  Temp(Src) 99.4 F (37.4 C) (Oral)  Wt 265 lb (120.203 kg)  SpO2 96% General appearance: alert, cooperative, appears stated age and no distress Ears: normal TM's and external ear canals both ears Nose: Nares normal. Septum midline. Mucosa normal. No drainage or sinus tenderness. Throat: lips, mucosa, and tongue normal; teeth and gums normal Neck: no adenopathy, no carotid bruit, no JVD, supple, symmetrical, trachea midline and thyroid not enlarged, symmetric, no tenderness/mass/nodules Lungs: clear to auscultation bilaterally Heart: S1, S2 normal Extremities: extremities normal, atraumatic, no cyanosis or edema  Skin-- no rash--- resolved Assessment:    Fever is likely secondary to viral etiology.  ---- ? mono   Plan:    Supportive care with appropriate antipyretics and fluids. Obtain labs per orders. Follow up in a few days or as needed.

## 2013-01-20 NOTE — Patient Instructions (Signed)
Infectious Mononucleosis  Infectious mononucleosis (mono) is a common germ (viral) infection in children, teenagers, and young adults.   CAUSES   Mono is an infection caused by the Epstein Barr virus. The virus is spread by close personal contact with someone who has the infection. It can be passed by contact with your saliva through things such as kissing or sharing drinking glasses. Sometimes, the infection can be spread from someone who does not appear sick but still spreads the virus (asymptomatic carrier state).   SYMPTOMS   The most common symptoms of Mono are:   Sore throat.   Headache.   Fatigue.   Muscle aches.   Swollen glands.   Fever.   Poor appetite.   Enlarged liver or spleen.  The less common symptoms can include:   Rash.   Feeling sick to your stomach (nauseous).   Abdominal pain.  DIAGNOSIS   Mono is diagnosed by a blood test.   TREATMENT   Treatment of mono is usually at home. There is no medicine that cures this virus. Sometimes hospital treatment is needed in severe cases. Steroid medicine sometimes is needed if the swelling in the throat causes breathing or swallowing problems.   HOME CARE INSTRUCTIONS    Drink enough fluids to keep your urine clear or pale yellow.   Eat soft foods. Cool foods like popsicles or ice cream can soothe a sore throat.   Only take over-the-counter or prescription medicines for pain, discomfort, or fever as directed by your caregiver. Children under 18 years of age should not take aspirin.   Gargle salt water. This may help relieve your sore throat. Put 1 teaspoon (tsp) of salt in 1 cup of warm water. Sucking on hard candy may also help.   Rest as needed.   Start regular activities gradually after the fever is gone. Be sure to rest when tired.   Avoid strenuous exercise or contact sports until your caregiver says it is okay. The liver and spleen could be seriously injured.   Avoid sharing drinking glasses or kissing until your caregiver tells you  that you are no longer contagious.  SEEK MEDICAL CARE IF:    Your fever is not gone after 7 days.   Your activity level is not back to normal after 2 weeks.   You have yellow coloring to eyes and skin (jaundice).  SEEK IMMEDIATE MEDICAL CARE IF:    You have severe pain in the abdomen or shoulder.   You have trouble swallowing or drooling.   You have trouble breathing.   You develop a stiff neck.   You develop a severe headache.   You cannot stop throwing up (vomiting).   You have convulsions.   You are confused.   You have trouble with balance.   You develop signs of body fluid loss (dehydration):   Weakness.   Sunken eyes.   Pale skin.   Dry mouth.   Rapid breathing or pulse.  MAKE SURE YOU:    Understand these instructions.   Will watch your condition.   Will get help right away if you are not doing well or get worse.  Document Released: 01/06/2000 Document Revised: 04/02/2011 Document Reviewed: 11/04/2007  ExitCare Patient Information 2014 [employer].

## 2013-01-21 LAB — EPSTEIN-BARR VIRUS VCA ANTIBODY PANEL
EBV EA IgG: 7.4 U/mL (ref <9.0)
EBV NA IgG: >600 U/mL — ABNORMAL HIGH (ref <18.0)
EBV VCA IgM: <10 U/mL (ref <36.0)

## 2013-01-21 LAB — CBC WITH DIFFERENTIAL/PLATELET
Basophils Absolute: 0 10*3/uL (ref 0.0–0.1)
Basophils Relative: 0.1 % (ref 0.0–3.0)
Eosinophils Absolute: 0.6 10*3/uL (ref 0.0–0.7)
HCT: 36.7 % (ref 36.0–46.0)
Hemoglobin: 12.5 g/dL (ref 12.0–15.0)
Lymphs Abs: 2.4 10*3/uL (ref 0.7–4.0)
MCHC: 34 g/dL (ref 30.0–36.0)
MCV: 93.6 fl (ref 78.0–100.0)
Monocytes Absolute: 1.9 10*3/uL — ABNORMAL HIGH (ref 0.1–1.0)
Neutro Abs: 5.9 10*3/uL (ref 1.4–7.7)
RBC: 3.92 Mil/uL (ref 3.87–5.11)
RDW: 14 % (ref 11.5–14.6)
WBC: 10.9 10*3/uL — ABNORMAL HIGH (ref 4.5–10.5)

## 2013-01-21 LAB — BASIC METABOLIC PANEL
BUN: 10 mg/dL (ref 6–23)
CO2: 28 mEq/L (ref 19–32)
Calcium: 8.6 mg/dL (ref 8.4–10.5)
Chloride: 103 mEq/L (ref 96–112)
Creatinine, Ser: 0.6 mg/dL (ref 0.4–1.2)
GFR: 111.15 mL/min (ref >60.00)
Glucose, Bld: 111 mg/dL — ABNORMAL HIGH (ref 70–99)
Potassium: 3.1 mEq/L — ABNORMAL LOW (ref 3.5–5.1)
Sodium: 138 mEq/L (ref 135–145)

## 2013-01-21 LAB — MONONUCLEOSIS SCREEN: Mono Screen: NEGATIVE

## 2013-01-21 LAB — GAMMA GT: GGT: 119 U/L — ABNORMAL HIGH (ref 7–51)

## 2013-01-21 LAB — HEPATITIS PANEL, ACUTE
Hep A IgM: NONREACTIVE
Hep B C IgM: NONREACTIVE
Hepatitis B Surface Ag: NEGATIVE

## 2013-01-21 LAB — B. BURGDORFI ANTIBODIES: B burgdorferi Ab IgG+IgM: 0.19 {ISR}

## 2013-01-21 LAB — HEMOGLOBIN A1C: Hgb A1c MFr Bld: 5.6 % (ref 4.6–6.5)

## 2013-01-21 LAB — HEPATIC FUNCTION PANEL
AST: 37 U/L (ref 0–37)
Albumin: 2.6 g/dL — ABNORMAL LOW (ref 3.5–5.2)
Total Protein: 6.1 g/dL (ref 6.0–8.3)

## 2013-01-23 ENCOUNTER — Ambulatory Visit (INDEPENDENT_AMBULATORY_CARE_PROVIDER_SITE_OTHER): Payer: BC Managed Care – PPO | Admitting: Family Medicine

## 2013-01-23 ENCOUNTER — Other Ambulatory Visit: Payer: Self-pay | Admitting: Family Medicine

## 2013-01-23 ENCOUNTER — Ambulatory Visit: Payer: BC Managed Care – PPO | Admitting: Family Medicine

## 2013-01-23 ENCOUNTER — Encounter: Payer: Self-pay | Admitting: Family Medicine

## 2013-01-23 VITALS — BP 122/68 | HR 98 | Temp 99.0°F | Wt 265.0 lb

## 2013-01-23 DIAGNOSIS — R509 Fever, unspecified: Secondary | ICD-10-CM

## 2013-01-23 DIAGNOSIS — R748 Abnormal levels of other serum enzymes: Secondary | ICD-10-CM

## 2013-01-23 DIAGNOSIS — E669 Obesity, unspecified: Secondary | ICD-10-CM | POA: Insufficient documentation

## 2013-01-23 DIAGNOSIS — M2550 Pain in unspecified joint: Secondary | ICD-10-CM

## 2013-01-23 LAB — ROCKY MTN SPOTTED FVR ABS PNL(IGG+IGM)
RMSF IgG: 0.09 IV
RMSF IgM: 0.09 IV

## 2013-01-23 LAB — TSH: TSH: 7.182 u[IU]/mL — ABNORMAL HIGH (ref 0.350–4.500)

## 2013-01-23 LAB — RHEUMATOID FACTOR: Rhuematoid fact SerPl-aCnc: <10 IU/mL (ref <=14)

## 2013-01-23 NOTE — Progress Notes (Signed)
Pre visit review using our clinic review tool, if applicable. No additional management support is needed unless otherwise documented below in the visit note. 

## 2013-01-23 NOTE — Patient Instructions (Signed)
Arthralgia  Your caregiver has diagnosed you as suffering from an arthralgia. Arthralgia means there is pain in a joint. This can come from many reasons including:  · Bruising the joint which causes soreness (inflammation) in the joint.  · Wear and tear on the joints which occur as we grow older (osteoarthritis).  · Overusing the joint.  · Various forms of arthritis.  · Infections of the joint.  Regardless of the cause of pain in your joint, most of these different pains respond to anti-inflammatory drugs and rest. The exception to this is when a joint is infected, and these cases are treated with antibiotics, if it is a bacterial infection.  HOME CARE INSTRUCTIONS   · Rest the injured area for as long as directed by your caregiver. Then slowly start using the joint as directed by your caregiver and as the pain allows. Crutches as directed may be useful if the ankles, knees or hips are involved. If the knee was splinted or casted, continue use and care as directed. If an stretchy or elastic wrapping bandage has been applied today, it should be removed and re-applied every 3 to 4 hours. It should not be applied tightly, but firmly enough to keep swelling down. Watch toes and feet for swelling, bluish discoloration, coldness, numbness or excessive pain. If any of these problems (symptoms) occur, remove the ace bandage and re-apply more loosely. If these symptoms persist, contact your caregiver or return to this location.  · For the first 24 hours, keep the injured extremity elevated on pillows while lying down.  · Apply ice for 15-20 minutes to the sore joint every couple hours while awake for the first half day. Then 03-04 times per day for the first 48 hours. Put the ice in a plastic bag and place a towel between the bag of ice and your skin.  · Wear any splinting, casting, elastic bandage applications, or slings as instructed.  · Only take over-the-counter or prescription medicines for pain, discomfort, or fever as  directed by your caregiver. Do not use aspirin immediately after the injury unless instructed by your physician. Aspirin can cause increased bleeding and bruising of the tissues.  · If you were given crutches, continue to use them as instructed and do not resume weight bearing on the sore joint until instructed.  Persistent pain and inability to use the sore joint as directed for more than 2 to 3 days are warning signs indicating that you should see a caregiver for a follow-up visit as soon as possible. Initially, a hairline fracture (break in bone) may not be evident on X-rays. Persistent pain and swelling indicate that further evaluation, non-weight bearing or use of the joint (use of crutches or slings as instructed), or further X-rays are indicated. X-rays may sometimes not show a small fracture until a week or 10 days later. Make a follow-up appointment with your own caregiver or one to whom we have referred you. A radiologist (specialist in reading X-rays) may read your X-rays. Make sure you know how you are to obtain your X-ray results. Do not assume everything is normal if you do not hear from us.  SEEK MEDICAL CARE IF:  Bruising, swelling, or pain increases.  SEEK IMMEDIATE MEDICAL CARE IF:   · Your fingers or toes are numb or blue.  · The pain is not responding to medications and continues to stay the same or get worse.  · The pain in your joint becomes severe.  · You   develop a fever over 102° F (38.9° C).  · It becomes impossible to move or use the joint.  MAKE SURE YOU:   · Understand these instructions.  · Will watch your condition.  · Will get help right away if you are not doing well or get worse.  Document Released: 01/08/2005 Document Revised: 04/02/2011 Document Reviewed: 08/27/2007  ExitCare® Patient Information ©2014 [employer].

## 2013-01-23 NOTE — Assessment & Plan Note (Signed)
Seems to be improving  Labs still pending

## 2013-01-23 NOTE — Progress Notes (Signed)
   Subjective:    Patient ID: Kristin Hubbard, female    DOB: 09-Aug-1960, 53 y.o.   MRN: [id number]  HPI Pt actually feeling much better but had a fever yesterday.  Other symptoms are gone. She does have joint pains still and referral to rheum was questioned.  No other new symptoms.     Review of Systems As above    Objective:   Physical Exam  BP 122/68  Pulse 98  Temp(Src) 99 F (37.2 C) (Oral)  Wt 265 lb (120.203 kg)  SpO2 95% General appearance: alert, cooperative, appears stated age and no distress Lungs: clear to auscultation bilaterally Heart: S1, S2 normal Extremities: extremities normal, atraumatic, no cyanosis or edema      Assessment & Plan:

## 2013-01-26 ENCOUNTER — Telehealth: Payer: Self-pay | Admitting: Family Medicine

## 2013-01-26 ENCOUNTER — Ambulatory Visit (HOSPITAL_BASED_OUTPATIENT_CLINIC_OR_DEPARTMENT_OTHER)
Admission: RE | Admit: 2013-01-26 | Discharge: 2013-01-26 | Disposition: A | Discharge status: Home / Self Care | Payer: BC Managed Care – PPO | Source: Ambulatory Visit | Attending: Family Medicine | Admitting: Family Medicine

## 2013-01-26 ENCOUNTER — Other Ambulatory Visit: Payer: Self-pay

## 2013-01-26 DIAGNOSIS — R748 Abnormal levels of other serum enzymes: Secondary | ICD-10-CM

## 2013-01-26 DIAGNOSIS — R7989 Other specified abnormal findings of blood chemistry: Secondary | ICD-10-CM | POA: Insufficient documentation

## 2013-01-26 DIAGNOSIS — Z9089 Acquired absence of other organs: Secondary | ICD-10-CM | POA: Insufficient documentation

## 2013-01-26 LAB — ANA: Anti Nuclear Antibody(ANA): NEGATIVE

## 2013-01-26 LAB — CULTURE, BLOOD (SINGLE)
Organism ID, Bacteria: NO GROWTH
Organism ID, Bacteria: NO GROWTH

## 2013-01-26 MED ORDER — POTASSIUM CHLORIDE CRYS ER 20 MEQ PO TBCR: 20.0000 meq | EXTENDED_RELEASE_TABLET | Freq: Once | ORAL | Status: DC

## 2013-01-26 NOTE — Telephone Encounter (Signed)
Note given to the patient.       KP

## 2013-01-26 NOTE — Telephone Encounter (Signed)
Ok to go back to work

## 2013-01-26 NOTE — Telephone Encounter (Signed)
Please advise on work note.     KP 

## 2013-01-26 NOTE — Telephone Encounter (Signed)
Patient states that her fever is gone and is requesting a letter to return to work today. She wants to come by and pick up after her x-ray this morning. Patient also says that her Potassium was not called in to her pharmacy. She needs sent to [facility] on [address].

## 2013-02-05 ENCOUNTER — Encounter: Payer: Self-pay | Admitting: Family Medicine

## 2013-02-05 DIAGNOSIS — R609 Edema, unspecified: Secondary | ICD-10-CM

## 2013-02-05 DIAGNOSIS — E785 Hyperlipidemia, unspecified: Secondary | ICD-10-CM

## 2013-02-05 DIAGNOSIS — I10 Essential (primary) hypertension: Secondary | ICD-10-CM

## 2013-02-05 MED ORDER — SIMVASTATIN 40 MG PO TABS: 40.0000 mg | ORAL_TABLET | Freq: Every day | ORAL | Status: DC

## 2013-02-05 MED ORDER — LEVOTHYROXINE SODIUM 150 MCG PO TABS: ORAL_TABLET | ORAL | Status: DC

## 2013-02-05 MED ORDER — AMLODIPINE BESYLATE 5 MG PO TABS: 5.0000 mg | ORAL_TABLET | Freq: Every day | ORAL | Status: DC

## 2013-02-05 MED ORDER — FUROSEMIDE 20 MG PO TABS: 20.0000 mg | ORAL_TABLET | Freq: Every day | ORAL | Status: DC

## 2013-05-14 ENCOUNTER — Other Ambulatory Visit: Payer: Self-pay | Admitting: Family Medicine

## 2013-07-13 ENCOUNTER — Ambulatory Visit: Payer: Self-pay | Admitting: Podiatry

## 2013-07-15 ENCOUNTER — Encounter: Payer: Self-pay | Admitting: Podiatry

## 2013-07-15 ENCOUNTER — Ambulatory Visit (INDEPENDENT_AMBULATORY_CARE_PROVIDER_SITE_OTHER): Payer: BC Managed Care – PPO | Admitting: Podiatry

## 2013-07-15 ENCOUNTER — Ambulatory Visit (INDEPENDENT_AMBULATORY_CARE_PROVIDER_SITE_OTHER): Payer: BC Managed Care – PPO

## 2013-07-15 VITALS — BP 139/77 | HR 80 | Resp 16

## 2013-07-15 DIAGNOSIS — M2040 Other hammer toe(s) (acquired), unspecified foot: Secondary | ICD-10-CM

## 2013-07-15 DIAGNOSIS — M2010 Hallux valgus (acquired), unspecified foot: Secondary | ICD-10-CM

## 2013-07-15 DIAGNOSIS — M2042 Other hammer toe(s) (acquired), left foot: Secondary | ICD-10-CM

## 2013-07-15 NOTE — Patient Instructions (Signed)
Hammer Toes Hammer toes is a condition in which one or more of your toes is permanently flexed. CAUSES  This happens when a muscle imbalance or abnormal bone length makes your small toes buckle. This causes the toe joint to contract and the strong cord-like bands that attach muscles to the bones (tendons) in your toes to shorten.  SIGNS AND SYMPTOMS  Common symptoms of flexible hammer toes include:   A buildup of skin cells (corns). Corns occur where boney bumps come in frequent contact with hard surfaces. For example, where your shoes press and rub.  Irritation.  Inflammation.  Pain.  Limited motion in your toes. DIAGNOSIS  Hammer toes are diagnosed through a physical exam of your toes. During the exam, your health care provider may try to reproduce your symptoms by manipulating your foot. Often, X-ray exams are done to determine the degree of deformity and to make sure that the cause is not a fracture.  TREATMENT  Hammer toes can be treated with corrective surgery. There are several types of surgical procedures that can treat hammer toes. The most common procedures include:  Arthroplasty--A portion of the joint is surgically removed and your toe is straightened. The gap fills in with fibrous tissue. This procedure helps treat pain and deformity and helps restore function.  Fusion--Cartilage between the two bones of the affected joint is taken out and the bones fuse together into one longer bone. This helps keep your toe stable and reduces pain but leaves your toe stiff, yet straight.  Implantation--A portion of your bone is removed and replaced with an implant to restore motion.  Flexor tendon transfers--This procedure repositions the tendons that curl the toes down (flexor tendons). This may be done to release the deforming force that causes your toe to buckle. Several of these procedures require fixing your toe with a pin that is visible at the tip of your toe. The pin keeps the toe  straight during healing. Your health care provider will remove the pin usually within 4-8 weeks after the procedure.  Document Released: 01/06/2000 Document Revised: 01/13/2013 Document Reviewed: 09/15/2012 ExitCare Patient Information 2015 [employer]. This information is not intended to replace advice given to you by your health care provider. Make sure you discuss any questions you have with your health care provider. Bunion (Hallux Valgus) A bony bump (protrusion) on the inside of the foot, at the base of the first toe, is called a bunion (hallux valgus). A bunion causes the first toe to angle toward the other toes. SYMPTOMS   A bony bump on the inside of the foot, causing an outward turning of the first toe. It may also overlap the second toe.  Thickening of the skin (callus) over the bony bump.  Fluid buildup under the callus. Fluid may become red, tender, and swollen (inflamed) with constant irritation or pressure.  Foot pain and stiffness. CAUSES  Many causes exist, including:  Inherited from your family (genetics).  Injury (trauma) forcing the first toe into a position in which it overlaps other toes.  Bunions are also associated with wearing shoes that have a narrow toe box (pointy shoes). RISK INCREASES WITH:  Family history of foot abnormalities, especially bunions.  Arthritis.  Narrow shoes, especially high heels. PREVENTION  Wear shoes with a wide toe box.  Avoid shoes with high heels.  Wear a small pad between the big toe and second toe.  Maintain proper conditioning:  Foot and ankle flexibility.  Muscle strength and endurance. PROGNOSIS    With proper treatment, bunions can typically be cured. Occasionally, surgery is required.  RELATED COMPLICATIONS   Infection of the bunion.  Arthritis of the first toe.  Risks of surgery, including infection, bleeding, injury to nerves (numb toe), recurrent bunion, overcorrection (toe points inward), arthritis of  the big toe, big toe pointing upward, and bone not healing. TREATMENT  Treatment first consists of stopping the activities that aggravate the pain, taking pain medicines, and icing to reduce inflammation and pain. Wear shoes with a wide toe box. Shoes can be modified by a shoe repair person to relieve pressure on the bunion, especially if you cannot find shoes with a wide enough toe box. You may also place a pad with the center cut out in your shoe, to reduce pressure on the bunion. Sometimes, an arch support (orthotic) may reduce pressure on the bunion and alleviate the symptoms. Stretching and strengthening exercises for the muscles of the foot may be useful. You may choose to wear a brace or pad at night to hold the big toe away from the second toe. If non-surgical treatments are not successful, surgery may be needed. Surgery involves removing the overgrown tissue and correcting the position of the first toe, by realigning the bones. Bunion surgery is typically performed on an outpatient basis, meaning you can go home the same day as surgery. The surgery may involve cutting the mid portion of the bone of the first toe, or just cutting and repairing (reconstructing) the ligaments and soft tissues around the first toe.  MEDICATION   If pain medicine is needed, nonsteroidal anti-inflammatory medicines, such as aspirin and ibuprofen, or other minor pain relievers, such as acetaminophen, are often recommended.  Do not take pain medicine for 7 days before surgery.  Prescription pain relievers are usually only prescribed after surgery. Use only as directed and only as much as you need.  Ointments applied to the skin may be helpful. HEAT AND COLD  Cold treatment (icing) relieves pain and reduces inflammation. Cold treatment should be applied for 10 to 15 minutes every 2 to 3 hours for inflammation and pain and immediately after any activity that aggravates your symptoms. Use ice packs or an ice  massage.  Heat treatment may be used prior to performing the stretching and strengthening activities prescribed by your caregiver, physical therapist, or athletic trainer. Use a heat pack or a warm soak. SEEK MEDICAL CARE IF:   Symptoms get worse or do not improve in 2 weeks, despite treatment.  After surgery, you develop fever, increasing pain, redness, swelling, drainage of fluids, bleeding, or increasing warmth around the surgical area.  New, unexplained symptoms develop. (Drugs used in treatment may produce side effects.) Document Released: 01/08/2005 Document Revised: 04/02/2011 Document Reviewed: 04/22/2008 ExitCare Patient Information 2015 [employer]. This information is not intended to replace advice given to you by your health care provider. Make sure you discuss any questions you have with your health care provider.  

## 2013-07-15 NOTE — Progress Notes (Signed)
   Subjective:    Patient ID: Kristin Hubbard, female    DOB: 06-08-1960, 53 y.o.   MRN: [id number]  HPI Comments: "I have this toe that hurts"  Patient c/o aching 2nd toe left for several months. Says that its been this way for awhile but recently started to become painful. Shoes are uncomfortable.  Patient also has bunions bilateral.  Toe Pain       Review of Systems  All other systems reviewed and are negative.      Objective:   Physical Exam        Assessment & Plan:

## 2013-07-16 NOTE — Progress Notes (Signed)
Subjective:     Patient ID: Kristin Hubbard, female   DOB: 08/17/1960, 53 y.o.   MRN: [id number]  Toe Pain    patient presents stating I'm having a lot of problems with my left foot with elevation of the second toe and structural bunion and flatfoot deformity noted. States the flatfeet do not bother her much but she is having increased trouble finding shoe gear to accommodate the second toe left over right also having deformity on the right foot   Review of Systems  All other systems reviewed and are negative.      Objective:   Physical Exam  Nursing note and vitals reviewed. Constitutional: She is oriented to person, place, and time.  Cardiovascular: Intact distal pulses.   Musculoskeletal: Normal range of motion.  Neurological: She is oriented to person, place, and time.  Skin: Skin is warm.   neurovascular status is intact with range of motion subtalar midtarsal joint mildly diminished but intact. Muscle strength within normal limits and mild equinus condition was noted bilateral. I noted digits to be well perfused and diminishment of arch height upon weightbearing. 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 Patient would like to do something but due to her work status needs the something that she can weight-bear and return to work fairly quickly I did discuss aggressive Austin osteotomy left along with possible Akin osteotomy digital fusion. We will not be able to achieve complete radiographic correction but I do think will be able to put the foot and a much  better functioning position and allow her to wear shoe gear comfortably. This is what she wants and she is scheduled for consult in late August and tentatively surgery to follow that

## 2013-08-04 ENCOUNTER — Telehealth: Payer: Self-pay | Admitting: *Deleted

## 2013-08-04 NOTE — Telephone Encounter (Signed)
Calling to schedule my surgery for Hammer Toe with Dr. Charlsie Merlesegal.  I returned her call.  She asked for her surgery to be scheduled for 09/08/2013.  I asked if she had signed her consent forms yet.  She stated no, she will check her schedule on tomorrow and call and make an appointment.

## 2013-08-14 ENCOUNTER — Other Ambulatory Visit: Payer: Self-pay | Admitting: Family Medicine

## 2013-08-14 NOTE — Telephone Encounter (Signed)
Pt made aware of refill x 1 month and need for office visit.  Pt stated that she would call back to schedule appointment.

## 2013-08-14 NOTE — Telephone Encounter (Signed)
Refill Request:  levothyroxine (SYNTHROID, LEVOTHROID) 150 MCG tablet--TAKE ONE TABLET BY MOUTH EVERY DAY  Last Filled:  02/05/13 Amt Filled:  90 tablets, 1 refill Last OV:  01/23/13 Last TSH:  01/20/13- HIGH--Dr. Laury AxonLowne advised a repeat in 2 month.  Medication refilled per Prescription Refill Protocol.  According to protocol, patient needs an office visit.  Called and left a message for call back.

## 2013-09-03 ENCOUNTER — Ambulatory Visit (INDEPENDENT_AMBULATORY_CARE_PROVIDER_SITE_OTHER): Payer: BC Managed Care – PPO | Admitting: Podiatry

## 2013-09-03 ENCOUNTER — Encounter: Payer: Self-pay | Admitting: Podiatry

## 2013-09-03 ENCOUNTER — Telehealth: Payer: Self-pay

## 2013-09-03 VITALS — BP 135/74 | HR 80 | Resp 12

## 2013-09-03 DIAGNOSIS — M2010 Hallux valgus (acquired), unspecified foot: Secondary | ICD-10-CM

## 2013-09-03 DIAGNOSIS — M205X9 Other deformities of toe(s) (acquired), unspecified foot: Secondary | ICD-10-CM

## 2013-09-03 DIAGNOSIS — M2042 Other hammer toe(s) (acquired), left foot: Secondary | ICD-10-CM

## 2013-09-03 DIAGNOSIS — M2040 Other hammer toe(s) (acquired), unspecified foot: Secondary | ICD-10-CM

## 2013-09-03 MED ORDER — OXYCODONE-ACETAMINOPHEN 10-325 MG PO TABS
1.0000 | ORAL_TABLET | ORAL | Status: DC | PRN
Start: 2013-09-03 — End: 2013-09-07

## 2013-09-03 NOTE — Progress Notes (Signed)
Subjective:     Patient ID: Kristin Hubbard, feHubert Azuremale   DOB: [birth year], 53 y.o.   MRN: [id number]  HPI patient presents to discuss the surgical correction of the left foot which will take place next week.   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 I reviewed with the patient the procedures that we'll be done and all possible complications as outlined in the consent form and the fact will not be able to get complete correction but she will be able to weight-bear which is what she needs to do secondary to her job and I do think we'll be able to put this into position where a well-functioning adequately for her and she completely understands we will not get full correction area total recovery. For this is 6 months to one year and air fracture walker was dispensed today with instructions on usage. Patient signed consent form was given preoperative instructions and was instructed to call us if she has any further questions prior to surgery

## 2013-09-03 NOTE — Patient Instructions (Signed)
Pre-Operative Instructions  Congratulations, you have decided to take an important step to improving your quality of life.  You can be assured that the doctors of [facility] will be with you every step of the way.  1. Plan to be at the surgery center/hospital at least 1 (one) hour prior to your scheduled time unless otherwise directed by the surgical center/hospital staff.  You must have a responsible adult accompany you, remain during the surgery and drive you home.  Make sure you have directions to the surgical center/hospital and know how to get there on time. 2. For hospital based surgery you will need to obtain a history and physical form from your family physician within 1 month prior to the date of surgery- we will give you a form for you primary physician.  3. We make every effort to accommodate the date you request for surgery.  There are however, times where surgery dates or times have to be moved.  We will contact you as soon as possible if a change in schedule is required.   4. No Aspirin/Ibuprofen for one week before surgery.  If you are on aspirin, any non-steroidal anti-inflammatory medications (Mobic, Aleve, Ibuprofen) you should stop taking it 7 days prior to your surgery.  You make take Tylenol  For pain prior to surgery.  5. Medications- If you are taking daily heart and blood pressure medications, seizure, reflux, allergy, asthma, anxiety, pain or diabetes medications, make sure the surgery center/hospital is aware before the day of surgery so they may notify you which medications to take or avoid the day of surgery. 6. No food or drink after midnight the night before surgery unless directed otherwise by surgical center/hospital staff. 7. No alcoholic beverages 24 hours prior to surgery.  No smoking 24 hours prior to or 24 hours after surgery. 8. Wear loose pants or shorts- loose enough to fit over bandages, boots, and casts. 9. No slip on shoes, sneakers are best. 10. Bring  your boot with you to the surgery center/hospital.  Also bring crutches or a walker if your physician has prescribed it for you.  If you do not have this equipment, it will be provided for you after surgery. 11. If you have not been contracted by the surgery center/hospital by the day before your surgery, call to confirm the date and time of your surgery. 12. Leave-time from work may vary depending on the type of surgery you have.  Appropriate arrangements should be made prior to surgery with your employer. 13. Prescriptions will be provided immediately following surgery by your doctor.  Have these filled as soon as possible after surgery and take the medication as directed. 14. Remove nail polish on the operative foot. 15. Wash the night before surgery.  The night before surgery wash the foot and leg well with the antibacterial soap provided and water paying special attention to beneath the toenails and in between the toes.  Rinse thoroughly with water and dry well with a towel.  Perform this wash unless told not to do so by your physician.  Enclosed: 1 Ice pack (please put in freezer the night before surgery)   1 Hibiclens skin cleaner   Pre-op Instructions  If you have any questions regarding the instructions, do not hesitate to call our office.  [place]: [address] , [place] [address] [phone]  [place]: [address] [place], [place] [address] [phone]  [place]: [address]  [place], [place] [address] [phone]  Dr. Richard   Tuchman DPM, Dr. Norman Regal DPM Dr. Richard Sikora DPM, Dr. M. Todd Hyatt DPM, Dr. Kathryn Egerton DPM 

## 2013-09-04 NOTE — Telephone Encounter (Signed)
Signed surgical consent forms

## 2013-09-07 ENCOUNTER — Ambulatory Visit (INDEPENDENT_AMBULATORY_CARE_PROVIDER_SITE_OTHER): Payer: BC Managed Care – PPO | Admitting: Family Medicine

## 2013-09-07 ENCOUNTER — Encounter: Payer: Self-pay | Admitting: Family Medicine

## 2013-09-07 VITALS — BP 140/78 | HR 82 | Temp 97.8°F | Wt 259.0 lb

## 2013-09-07 DIAGNOSIS — I10 Essential (primary) hypertension: Secondary | ICD-10-CM

## 2013-09-07 DIAGNOSIS — E785 Hyperlipidemia, unspecified: Secondary | ICD-10-CM

## 2013-09-07 DIAGNOSIS — B354 Tinea corporis: Secondary | ICD-10-CM

## 2013-09-07 DIAGNOSIS — R21 Rash and other nonspecific skin eruption: Secondary | ICD-10-CM

## 2013-09-07 MED ORDER — HYDROCORTISONE VALERATE 0.2 % EX OINT: 1.0000 "application " | TOPICAL_OINTMENT | Freq: Two times a day (BID) | CUTANEOUS | Status: DC

## 2013-09-07 MED ORDER — NYSTATIN 100000 UNIT/GM EX CREA: 1.0000 "application " | TOPICAL_CREAM | Freq: Two times a day (BID) | CUTANEOUS | Status: DC

## 2013-09-07 NOTE — Patient Instructions (Signed)

## 2013-09-07 NOTE — Progress Notes (Signed)
   Subjective:    Patient ID: Kristin Hubbard, female    DOB: 07-Sep-1960, 53 y.o.   MRN: [id number]  HPI Pt here c/o rash on R leg that is worse with heat and is itching.  She has tried otc meds with no relief.  She also c/o rash in folds of skin.  She is using gold bond powder but it is caking. Pt is also her f/u htn and cholesterol.  She is still working out with trainer.      Review of Systems As above    Objective:   Physical Exam BP 140/78  Pulse 82  Temp(Src) 97.8 F (36.6 C) (Oral)  Wt 259 lb (117.482 kg)  SpO2 95% General appearance: alert, cooperative, appears stated age and no distress Head: Normocephalic, without obvious abnormality, atraumatic Neck: no adenopathy, supple, symmetrical, trachea midline and thyroid not enlarged, symmetric, no tenderness/mass/nodules Lungs: clear to auscultation bilaterally Heart: S1, S2 normal Extremities: extremities normal, atraumatic, no cyanosis or edema Skin: Skin color, texture, turgor normal. No rashes or lesions Lymph nodes: Cervical, supraclavicular, and axillary nodes normal.        Assessment & Plan:  1. Rash and nonspecific skin eruption  - hydrocortisone valerate ointment (WESTCORT) 0.2 %; Apply 1 application topically 2 (two) times daily.  Dispense: 45 g; Refill: 0  2. Other and unspecified hyperlipidemia Check labs - Basic metabolic panel - Hepatic function panel - Lipid panel - TSH  3. Essential hypertension Stable, con't meds  4. Tinea corporis   - nystatin cream (MYCOSTATIN); Apply 1 application topically 2 (two) times daily.  Dispense: 30 g; Refill: 0

## 2013-09-07 NOTE — Progress Notes (Signed)
Pre visit review using our clinic review tool, if applicable. No additional management support is needed unless otherwise documented below in the visit note. 

## 2013-09-08 ENCOUNTER — Telehealth: Payer: Self-pay | Admitting: Family Medicine

## 2013-09-08 DIAGNOSIS — M2040 Other hammer toe(s) (acquired), unspecified foot: Secondary | ICD-10-CM

## 2013-09-08 DIAGNOSIS — M2010 Hallux valgus (acquired), unspecified foot: Secondary | ICD-10-CM

## 2013-09-08 LAB — HEPATIC FUNCTION PANEL
ALK PHOS: 59 U/L (ref 39–117)
ALT: 17 U/L (ref 0–35)
AST: 15 U/L (ref 0–37)
Albumin: 3.9 g/dL (ref 3.5–5.2)
BILIRUBIN DIRECT: 0.1 mg/dL (ref 0.0–0.3)
BILIRUBIN TOTAL: 0.6 mg/dL (ref 0.2–1.2)
Total Protein: 7.2 g/dL (ref 6.0–8.3)

## 2013-09-08 LAB — BASIC METABOLIC PANEL
BUN: 13 mg/dL (ref 6–23)
CHLORIDE: 105 meq/L (ref 96–112)
CO2: 26 mEq/L (ref 19–32)
Calcium: 9.2 mg/dL (ref 8.4–10.5)
Creatinine, Ser: 0.7 mg/dL (ref 0.4–1.2)
GFR: 95.97 mL/min (ref >60.00)
Glucose, Bld: 80 mg/dL (ref 70–99)
Potassium: 3.8 mEq/L (ref 3.5–5.1)
SODIUM: 140 meq/L (ref 135–145)

## 2013-09-08 LAB — LIPID PANEL
CHOL/HDL RATIO: 6
CHOLESTEROL: 208 mg/dL — AB (ref 0–200)
HDL: 37.3 mg/dL — ABNORMAL LOW (ref >39.00)
LDL CALC: 146 mg/dL — AB (ref 0–99)
NonHDL: 170.7
Triglycerides: 126 mg/dL (ref 0.0–149.0)
VLDL: 25.2 mg/dL (ref 0.0–40.0)

## 2013-09-08 LAB — TSH: TSH: 2.14 u[IU]/mL (ref 0.35–4.50)

## 2013-09-08 NOTE — Telephone Encounter (Signed)
Relevant patient education assigned to patient using Emmi. ° °

## 2013-09-09 ENCOUNTER — Telehealth: Payer: Self-pay

## 2013-09-09 ENCOUNTER — Encounter: Payer: Self-pay | Admitting: Podiatry

## 2013-09-09 ENCOUNTER — Other Ambulatory Visit: Payer: Self-pay

## 2013-09-09 DIAGNOSIS — E785 Hyperlipidemia, unspecified: Secondary | ICD-10-CM

## 2013-09-09 MED ORDER — SIMVASTATIN 40 MG PO TABS: 40.0000 mg | ORAL_TABLET | Freq: Every day | ORAL | Status: DC

## 2013-09-09 NOTE — Telephone Encounter (Signed)
Left message for pt to call with questions or concerns regarding post operative state

## 2013-09-14 ENCOUNTER — Encounter: Payer: Self-pay | Admitting: Podiatry

## 2013-09-14 ENCOUNTER — Ambulatory Visit (INDEPENDENT_AMBULATORY_CARE_PROVIDER_SITE_OTHER): Payer: BC Managed Care – PPO

## 2013-09-14 ENCOUNTER — Ambulatory Visit (INDEPENDENT_AMBULATORY_CARE_PROVIDER_SITE_OTHER): Payer: BC Managed Care – PPO | Admitting: Podiatry

## 2013-09-14 ENCOUNTER — Encounter: Payer: BC Managed Care – PPO | Admitting: Podiatry

## 2013-09-14 VITALS — BP 127/58 | HR 82 | Resp 15 | Ht 67.5 in | Wt 250.0 lb

## 2013-09-14 DIAGNOSIS — M2010 Hallux valgus (acquired), unspecified foot: Secondary | ICD-10-CM

## 2013-09-14 DIAGNOSIS — M2040 Other hammer toe(s) (acquired), unspecified foot: Secondary | ICD-10-CM

## 2013-09-14 DIAGNOSIS — S92309A Fracture of unspecified metatarsal bone(s), unspecified foot, initial encounter for closed fracture: Secondary | ICD-10-CM

## 2013-09-14 NOTE — Progress Notes (Signed)
Subjective:     Patient ID: Kristin Hubbard, female   DOB: 1960/04/24, 53 y.o.   MRN: [id number]  HPI patient presents stating I have been doing well with my left foot and I been having minimal discomfort and swelling and I am returning to work today   Review of Systems     Objective:   Physical Exam Neurovascular status intact with muscle strength adequate and I noted that the left foot clinically looks really good with good alignment of the hallux second toe and reduced prominence of the first metatarsal head. Negative Homans sign was noted and digits are well-perfused and wound edges are well coapted with no drainage noted    Assessment:     Doing well post forefoot reconstruction left    Plan:     X-ray taken and reviewed. I did explain that there is a small crack line in the first metatarsal but I do believe it'll be stable at this position but I explained the absolute importance of not going without immobilization and not walking on this side of the foot. Patient understands this and was dispensed a Darco shoe and she needs to wear this part time and it we'll continue to give her immobilization as is rigid and she will be seen back in 2 weeks for reevaluation and removal of stitches earlier if any issues should occur.

## 2013-09-17 ENCOUNTER — Telehealth: Payer: Self-pay | Admitting: *Deleted

## 2013-09-17 NOTE — Telephone Encounter (Signed)
I called and left her a message that Dr. Charlsie Merles said to try some Epsom Salt water soaks.  Do it twice a day if you can, 15 minutes each time.  Use one tablespoon of Epsom Salt per quart of water.  Call if you have any further questions on tomorrow.

## 2013-09-17 NOTE — Telephone Encounter (Signed)
I'm trying to reach the nurse of Dr. Charlsie Merles.  I'm having some issues with my toe.  I'm a little concerned about.  Please give me a call back at work at the [employer].  I returned her call.  She stated the big toe feels like it has frostbite.  If I have it elevated it is fine. As soon as I put it down it comes back.  When I walk it is fine. It feels icy cold and then it starts to burn.  I don't think it is from swelling.  What does he suggest I do?  I told her I would ask him, see what he suggests and give you a call back.  She stated okay.

## 2013-09-22 ENCOUNTER — Telehealth: Payer: Self-pay | Admitting: *Deleted

## 2013-09-22 NOTE — Telephone Encounter (Signed)
I had surgery 2 weeks ago for a Hammertoe and Bunionectomy.  I'm just experiencing a whole lot of pain and swelling and some weird sensations.  I know he's in surgery today but I need to know what to do.  This is unbearable at times.  You can call me at work.  I work at the [employer] near you.  Thank you.

## 2013-09-22 NOTE — Telephone Encounter (Signed)
I called and informed her that Dr. Charlsie Merles wants her to come in and see him on tomorrow.  She stated okay.  I transferred her to a scheduler.

## 2013-09-23 ENCOUNTER — Ambulatory Visit (INDEPENDENT_AMBULATORY_CARE_PROVIDER_SITE_OTHER): Payer: BC Managed Care – PPO | Admitting: Podiatry

## 2013-09-23 ENCOUNTER — Ambulatory Visit (INDEPENDENT_AMBULATORY_CARE_PROVIDER_SITE_OTHER): Payer: BC Managed Care – PPO

## 2013-09-23 ENCOUNTER — Encounter: Payer: Self-pay | Admitting: Podiatry

## 2013-09-23 VITALS — BP 142/81 | HR 88 | Resp 16

## 2013-09-23 DIAGNOSIS — Z9889 Other specified postprocedural states: Secondary | ICD-10-CM

## 2013-09-23 DIAGNOSIS — S92309A Fracture of unspecified metatarsal bone(s), unspecified foot, initial encounter for closed fracture: Secondary | ICD-10-CM

## 2013-09-23 DIAGNOSIS — R609 Edema, unspecified: Secondary | ICD-10-CM

## 2013-09-23 MED ORDER — OXYCODONE-ACETAMINOPHEN 10-325 MG PO TABS: 1.0000 | ORAL_TABLET | Freq: Three times a day (TID) | ORAL | Status: DC | PRN

## 2013-09-23 NOTE — Progress Notes (Signed)
Subjective:     Patient ID: Kristin Hubbard, female   DOB: Jan 24, 1960, 53 y.o.   MRN: [id number]  HPI patient states I'm still having a lot of pain and swelling and I wanted to get it checked but the pains I was getting seem to be somewhat better   Review of Systems     Objective:   Physical Exam Neurovascular status is intact with well-healing surgical site first metatarsal left second toe left with wound edges well coapted and no indications of drainage or redness but does have some edema around the first metatarsal head left with good range of motion currently of 25 dorsiflexion 20 plantar flexion with no crepitus noted    Assessment:     Does have unfortunate crack of the first metatarsal but I do think most likely it's stable with inflammation and pain associated with it    Plan:     I did rex-ray as a precautionary measure and then applied Unna boot Ace wrap to the area with explanation on this and reappoint for [place] to recheck again in the next week for stitch removal and to be seen back after removal

## 2013-09-30 ENCOUNTER — Ambulatory Visit (INDEPENDENT_AMBULATORY_CARE_PROVIDER_SITE_OTHER): Payer: BC Managed Care – PPO | Admitting: Podiatry

## 2013-09-30 ENCOUNTER — Encounter: Payer: Self-pay | Admitting: Podiatry

## 2013-09-30 VITALS — BP 125/78 | HR 74 | Resp 17

## 2013-09-30 DIAGNOSIS — R609 Edema, unspecified: Secondary | ICD-10-CM

## 2013-09-30 DIAGNOSIS — M2010 Hallux valgus (acquired), unspecified foot: Secondary | ICD-10-CM

## 2013-09-30 NOTE — Progress Notes (Signed)
   Subjective:    Patient ID: Kristin Hubbard, female    DOB: 09/17/1960, 53 y.o.   MRN: [id number]  HPI Pt presents for suture removal   Review of Systems     Objective:   Physical Exam        Assessment & Plan:

## 2013-10-01 NOTE — Progress Notes (Signed)
Subjective:     Patient ID: Kristin Hubbard, female   DOB: 10-03-1960, 53 y.o.   MRN: [id number]  HPI patient presents for suture removal stating her foot feels better in 3 weeks after surgery left   Review of Systems     Objective:   Physical Exam Neurovascular status intact with incision site healing well wound edges well coapted and stitches in place    Assessment:     Doing well post surgery left and no longer is having the same types of feeling she had previously    Plan:     Stitches removed wound edges coapted well dressing applied and reappoint 2 weeks earlier if any issues should occur

## 2013-10-12 ENCOUNTER — Ambulatory Visit (INDEPENDENT_AMBULATORY_CARE_PROVIDER_SITE_OTHER): Payer: BC Managed Care – PPO | Admitting: Podiatry

## 2013-10-12 ENCOUNTER — Ambulatory Visit (INDEPENDENT_AMBULATORY_CARE_PROVIDER_SITE_OTHER): Payer: BC Managed Care – PPO

## 2013-10-12 ENCOUNTER — Encounter: Payer: Self-pay | Admitting: Podiatry

## 2013-10-12 VITALS — BP 157/82 | HR 74 | Resp 16

## 2013-10-12 DIAGNOSIS — M2040 Other hammer toe(s) (acquired), unspecified foot: Secondary | ICD-10-CM

## 2013-10-12 DIAGNOSIS — M2042 Other hammer toe(s) (acquired), left foot: Secondary | ICD-10-CM

## 2013-10-13 NOTE — Progress Notes (Signed)
Subjective:     Patient ID: Kristin Hubbard, female   DOB: Nov 01, 1960, 53 y.o.   MRN: [id number]  HPI patient states that she's doing well with moderate swelling around the first metatarsal head left but significant reduction of discomfort and able to resume most normal activities. She's continuing to keep the foot and mobilized until now   Review of Systems     Objective:   Physical Exam Neurovascular status found to be intact with pain intact second toe left and incision site well coapted. There is a moderate amount of edema around the first metatarsal head left consistent with distal fracture of the osteotomy which is stable at its current position with fixation in place. Range of motion of the joint is good with 30 of dorsiflexion 20 plantar flexion with no crepitus or restrictions noted currently    Assessment:     Recovering postoperatively from a fracture of the distal osteotomy that is stable and should hopefully over time completely heal without any arthritic changes.    Plan:     Pin removed from second toe and x-ray taken and reviewed. I explained that there still is healing to go and the possibility does exist long-term for arthritis and we again discussed the possibility for repositioning of the distal segment which she does not want to do now she feels like she is recovering well and she would rather see whether or not arthritis were to develop. I again offered her this opportunity and she's can hold off and I do think that over time this will heal uneventfully and that the swelling will resolve. She will continue with immobilization and gradual shoe gear usage over the next several weeks and will be seen back in 4 weeks unless any issues should occur

## 2013-10-20 ENCOUNTER — Other Ambulatory Visit: Payer: Self-pay

## 2013-10-20 MED ORDER — AMLODIPINE BESYLATE 5 MG PO TABS: ORAL_TABLET | ORAL | Status: DC

## 2013-10-21 ENCOUNTER — Other Ambulatory Visit: Payer: Self-pay

## 2013-10-21 MED ORDER — AMLODIPINE BESYLATE 5 MG PO TABS: ORAL_TABLET | ORAL | Status: DC

## 2013-11-03 ENCOUNTER — Ambulatory Visit: Payer: Self-pay

## 2013-11-03 ENCOUNTER — Encounter: Payer: BC Managed Care – PPO | Admitting: Podiatry

## 2013-11-05 ENCOUNTER — Ambulatory Visit (INDEPENDENT_AMBULATORY_CARE_PROVIDER_SITE_OTHER): Payer: BC Managed Care – PPO | Admitting: Podiatry

## 2013-11-05 ENCOUNTER — Encounter: Payer: Self-pay | Admitting: Podiatry

## 2013-11-05 ENCOUNTER — Ambulatory Visit (INDEPENDENT_AMBULATORY_CARE_PROVIDER_SITE_OTHER): Payer: BC Managed Care – PPO

## 2013-11-05 VITALS — BP 165/83 | HR 87 | Resp 16

## 2013-11-05 DIAGNOSIS — M2012 Hallux valgus (acquired), left foot: Secondary | ICD-10-CM

## 2013-11-05 DIAGNOSIS — M779 Enthesopathy, unspecified: Secondary | ICD-10-CM

## 2013-11-05 NOTE — Progress Notes (Signed)
Subjective:     Patient ID: Kristin Hubbard, female   DOB: Jun 09, 1960, 53 y.o.   MRN: [id number]  HPI patient states my foot is doing fine but my tendons do get irritated if I stand a long time and I'm very happy with my structural correction   Review of Systems     Objective:   Physical Exam Neurovascular status intact with well-healing surgical site first metatarsal left and second toe. Patient is noted to have good range of motion of the first MPJ left with no crepitus in the joint or other issues noted. Patient does have moderate midfoot discomfort secondary to chronic tendinitis    Assessment:     Healing well from surgery despite having had a fracture of the capital fragment with no apparent clinical symptoms and tendinitis bilateral    Plan:     Reviewed x-rays of foot and advised him gradual increase in activity and scanned for custom orthotics today reappoint when orthotics are returned

## 2013-11-09 NOTE — Progress Notes (Signed)
This encounter was created in error - please disregard.

## 2013-11-20 ENCOUNTER — Other Ambulatory Visit: Payer: Self-pay

## 2013-11-20 MED ORDER — LEVOTHYROXINE SODIUM 150 MCG PO TABS: ORAL_TABLET | ORAL | Status: DC

## 2013-12-11 ENCOUNTER — Ambulatory Visit: Payer: BC Managed Care – PPO

## 2013-12-11 DIAGNOSIS — M779 Enthesopathy, unspecified: Secondary | ICD-10-CM

## 2013-12-11 NOTE — Patient Instructions (Signed)

## 2014-02-25 ENCOUNTER — Encounter: Payer: Self-pay | Admitting: Family Medicine

## 2014-02-25 DIAGNOSIS — E785 Hyperlipidemia, unspecified: Secondary | ICD-10-CM

## 2014-02-25 MED ORDER — SIMVASTATIN 40 MG PO TABS: 40.0000 mg | ORAL_TABLET | Freq: Every day | ORAL | Status: DC

## 2014-02-25 MED ORDER — AMLODIPINE BESYLATE 5 MG PO TABS: ORAL_TABLET | ORAL | Status: DC

## 2014-02-25 MED ORDER — LEVOTHYROXINE SODIUM 150 MCG PO TABS
ORAL_TABLET | ORAL | Status: DC
Start: 2014-02-25 — End: 2014-09-08

## 2014-03-04 ENCOUNTER — Encounter: Payer: Self-pay | Admitting: Family Medicine

## 2014-03-04 ENCOUNTER — Telehealth: Payer: Self-pay | Admitting: Physician Assistant

## 2014-03-04 ENCOUNTER — Other Ambulatory Visit: Payer: Self-pay | Admitting: Physician Assistant

## 2014-03-04 DIAGNOSIS — H00019 Hordeolum externum unspecified eye, unspecified eyelid: Secondary | ICD-10-CM

## 2014-03-04 MED ORDER — ERYTHROMYCIN 5 MG/GM OP OINT: TOPICAL_OINTMENT | OPHTHALMIC | Status: DC

## 2014-03-04 NOTE — Telephone Encounter (Signed)
Rx faxed.    KP 

## 2014-03-04 NOTE — Telephone Encounter (Signed)
Erythromycin ophthalmic ointment apply 1 cm ribbon to eye 6x a day x 7 days

## 2014-03-04 NOTE — Progress Notes (Signed)
We are sorry that you are not feeling well.  Here is how we plan to help!  Based on your chart review and questionnaire you sent out, I see where Dr. Laury AxonLowne had intended for you to receive an antibiotic to treat the stye.  Since I actually work with her in clinic, I will resend the medication to the pharmacy you requested.  Get Help Right Away If:  Your symptoms do not improve.  You develop blurred or loss of vision.  Your symptoms worsen (increased discharge, pain or redness)  Your e-visit answers were reviewed by a board certified advanced clinical practitioner to complete your personal care plan.  Depending on the condition, your plan could have included both over the counter or prescription medications.  If there is a problem please reply  once you have received a response from your provider.  Your safety is important to us.  If you have drug allergies check your prescription carefully.    You can use MyChart to ask questions about today's visit, request a non-urgent call back, or ask for a work or school excuse.  You will get an e-mail in the next two days asking about your experience.  I hope that your e-visit has been valuable and will speed your recovery. Thank you for using e-visits.

## 2014-03-05 ENCOUNTER — Other Ambulatory Visit: Payer: Self-pay

## 2014-03-05 DIAGNOSIS — H00019 Hordeolum externum unspecified eye, unspecified eyelid: Secondary | ICD-10-CM

## 2014-03-05 MED ORDER — ERYTHROMYCIN 5 MG/GM OP OINT: TOPICAL_OINTMENT | OPHTHALMIC | Status: DC

## 2014-04-29 ENCOUNTER — Encounter: Payer: Self-pay | Admitting: Family Medicine

## 2014-05-17 ENCOUNTER — Telehealth: Payer: Self-pay | Admitting: Family Medicine

## 2014-05-17 NOTE — Telephone Encounter (Signed)
previsit letter for annual exam mailed  °

## 2014-05-21 ENCOUNTER — Other Ambulatory Visit: Payer: Self-pay | Admitting: Family Medicine

## 2014-06-04 ENCOUNTER — Encounter: Payer: Self-pay | Admitting: *Deleted

## 2014-06-04 ENCOUNTER — Telehealth: Payer: Self-pay | Admitting: *Deleted

## 2014-06-04 NOTE — Telephone Encounter (Signed)
Unable to reach patient at time of Pre-Visit Call.  Left message for patient to return call when available.    

## 2014-06-04 NOTE — Telephone Encounter (Signed)
Pre-Visit Call completed with patient and chart updated.   Pre-Visit Info documented in Specialty Comments under [employer].    

## 2014-06-04 NOTE — Addendum Note (Signed)
Addended by: Noreene LarssonLARSON, Nilam Quakenbush A on: 06/04/2014 12:10 PM   Modules accepted: Orders, Medications

## 2014-06-07 ENCOUNTER — Other Ambulatory Visit (HOSPITAL_COMMUNITY)
Admission: RE | Admit: 2014-06-07 | Discharge: 2014-06-07 | Disposition: A | Discharge status: Home / Self Care | Payer: BLUE CROSS/BLUE SHIELD | Source: Ambulatory Visit | Attending: Family Medicine | Admitting: Family Medicine

## 2014-06-07 ENCOUNTER — Encounter: Payer: Self-pay | Admitting: Family Medicine

## 2014-06-07 ENCOUNTER — Ambulatory Visit (INDEPENDENT_AMBULATORY_CARE_PROVIDER_SITE_OTHER): Payer: BLUE CROSS/BLUE SHIELD | Admitting: Family Medicine

## 2014-06-07 VITALS — BP 130/78 | HR 87 | Temp 97.9°F | Ht 68.0 in | Wt 272.0 lb

## 2014-06-07 DIAGNOSIS — I10 Essential (primary) hypertension: Secondary | ICD-10-CM

## 2014-06-07 DIAGNOSIS — E039 Hypothyroidism, unspecified: Secondary | ICD-10-CM

## 2014-06-07 DIAGNOSIS — Z01419 Encounter for gynecological examination (general) (routine) without abnormal findings: Secondary | ICD-10-CM | POA: Diagnosis present

## 2014-06-07 DIAGNOSIS — E669 Obesity, unspecified: Secondary | ICD-10-CM

## 2014-06-07 DIAGNOSIS — Z0000 Encounter for general adult medical examination without abnormal findings: Secondary | ICD-10-CM | POA: Diagnosis not present

## 2014-06-07 DIAGNOSIS — E785 Hyperlipidemia, unspecified: Secondary | ICD-10-CM

## 2014-06-07 LAB — CBC WITH DIFFERENTIAL/PLATELET
Basophils Absolute: 0 10*3/uL (ref 0.0–0.1)
Basophils Relative: 0.6 % (ref 0.0–3.0)
EOS PCT: 1.9 % (ref 0.0–5.0)
Eosinophils Absolute: 0.2 10*3/uL (ref 0.0–0.7)
HEMATOCRIT: 42.4 % (ref 36.0–46.0)
Hemoglobin: 14.6 g/dL (ref 12.0–15.0)
LYMPHS ABS: 2 10*3/uL (ref 0.7–4.0)
Lymphocytes Relative: 24.7 % (ref 12.0–46.0)
MCHC: 34.4 g/dL (ref 30.0–36.0)
MCV: 92.6 fl (ref 78.0–100.0)
MONO ABS: 0.4 10*3/uL (ref 0.1–1.0)
Monocytes Relative: 5 % (ref 3.0–12.0)
NEUTROS ABS: 5.5 10*3/uL (ref 1.4–7.7)
Neutrophils Relative %: 67.8 % (ref 43.0–77.0)
PLATELETS: 171 10*3/uL (ref 150.0–400.0)
RBC: 4.58 Mil/uL (ref 3.87–5.11)
RDW: 13.6 % (ref 11.5–15.5)
WBC: 8.1 10*3/uL (ref 4.0–10.5)

## 2014-06-07 LAB — BASIC METABOLIC PANEL
BUN: 17 mg/dL (ref 6–23)
CO2: 26 meq/L (ref 19–32)
Calcium: 9.7 mg/dL (ref 8.4–10.5)
Chloride: 106 mEq/L (ref 96–112)
Creatinine, Ser: 0.68 mg/dL (ref 0.40–1.20)
GFR: 95.7 mL/min (ref >60.00)
Glucose, Bld: 99 mg/dL (ref 70–99)
POTASSIUM: 3.9 meq/L (ref 3.5–5.1)
SODIUM: 139 meq/L (ref 135–145)

## 2014-06-07 LAB — LIPID PANEL
CHOLESTEROL: 161 mg/dL (ref 0–200)
HDL: 43.8 mg/dL (ref >39.00)
LDL Cholesterol: 97 mg/dL (ref 0–99)
NonHDL: 117.2
Total CHOL/HDL Ratio: 4
Triglycerides: 101 mg/dL (ref 0.0–149.0)
VLDL: 20.2 mg/dL (ref 0.0–40.0)

## 2014-06-07 LAB — HEPATIC FUNCTION PANEL
ALBUMIN: 4.1 g/dL (ref 3.5–5.2)
ALK PHOS: 72 U/L (ref 39–117)
ALT: 16 U/L (ref 0–35)
AST: 15 U/L (ref 0–37)
Bilirubin, Direct: 0.1 mg/dL (ref 0.0–0.3)
Total Bilirubin: 0.2 mg/dL (ref 0.2–1.2)
Total Protein: 7.3 g/dL (ref 6.0–8.3)

## 2014-06-07 LAB — TSH: TSH: 1.15 u[IU]/mL (ref 0.35–4.50)

## 2014-06-07 NOTE — Patient Instructions (Addendum)
[facility] call [facility] to schedule your yearly screening mammogram. Address: [address]  [place], [place] [address]  [phone]  Hours:   Monday 7AM-6:30PM  Tuesday 7AM-5PM  Wednesday 7AM-5PM  Thursday 7AM-5PM  Friday 7AM-5PM  Saturday Closed  Sunday Closed   Preventive Care for Adults A healthy lifestyle and preventive care can promote health and wellness. Preventive health guidelines for women include the following key practices.  A routine yearly physical is a good way to check with your health care provider about your health and preventive screening. It is a chance to share any concerns and updates on your health and to receive a thorough exam.  Visit your dentist for a routine exam and preventive care every 6 months. Brush your teeth twice a day and floss once a day. Good oral hygiene prevents tooth decay and gum disease.  The frequency of eye exams is based on your age, health, family medical history, use of contact lenses, and other factors. Follow your health care provider's recommendations for frequency of eye exams.  Eat a healthy diet. Foods like vegetables, fruits, whole grains, low-fat dairy products, and lean protein foods contain the nutrients you need without too many calories. Decrease your intake of foods high in solid fats, added sugars, and salt. Eat the right amount of calories for you.Get information about a proper diet from your health care provider, if necessary.  Regular physical exercise is one of the most important things you can do for your health. Most adults should get at least 150 minutes of moderate-intensity exercise (any activity that increases your heart rate and causes you to sweat) each week. In addition, most adults need muscle-strengthening exercises on 2 or more days a week.  Maintain a healthy weight. The body mass index (BMI) is a screening tool to identify possible weight problems. It provides an estimate of body fat based  on height and weight. Your health care provider can find your BMI and can help you achieve or maintain a healthy weight.For adults 20 years and older:  A BMI below 18.5 is considered underweight.  A BMI of 18.5 to 24.9 is normal.  A BMI of 25 to 29.9 is considered overweight.  A BMI of 30 and above is considered obese.  Maintain normal blood lipids and cholesterol levels by exercising and minimizing your intake of saturated fat. Eat a balanced diet with plenty of fruit and vegetables. Blood tests for lipids and cholesterol should begin at age 13 and be repeated every 5 years. If your lipid or cholesterol levels are high, you are over 50, or you are at high risk for heart disease, you may need your cholesterol levels checked more frequently.Ongoing high lipid and cholesterol levels should be treated with medicines if diet and exercise are not working.  If you smoke, find out from your health care provider how to quit. If you do not use tobacco, do not start.  Lung cancer screening is recommended for adults aged 30-80 years who are at high risk for developing lung cancer because of a history of smoking. A yearly low-dose CT scan of the lungs is recommended for people who have at least a 30-pack-year history of smoking and are a current smoker or have quit within the past 15 years. A pack year of smoking is smoking an average of 1 pack of cigarettes a day for 1 year (for example: 1 pack a day for 30 years or 2 packs a day for 15  years). Yearly screening should continue until the smoker has stopped smoking for at least 15 years. Yearly screening should be stopped for people who develop a health problem that would prevent them from having lung cancer treatment.  If you are pregnant, do not drink alcohol. If you are breastfeeding, be very cautious about drinking alcohol. If you are not pregnant and choose to drink alcohol, do not have more than 1 drink per day. One drink is considered to be 12 ounces  (355 mL) of beer, 5 ounces (148 mL) of wine, or 1.5 ounces (44 mL) of liquor.  Avoid use of street drugs. Do not share needles with anyone. Ask for help if you need support or instructions about stopping the use of drugs.  High blood pressure causes heart disease and increases the risk of stroke. Your blood pressure should be checked at least every 1 to 2 years. Ongoing high blood pressure should be treated with medicines if weight loss and exercise do not work.  If you are 12-57 years old, ask your health care provider if you should take aspirin to prevent strokes.  Diabetes screening involves taking a blood sample to check your fasting blood sugar level. This should be done once every 3 years, after age 1, if you are within normal weight and without risk factors for diabetes. Testing should be considered at a younger age or be carried out more frequently if you are overweight and have at least 1 risk factor for diabetes.  Breast cancer screening is essential preventive care for women. You should practice "breast self-awareness." This means understanding the normal appearance and feel of your breasts and may include breast self-examination. Any changes detected, no matter how small, should be reported to a health care provider. Women in their 37s and 30s should have a clinical breast exam (CBE) by a health care provider as part of a regular health exam every 1 to 3 years. After age 50, women should have a CBE every year. Starting at age 69, women should consider having a mammogram (breast X-ray test) every year. Women who have a family history of breast cancer should talk to their health care provider about genetic screening. Women at a high risk of breast cancer should talk to their health care providers about having an MRI and a mammogram every year.  Breast cancer gene (BRCA)-related cancer risk assessment is recommended for women who have family members with BRCA-related cancers. BRCA-related  cancers include breast, ovarian, tubal, and peritoneal cancers. Having family members with these cancers may be associated with an increased risk for harmful changes (mutations) in the breast cancer genes BRCA1 and BRCA2. Results of the assessment will determine the need for genetic counseling and BRCA1 and BRCA2 testing.  Routine pelvic exams to screen for cancer are no longer recommended for nonpregnant women who are considered low risk for cancer of the pelvic organs (ovaries, uterus, and vagina) and who do not have symptoms. Ask your health care provider if a screening pelvic exam is right for you.  If you have had past treatment for cervical cancer or a condition that could lead to cancer, you need Pap tests and screening for cancer for at least 20 years after your treatment. If Pap tests have been discontinued, your risk factors (such as having a new sexual partner) need to be reassessed to determine if screening should be resumed. Some women have medical problems that increase the chance of getting cervical cancer. In these cases, your health care  provider may recommend more frequent screening and Pap tests.  The HPV test is an additional test that may be used for cervical cancer screening. The HPV test looks for the virus that can cause the cell changes on the cervix. The cells collected during the Pap test can be tested for HPV. The HPV test could be used to screen women aged 3 years and older, and should be used in women of any age who have unclear Pap test results. After the age of 110, women should have HPV testing at the same frequency as a Pap test.  Colorectal cancer can be detected and often prevented. Most routine colorectal cancer screening begins at the age of 74 years and continues through age 70 years. However, your health care provider may recommend screening at an earlier age if you have risk factors for colon cancer. On a yearly basis, your health care provider may provide home test  kits to check for hidden blood in the stool. Use of a small camera at the end of a tube, to directly examine the colon (sigmoidoscopy or colonoscopy), can detect the earliest forms of colorectal cancer. Talk to your health care provider about this at age 18, when routine screening begins. Direct exam of the colon should be repeated every 5-10 years through age 37 years, unless early forms of pre-cancerous polyps or small growths are found.  People who are at an increased risk for hepatitis B should be screened for this virus. You are considered at high risk for hepatitis B if:  You were born in a country where hepatitis B occurs often. Talk with your health care provider about which countries are considered high risk.  Your parents were born in a high-risk country and you have not received a shot to protect against hepatitis B (hepatitis B vaccine).  You have HIV or AIDS.  You use needles to inject street drugs.  You live with, or have sex with, someone who has hepatitis B.  You get hemodialysis treatment.  You take certain medicines for conditions like cancer, organ transplantation, and autoimmune conditions.  Hepatitis C blood testing is recommended for all people born from [birth year] through 1965 and any individual with known risks for hepatitis C.  Practice safe sex. Use condoms and avoid high-risk sexual practices to reduce the spread of sexually transmitted infections (STIs). STIs include gonorrhea, chlamydia, syphilis, trichomonas, herpes, HPV, and human immunodeficiency virus (HIV). Herpes, HIV, and HPV are viral illnesses that have no cure. They can result in disability, cancer, and death.  You should be screened for sexually transmitted illnesses (STIs) including gonorrhea and chlamydia if:  You are sexually active and are younger than 24 years.  You are older than 24 years and your health care provider tells you that you are at risk for this type of infection.  Your sexual activity  has changed since you were last screened and you are at an increased risk for chlamydia or gonorrhea. Ask your health care provider if you are at risk.  If you are at risk of being infected with HIV, it is recommended that you take a prescription medicine daily to prevent HIV infection. This is called preexposure prophylaxis (PrEP). You are considered at risk if:  You are a heterosexual woman, are sexually active, and are at increased risk for HIV infection.  You take drugs by injection.  You are sexually active with a partner who has HIV.  Talk with your health care provider about whether you are  at high risk of being infected with HIV. If you choose to begin PrEP, you should first be tested for HIV. You should then be tested every 3 months for as long as you are taking PrEP.  Osteoporosis is a disease in which the bones lose minerals and strength with aging. This can result in serious bone fractures or breaks. The risk of osteoporosis can be identified using a bone density scan. Women ages 55 years and over and women at risk for fractures or osteoporosis should discuss screening with their health care providers. Ask your health care provider whether you should take a calcium supplement or vitamin D to reduce the rate of osteoporosis.  Menopause can be associated with physical symptoms and risks. Hormone replacement therapy is available to decrease symptoms and risks. You should talk to your health care provider about whether hormone replacement therapy is right for you.  Use sunscreen. Apply sunscreen liberally and repeatedly throughout the day. You should seek shade when your shadow is shorter than you. Protect yourself by wearing long sleeves, pants, a wide-brimmed hat, and sunglasses year round, whenever you are outdoors.  Once a month, do a whole body skin exam, using a mirror to look at the skin on your back. Tell your health care provider of new moles, moles that have irregular borders,  moles that are larger than a pencil eraser, or moles that have changed in shape or color.  Stay current with required vaccines (immunizations).  Influenza vaccine. All adults should be immunized every year.  Tetanus, diphtheria, and acellular pertussis (Td, Tdap) vaccine. Pregnant women should receive 1 dose of Tdap vaccine during each pregnancy. The dose should be obtained regardless of the length of time since the last dose. Immunization is preferred during the 27th-36th week of gestation. An adult who has not previously received Tdap or who does not know her vaccine status should receive 1 dose of Tdap. This initial dose should be followed by tetanus and diphtheria toxoids (Td) booster doses every 10 years. Adults with an unknown or incomplete history of completing a 3-dose immunization series with Td-containing vaccines should begin or complete a primary immunization series including a Tdap dose. Adults should receive a Td booster every 10 years.  Varicella vaccine. An adult without evidence of immunity to varicella should receive 2 doses or a second dose if she has previously received 1 dose. Pregnant females who do not have evidence of immunity should receive the first dose after pregnancy. This first dose should be obtained before leaving the health care facility. The second dose should be obtained 4-8 weeks after the first dose.  Human papillomavirus (HPV) vaccine. Females aged 13-26 years who have not received the vaccine previously should obtain the 3-dose series. The vaccine is not recommended for use in pregnant females. However, pregnancy testing is not needed before receiving a dose. If a female is found to be pregnant after receiving a dose, no treatment is needed. In that case, the remaining doses should be delayed until after the pregnancy. Immunization is recommended for any person with an immunocompromised condition through the age of 31 years if she did not get any or all doses earlier.  During the 3-dose series, the second dose should be obtained 4-8 weeks after the first dose. The third dose should be obtained 24 weeks after the first dose and 16 weeks after the second dose.  Zoster vaccine. One dose is recommended for adults aged 62 years or older unless certain conditions are present.  Measles, mumps, and rubella (MMR) vaccine. Adults born before [birth year] generally are considered immune to measles and mumps. Adults born in [birth year] or later should have 1 or more doses of MMR vaccine unless there is a contraindication to the vaccine or there is laboratory evidence of immunity to each of the three diseases. A routine second dose of MMR vaccine should be obtained at least 28 days after the first dose for students attending postsecondary schools, health care workers, or international travelers. People who received inactivated measles vaccine or an unknown type of measles vaccine during 1963-1967 should receive 2 doses of MMR vaccine. People who received inactivated mumps vaccine or an unknown type of mumps vaccine before 1979 and are at high risk for mumps infection should consider immunization with 2 doses of MMR vaccine. For females of childbearing age, rubella immunity should be determined. If there is no evidence of immunity, females who are not pregnant should be vaccinated. If there is no evidence of immunity, females who are pregnant should delay immunization until after pregnancy. Unvaccinated health care workers born before [birth year] who lack laboratory evidence of measles, mumps, or rubella immunity or laboratory confirmation of disease should consider measles and mumps immunization with 2 doses of MMR vaccine or rubella immunization with 1 dose of MMR vaccine.  Pneumococcal 13-valent conjugate (PCV13) vaccine. When indicated, a person who is uncertain of her immunization history and has no record of immunization should receive the PCV13 vaccine. An adult aged 62 years or older who has certain  medical conditions and has not been previously immunized should receive 1 dose of PCV13 vaccine. This PCV13 should be followed with a dose of pneumococcal polysaccharide (PPSV23) vaccine. The PPSV23 vaccine dose should be obtained at least 8 weeks after the dose of PCV13 vaccine. An adult aged 100 years or older who has certain medical conditions and previously received 1 or more doses of PPSV23 vaccine should receive 1 dose of PCV13. The PCV13 vaccine dose should be obtained 1 or more years after the last PPSV23 vaccine dose.  Pneumococcal polysaccharide (PPSV23) vaccine. When PCV13 is also indicated, PCV13 should be obtained first. All adults aged 49 years and older should be immunized. An adult younger than age 80 years who has certain medical conditions should be immunized. Any person who resides in a nursing home or long-term care facility should be immunized. An adult smoker should be immunized. People with an immunocompromised condition and certain other conditions should receive both PCV13 and PPSV23 vaccines. People with human immunodeficiency virus (HIV) infection should be immunized as soon as possible after diagnosis. Immunization during chemotherapy or radiation therapy should be avoided. Routine use of PPSV23 vaccine is not recommended for American Indians, [place] Natives, or people younger than 65 years unless there are medical conditions that require PPSV23 vaccine. When indicated, people who have unknown immunization and have no record of immunization should receive PPSV23 vaccine. One-time revaccination 5 years after the first dose of PPSV23 is recommended for people aged 19-64 years who have chronic kidney failure, nephrotic syndrome, asplenia, or immunocompromised conditions. People who received 1-2 doses of PPSV23 before age 51 years should receive another dose of PPSV23 vaccine at age 36 years or later if at least 5 years have passed since the previous dose. Doses of PPSV23 are not needed for  people immunized with PPSV23 at or after age 10 years.  Meningococcal vaccine. Adults with asplenia or persistent complement component deficiencies should receive 2 doses of quadrivalent meningococcal conjugate (MenACWY-D) vaccine.  The doses should be obtained at least 2 months apart. Microbiologists working with certain meningococcal bacteria, [employer] recruits, people at risk during an outbreak, and people who travel to or live in countries with a high rate of meningitis should be immunized. A first-year college student up through age 3 years who is living in a residence hall should receive a dose if she did not receive a dose on or after her 16th birthday. Adults who have certain high-risk conditions should receive one or more doses of vaccine.  Hepatitis A vaccine. Adults who wish to be protected from this disease, have certain high-risk conditions, work with hepatitis A-infected animals, work in hepatitis A research labs, or travel to or work in countries with a high rate of hepatitis A should be immunized. Adults who were previously unvaccinated and who anticipate close contact with an international adoptee during the first 60 days after arrival in the [place] from a country with a high rate of hepatitis A should be immunized.  Hepatitis B vaccine. Adults who wish to be protected from this disease, have certain high-risk conditions, may be exposed to blood or other infectious body fluids, are household contacts or sex partners of hepatitis B positive people, are clients or workers in certain care facilities, or travel to or work in countries with a high rate of hepatitis B should be immunized.  Haemophilus influenzae type b (Hib) vaccine. A previously unvaccinated person with asplenia or sickle cell disease or having a scheduled splenectomy should receive 1 dose of Hib vaccine. Regardless of previous immunization, a recipient of a hematopoietic stem cell transplant should receive a 3-dose series  6-12 months after her successful transplant. Hib vaccine is not recommended for adults with HIV infection. Preventive Services / Frequency Ages 52 to 35 years  Blood pressure check.** / Every 1 to 2 years.  Lipid and cholesterol check.** / Every 5 years beginning at age 39.  Clinical breast exam.** / Every 3 years for women in their 65s and 58s.  BRCA-related cancer risk assessment.** / For women who have family members with a BRCA-related cancer (breast, ovarian, tubal, or peritoneal cancers).  Pap test.** / Every 2 years from ages 53 through 3. Every 3 years starting at age 10 through age 8 or 60 with a history of 3 consecutive normal Pap tests.  HPV screening.** / Every 3 years from ages 36 through ages 8 to 65 with a history of 3 consecutive normal Pap tests.  Hepatitis C blood test.** / For any individual with known risks for hepatitis C.  Skin self-exam. / Monthly.  Influenza vaccine. / Every year.  Tetanus, diphtheria, and acellular pertussis (Tdap, Td) vaccine.** / Consult your health care provider. Pregnant women should receive 1 dose of Tdap vaccine during each pregnancy. 1 dose of Td every 10 years.  Varicella vaccine.** / Consult your health care provider. Pregnant females who do not have evidence of immunity should receive the first dose after pregnancy.  HPV vaccine. / 3 doses over 6 months, if 67 and younger. The vaccine is not recommended for use in pregnant females. However, pregnancy testing is not needed before receiving a dose.  Measles, mumps, rubella (MMR) vaccine.** / You need at least 1 dose of MMR if you were born in 1957 or later. You may also need a 2nd dose. For females of childbearing age, rubella immunity should be determined. If there is no evidence of immunity, females who are not pregnant should be vaccinated. If there is  no evidence of immunity, females who are pregnant should delay immunization until after pregnancy.  Pneumococcal 13-valent  conjugate (PCV13) vaccine.** / Consult your health care provider.  Pneumococcal polysaccharide (PPSV23) vaccine.** / 1 to 2 doses if you smoke cigarettes or if you have certain conditions.  Meningococcal vaccine.** / 1 dose if you are age 57 to 41 years and a Market researcher living in a residence hall, or have one of several medical conditions, you need to get vaccinated against meningococcal disease. You may also need additional booster doses.  Hepatitis A vaccine.** / Consult your health care provider.  Hepatitis B vaccine.** / Consult your health care provider.  Haemophilus influenzae type b (Hib) vaccine.** / Consult your health care provider. Ages 58 to 5 years  Blood pressure check.** / Every 1 to 2 years.  Lipid and cholesterol check.** / Every 5 years beginning at age 12 years.  Lung cancer screening. / Every year if you are aged 48-80 years and have a 30-pack-year history of smoking and currently smoke or have quit within the past 15 years. Yearly screening is stopped once you have quit smoking for at least 15 years or develop a health problem that would prevent you from having lung cancer treatment.  Clinical breast exam.** / Every year after age 29 years.  BRCA-related cancer risk assessment.** / For women who have family members with a BRCA-related cancer (breast, ovarian, tubal, or peritoneal cancers).  Mammogram.** / Every year beginning at age 46 years and continuing for as long as you are in good health. Consult with your health care provider.  Pap test.** / Every 3 years starting at age 71 years through age 17 or 49 years with a history of 3 consecutive normal Pap tests.  HPV screening.** / Every 3 years from ages 63 years through ages 53 to 78 years with a history of 3 consecutive normal Pap tests.  Fecal occult blood test (FOBT) of stool. / Every year beginning at age 6 years and continuing until age 82 years. You may not need to do this test if you get a  colonoscopy every 10 years.  Flexible sigmoidoscopy or colonoscopy.** / Every 5 years for a flexible sigmoidoscopy or every 10 years for a colonoscopy beginning at age 51 years and continuing until age 39 years.  Hepatitis C blood test.** / For all people born from [birth year] through 1966 and any individual with known risks for hepatitis C.  Skin self-exam. / Monthly.  Influenza vaccine. / Every year.  Tetanus, diphtheria, and acellular pertussis (Tdap/Td) vaccine.** / Consult your health care provider. Pregnant women should receive 1 dose of Tdap vaccine during each pregnancy. 1 dose of Td every 10 years.  Varicella vaccine.** / Consult your health care provider. Pregnant females who do not have evidence of immunity should receive the first dose after pregnancy.  Zoster vaccine.** / 1 dose for adults aged 32 years or older.  Measles, mumps, rubella (MMR) vaccine.** / You need at least 1 dose of MMR if you were born in 1957 or later. You may also need a 2nd dose. For females of childbearing age, rubella immunity should be determined. If there is no evidence of immunity, females who are not pregnant should be vaccinated. If there is no evidence of immunity, females who are pregnant should delay immunization until after pregnancy.  Pneumococcal 13-valent conjugate (PCV13) vaccine.** / Consult your health care provider.  Pneumococcal polysaccharide (PPSV23) vaccine.** / 1 to 2 doses if you smoke cigarettes  or if you have certain conditions.  Meningococcal vaccine.** / Consult your health care provider.  Hepatitis A vaccine.** / Consult your health care provider.  Hepatitis B vaccine.** / Consult your health care provider.  Haemophilus influenzae type b (Hib) vaccine.** / Consult your health care provider. Ages 35 years and over  Blood pressure check.** / Every 1 to 2 years.  Lipid and cholesterol check.** / Every 5 years beginning at age 92 years.  Lung cancer screening. / Every year if you  are aged 55-80 years and have a 30-pack-year history of smoking and currently smoke or have quit within the past 15 years. Yearly screening is stopped once you have quit smoking for at least 15 years or develop a health problem that would prevent you from having lung cancer treatment.  Clinical breast exam.** / Every year after age 85 years.  BRCA-related cancer risk assessment.** / For women who have family members with a BRCA-related cancer (breast, ovarian, tubal, or peritoneal cancers).  Mammogram.** / Every year beginning at age 38 years and continuing for as long as you are in good health. Consult with your health care provider.  Pap test.** / Every 3 years starting at age 11 years through age 70 or 53 years with 3 consecutive normal Pap tests. Testing can be stopped between 65 and 70 years with 3 consecutive normal Pap tests and no abnormal Pap or HPV tests in the past 10 years.  HPV screening.** / Every 3 years from ages 28 years through ages 10 or 44 years with a history of 3 consecutive normal Pap tests. Testing can be stopped between 65 and 70 years with 3 consecutive normal Pap tests and no abnormal Pap or HPV tests in the past 10 years.  Fecal occult blood test (FOBT) of stool. / Every year beginning at age 72 years and continuing until age 28 years. You may not need to do this test if you get a colonoscopy every 10 years.  Flexible sigmoidoscopy or colonoscopy.** / Every 5 years for a flexible sigmoidoscopy or every 10 years for a colonoscopy beginning at age 56 years and continuing until age 52 years.  Hepatitis C blood test.** / For all people born from [birth year] through 1966 and any individual with known risks for hepatitis C.  Osteoporosis screening.** / A one-time screening for women ages 63 years and over and women at risk for fractures or osteoporosis.  Skin self-exam. / Monthly.  Influenza vaccine. / Every year.  Tetanus, diphtheria, and acellular pertussis (Tdap/Td)  vaccine.** / 1 dose of Td every 10 years.  Varicella vaccine.** / Consult your health care provider.  Zoster vaccine.** / 1 dose for adults aged 52 years or older.  Pneumococcal 13-valent conjugate (PCV13) vaccine.** / Consult your health care provider.  Pneumococcal polysaccharide (PPSV23) vaccine.** / 1 dose for all adults aged 43 years and older.  Meningococcal vaccine.** / Consult your health care provider.  Hepatitis A vaccine.** / Consult your health care provider.  Hepatitis B vaccine.** / Consult your health care provider.  Haemophilus influenzae type b (Hib) vaccine.** / Consult your health care provider. ** Family history and personal history of risk and conditions may change your health care provider's recommendations. Document Released: 03/06/2001 Document Revised: 05/25/2013 Document Reviewed: 06/05/2010 [facility] Patient Information 2015 [place], [place]. This information is not intended to replace advice given to you by your health care provider. Make sure you discuss any questions you have with your health care provider.

## 2014-06-07 NOTE — Progress Notes (Signed)
Pre visit review using our clinic review tool, if applicable. No additional management support is needed unless otherwise documented below in the visit note. 

## 2014-06-07 NOTE — Progress Notes (Signed)
000              Subjective:     Kristin Hubbard is a 54 y.o. female and is here for a comprehensive physical exam. The patient reports no problems.  History   Social History  . Marital Status: Single    Spouse Name: N/A  . Number of Children: N/A  . Years of Education: N/A   Occupational History  . RECEPTionist    Social History Main Topics  . Smoking status: Current Every Day Smoker -- 1.00 packs/day for 17 years    Types: Cigarettes  . Smokeless tobacco: Never Used     Comment: she said when she loses 100 lbs she will quit smoking  . Alcohol Use: No  . Drug Use: No  . Sexual Activity:    Partners: Male   Other Topics Concern  . Not on file   Social History Narrative   Exercise--  Trainer and gym   Health Maintenance  Topic Date Due  . MAMMOGRAM  06/25/2010  . HIV Screening  06/07/2015 (Originally 01/20/1976)  . INFLUENZA VACCINE  08/23/2014  . PAP SMEAR  04/15/2015  . COLONOSCOPY  04/15/2022  . TETANUS/TDAP  04/15/2022    The following portions of the patient's history were reviewed and updated as appropriate:  She  has a past medical history of Hypertension; Thyroid disease; Migraine; and Anxiety. She  does not have any pertinent problems on file. She  has past surgical history that includes Cholecystectomy; Knee surgery (05/2007); and Bunionectomy. Her family history includes Alcohol abuse in her father and mother; Diabetes in her father and another family member. She  reports that she has been smoking Cigarettes.  She has a 17 pack-year smoking history. She has never used smokeless tobacco. She reports that she does not drink alcohol or use illicit drugs. She has a current medication list which includes the following prescription(s): amlodipine, levothyroxine, nystatin cream, oxycodone-acetaminophen, and simvastatin. Current Outpatient Prescriptions on File Prior to Visit  Medication Sig Dispense Refill  . amLODipine (NORVASC) 5 MG tablet TAKE ONE  TABLET BY MOUTH ONCE DAILY 30 tablet 0  . levothyroxine (SYNTHROID, LEVOTHROID) 150 MCG tablet TAKE ONE TABLET BY MOUTH ONCE DAILY 30 tablet 1  . nystatin cream (MYCOSTATIN) Apply 1 application topically 2 (two) times daily. 30 g 0  . oxyCODONE-acetaminophen (PERCOCET) 10-325 MG per tablet Take 1 tablet by mouth every 8 (eight) hours as needed for pain. 20 tablet 0  . simvastatin (ZOCOR) 40 MG tablet TAKE ONE TABLET BY MOUTH AT BEDTIME 30 tablet 0   No current facility-administered medications on file prior to visit.   She is allergic to lisinopril..  Review of Systems Review of Systems  Constitutional: Negative for activity change, appetite change and fatigue.  HENT: Negative for hearing loss, congestion, tinnitus and ear discharge.  dentist [id number] Eyes: Negative for visual disturbance (see optho q1y -- vision corrected to 20/20 with glasses).  Respiratory: Negative for cough, chest tightness and shortness of breath.   Cardiovascular: Negative for chest pain, palpitations and leg swelling.  Gastrointestinal: Negative for abdominal pain, diarrhea, constipation and abdominal distention.  Genitourinary: Negative for urgency, frequency, decreased urine volume and difficulty urinating.  Musculoskeletal: Negative for back pain, arthralgias and gait problem.  Skin: Negative for color change, pallor and rash.  Neurological: Negative for dizziness, light-headedness, numbness and headaches.  Hematological: Negative for adenopathy. Does not bruise/bleed easily.  Psychiatric/Behavioral: Negative for suicidal ideas, confusion, sleep disturbance, self-injury, dysphoric mood, decreased concentration  and agitation.       Objective:    BP 130/78 mmHg  Pulse 87  Temp(Src) 97.9 F (36.6 C) (Oral)  Ht 5\' 8"  (1.727 m)  Wt 272 lb (123.378 kg)  BMI 41.37 kg/m2  SpO2 98% General appearance: alert, cooperative, appears stated age and no distress Head: Normocephalic, without obvious abnormality,  atraumatic Eyes: conjunctivae/corneas clear. PERRL, EOM's intact. Fundi benign. Ears: normal TM's and external ear canals both ears Nose: Nares normal. Septum midline. Mucosa normal. No drainage or sinus tenderness. Throat: lips, mucosa, and tongue normal; teeth and gums normal Neck: no adenopathy, no carotid bruit, no JVD, supple, symmetrical, trachea midline and thyroid not enlarged, symmetric, no tenderness/mass/nodules Back: symmetric, no curvature. ROM normal. No CVA tenderness. Lungs: clear to auscultation bilaterally Breasts: normal appearance, no masses or tenderness Heart: regular rate and rhythm, S1, S2 normal, no murmur, click, rub or gallop Abdomen: soft, non-tender; bowel sounds normal; no masses,  no organomegaly Pelvic: cervix normal in appearance, external genitalia normal, no adnexal masses or tenderness, no cervical motion tenderness, rectovaginal septum normal, uterus normal size, shape, and consistency, vagina normal without discharge and pap done blind due to pt unable to tolerate speculum--- rectal heme neg brown stool Extremities: extremities normal, atraumatic, no cyanosis or edema Pulses: 2+ and symmetric Skin: Skin color, texture, turgor normal. No rashes or lesions Lymph nodes: Cervical, supraclavicular, and axillary nodes normal. Neurologic: Alert and oriented X 3, normal strength and tone. Normal symmetric reflexes. Normal coordination and gait Psych-- no depression, no anxiety      Assessment:    Healthy female exam.      Plan:    ghm utd Check labs See After Visit Summary for Counseling Recommendations    1. Preventative health care   - Basic metabolic panel - CBC with Differential/Platelet - Hepatic function panel - Lipid panel - TSH - POCT Urinalysis Dipstick - Cytology - PAP  2. Hypothyroidism, unspecified hypothyroidism type con't synthroid Check labs  - TSH  3. Essential hypertension Stable   - Basic metabolic panel - CBC with  Differential/Platelet  4. Hyperlipidemia   - Basic metabolic panel - CBC with Differential/Platelet - Hepatic function panel - Lipid panel  5. Obesity (BMI 30-39.9) Discussed exercise and diet con't with trainer

## 2014-06-09 ENCOUNTER — Telehealth: Payer: Self-pay

## 2014-06-09 ENCOUNTER — Encounter: Payer: Self-pay | Admitting: Family Medicine

## 2014-06-09 LAB — CYTOLOGY - PAP

## 2014-06-09 NOTE — Telephone Encounter (Signed)
-----   Message from Lelon PerlaYvonne R Lowne, DO sent at 06/08/2014 10:36 PM EDT ----- Labs-- great!  Recheck 6 months

## 2014-06-09 NOTE — Telephone Encounter (Signed)
Letter mailed and labs [facility].     KP

## 2014-06-23 ENCOUNTER — Encounter: Payer: Self-pay | Admitting: Family Medicine

## 2014-07-13 ENCOUNTER — Telehealth: Payer: Self-pay | Admitting: *Deleted

## 2014-07-13 NOTE — Telephone Encounter (Signed)
Called and [facility] @ 2:31pm @ [phone]) asking the pt to RTC regarding updating chart for mammogram.//AB/CMA

## 2014-08-06 NOTE — Telephone Encounter (Signed)
Called and spoke with the pt regarding update on her Mammogram.   She stated that it has been awhile,but she will call [facility] next week to schedule an appt.//AB/CMA

## 2014-08-12 ENCOUNTER — Encounter: Payer: Self-pay | Admitting: Family Medicine

## 2014-09-08 ENCOUNTER — Encounter: Payer: Self-pay | Admitting: Family Medicine

## 2014-09-08 MED ORDER — LEVOTHYROXINE SODIUM 150 MCG PO TABS: ORAL_TABLET | ORAL | Status: DC

## 2014-09-08 MED ORDER — SIMVASTATIN 40 MG PO TABS: 40.0000 mg | ORAL_TABLET | Freq: Every day | ORAL | Status: DC

## 2014-09-08 MED ORDER — AMLODIPINE BESYLATE 5 MG PO TABS: 5.0000 mg | ORAL_TABLET | Freq: Every day | ORAL | Status: DC

## 2014-10-19 ENCOUNTER — Encounter: Payer: Self-pay | Admitting: Family Medicine

## 2015-02-02 ENCOUNTER — Encounter: Payer: Self-pay | Admitting: Family Medicine

## 2015-02-03 ENCOUNTER — Other Ambulatory Visit: Payer: Self-pay | Admitting: Family Medicine

## 2015-02-03 DIAGNOSIS — E669 Obesity, unspecified: Secondary | ICD-10-CM

## 2015-02-03 MED ORDER — NALTREXONE-BUPROPION HCL ER 8-90 MG PO TB12: ORAL_TABLET | ORAL | Status: DC

## 2015-02-16 ENCOUNTER — Encounter: Payer: Self-pay | Admitting: Family Medicine

## 2015-03-15 ENCOUNTER — Encounter: Payer: Self-pay | Admitting: Family Medicine

## 2015-03-15 NOTE — Telephone Encounter (Signed)
Please advise      KP 

## 2015-03-15 NOTE — Telephone Encounter (Signed)
i prefer not to use phenteramine because of bp meds contrave-- they say to give 4 months before saying it does not work We can discuss other options at [facility]

## 2015-04-04 ENCOUNTER — Encounter: Payer: Self-pay | Admitting: Family Medicine

## 2015-04-07 ENCOUNTER — Encounter: Payer: Self-pay | Admitting: Family Medicine

## 2015-04-14 ENCOUNTER — Other Ambulatory Visit: Payer: Self-pay | Admitting: Family Medicine

## 2015-05-12 ENCOUNTER — Encounter: Payer: Self-pay | Admitting: Family Medicine

## 2015-05-12 ENCOUNTER — Ambulatory Visit (INDEPENDENT_AMBULATORY_CARE_PROVIDER_SITE_OTHER): Payer: PRIVATE HEALTH INSURANCE | Admitting: Family Medicine

## 2015-05-12 VITALS — BP 124/78 | HR 89 | Temp 98.2°F | Ht 68.0 in | Wt 288.5 lb

## 2015-05-12 DIAGNOSIS — I10 Essential (primary) hypertension: Secondary | ICD-10-CM | POA: Diagnosis not present

## 2015-05-12 DIAGNOSIS — E039 Hypothyroidism, unspecified: Secondary | ICD-10-CM

## 2015-05-12 DIAGNOSIS — E785 Hyperlipidemia, unspecified: Secondary | ICD-10-CM

## 2015-05-12 MED ORDER — SIMVASTATIN 40 MG PO TABS: 40.0000 mg | ORAL_TABLET | Freq: Every day | ORAL | Status: DC

## 2015-05-12 MED ORDER — AMLODIPINE BESYLATE 5 MG PO TABS: 5.0000 mg | ORAL_TABLET | Freq: Every day | ORAL | Status: DC

## 2015-05-12 MED ORDER — LEVOTHYROXINE SODIUM 150 MCG PO TABS: 150.0000 ug | ORAL_TABLET | Freq: Every day | ORAL | Status: DC

## 2015-05-12 NOTE — Progress Notes (Signed)
Patient ID: Hubert AzureRobin Johndrow, female    DOB: March 24, 1960  Age: 55 y.o. MRN: [id number]    Subjective:  Subjective HPI Hubert AzureRobin Jerome presents for f/u and have foster care form filled out.  Pt is still working out at gym and is frustrated with weight gain.    Review of Systems  Constitutional: Negative for diaphoresis, appetite change, fatigue and unexpected weight change.  Eyes: Negative for pain, redness and visual disturbance.  Respiratory: Negative for cough, chest tightness, shortness of breath and wheezing.   Cardiovascular: Negative for chest pain, palpitations and leg swelling.  Endocrine: Negative for cold intolerance, heat intolerance, polydipsia, polyphagia and polyuria.  Genitourinary: Negative for dysuria, frequency and difficulty urinating.  Neurological: Negative for dizziness, light-headedness, numbness and headaches.    History Past Medical History  Diagnosis Date  . Hypertension   . Thyroid disease     Hypothyroidism  . Migraine   . Anxiety     She has past surgical history that includes Cholecystectomy; Knee surgery (05/2007); and Bunionectomy.   Her family history includes Alcohol abuse in her father and mother; Diabetes in her father.She reports that she has been smoking Cigarettes.  She has a 17 pack-year smoking history. She has never used smokeless tobacco. She reports that she does not drink alcohol or use illicit drugs.  Current Outpatient Prescriptions on File Prior to Visit  Medication Sig Dispense Refill  . nystatin cream (MYCOSTATIN) Apply 1 application topically 2 (two) times daily. 30 g 0  . Naltrexone-Bupropion HCl ER (CONTRAVE) 8-90 MG TB12 2 po bid (Patient not taking: Reported on 05/12/2015) 120 tablet 5  . oxyCODONE-acetaminophen (PERCOCET) 10-325 MG per tablet Take 1 tablet by mouth every 8 (eight) hours as needed for pain. (Patient not taking: Reported on 05/12/2015) 20 tablet 0   No current facility-administered medications on file prior to visit.      Objective:  Objective Physical Exam  Constitutional: She is oriented to person, place, and time. She appears well-developed and well-nourished.  HENT:  Head: Normocephalic and atraumatic.  Eyes: Conjunctivae and EOM are normal.  Neck: Normal range of motion. Neck supple. No JVD present. Carotid bruit is not present. No thyromegaly present.  Cardiovascular: Normal rate, regular rhythm and normal heart sounds.   No murmur heard. Pulmonary/Chest: Effort normal and breath sounds normal. No respiratory distress. She has no wheezes. She has no rales. She exhibits no tenderness.  Musculoskeletal: She exhibits no edema.  Neurological: She is alert and oriented to person, place, and time.  Psychiatric: She has a normal mood and affect. Her behavior is normal.   BP 124/78 mmHg  Pulse 89  Temp(Src) 98.2 F (36.8 C) (Oral)  Ht 5\' 8"  (1.727 m)  Wt 288 lb 8 oz (130.863 kg)  BMI 43.88 kg/m2  SpO2 94% Wt Readings from Last 3 Encounters:  05/12/15 288 lb 8 oz (130.863 kg)  06/07/14 272 lb (123.378 kg)  09/14/13 250 lb (113.399 kg)     Lab Results  Component Value Date   WBC 8.1 06/07/2014   HGB 14.6 06/07/2014   HCT 42.4 06/07/2014   PLT 171.0 06/07/2014   GLUCOSE 99 06/07/2014   CHOL 161 06/07/2014   TRIG 101.0 06/07/2014   HDL 43.80 06/07/2014   LDLDIRECT 173.6 08/22/2011   LDLCALC 97 06/07/2014   ALT 16 06/07/2014   AST 15 06/07/2014   NA 139 06/07/2014   K 3.9 06/07/2014   CL 106 06/07/2014   CREATININE 0.68 06/07/2014   BUN 17  06/07/2014   CO2 26 06/07/2014   TSH 1.15 06/07/2014   HGBA1C 5.6 01/20/2013    No results found.   Assessment & Plan:  Plan I have changed Ms. Fergeson levothyroxine. I am also having her maintain her nystatin cream, oxyCODONE-acetaminophen, Naltrexone-Bupropion HCl ER, simvastatin, and amLODipine.  Meds ordered this encounter  Medications  . simvastatin (ZOCOR) 40 MG tablet    Sig: Take 1 tablet (40 mg total) by mouth at bedtime.     Dispense:  30 tablet    Refill:  5  . levothyroxine (SYNTHROID, LEVOTHROID) 150 MCG tablet    Sig: Take 1 tablet (150 mcg total) by mouth daily.    Dispense:  30 tablet    Refill:  0  . amLODipine (NORVASC) 5 MG tablet    Sig: Take 1 tablet (5 mg total) by mouth daily. Office visit due now    Dispense:  30 tablet    Refill:  0    Problem List Items Addressed This Visit      Unprioritized   Essential hypertension - Primary    Stable con't meds      Relevant Medications   simvastatin (ZOCOR) 40 MG tablet   amLODipine (NORVASC) 5 MG tablet   Other Relevant Orders   Comprehensive metabolic panel   Hyperlipidemia   Relevant Medications   simvastatin (ZOCOR) 40 MG tablet   amLODipine (NORVASC) 5 MG tablet   Other Relevant Orders   Lipid panel    Other Visit Diagnoses    Hypothyroidism, unspecified hypothyroidism type        Relevant Medications    levothyroxine (SYNTHROID, LEVOTHROID) 150 MCG tablet    Other Relevant Orders    TSH       Follow-up: Return if symptoms worsen or fail to improve, for annual exam, fasting.  Donato Schultz, DO

## 2015-05-12 NOTE — Progress Notes (Signed)
Pre visit review using our clinic review tool, if applicable. No additional management support is needed unless otherwise documented below in the visit note. 

## 2015-05-12 NOTE — Assessment & Plan Note (Signed)
Stable con't meds 

## 2015-05-12 NOTE — Patient Instructions (Signed)
Hypertension Hypertension, commonly called high blood pressure, is when the force of blood pumping through your arteries is too strong. Your arteries are the blood vessels that carry blood from your heart throughout your body. A blood pressure reading consists of a higher number over a lower number, such as 110/72. The higher number (systolic) is the pressure inside your arteries when your heart pumps. The lower number (diastolic) is the pressure inside your arteries when your heart relaxes. Ideally you want your blood pressure below 120/80. Hypertension forces your heart to work harder to pump blood. Your arteries may become narrow or stiff. Having untreated or uncontrolled hypertension can cause heart attack, stroke, kidney disease, and other problems. RISK FACTORS Some risk factors for high blood pressure are controllable. Others are not.  Risk factors you cannot control include:   Race. You may be at higher risk if you are African American.  Age. Risk increases with age.  Gender. Men are at higher risk than women before age 45 years. After age 65, women are at higher risk than men. Risk factors you can control include:  Not getting enough exercise or physical activity.  Being overweight.  Getting too much fat, sugar, calories, or salt in your diet.  Drinking too much alcohol. SIGNS AND SYMPTOMS Hypertension does not usually cause signs or symptoms. Extremely high blood pressure (hypertensive crisis) may cause headache, anxiety, shortness of breath, and nosebleed. DIAGNOSIS To check if you have hypertension, your health care provider will measure your blood pressure while you are seated, with your arm held at the level of your heart. It should be measured at least twice using the same arm. Certain conditions can cause a difference in blood pressure between your right and left arms. A blood pressure reading that is higher than normal on one occasion does not mean that you need treatment. If  it is not clear whether you have high blood pressure, you may be asked to return on a different day to have your blood pressure checked again. Or, you may be asked to monitor your blood pressure at home for 1 or more weeks. TREATMENT Treating high blood pressure includes making lifestyle changes and possibly taking medicine. Living a healthy lifestyle can help lower high blood pressure. You may need to change some of your habits. Lifestyle changes may include:  Following the DASH diet. This diet is high in fruits, vegetables, and whole grains. It is low in salt, red meat, and added sugars.  Keep your sodium intake below 2,300 mg per day.  Getting at least 30-45 minutes of aerobic exercise at least 4 times per week.  Losing weight if necessary.  Not smoking.  Limiting alcoholic beverages.  Learning ways to reduce stress. Your health care provider may prescribe medicine if lifestyle changes are not enough to get your blood pressure under control, and if one of the following is true:  You are 18-59 years of age and your systolic blood pressure is above 140.  You are 60 years of age or older, and your systolic blood pressure is above 150.  Your diastolic blood pressure is above 90.  You have diabetes, and your systolic blood pressure is over 140 or your diastolic blood pressure is over 90.  You have kidney disease and your blood pressure is above 140/90.  You have heart disease and your blood pressure is above 140/90. Your personal target blood pressure may vary depending on your medical conditions, your age, and other factors. HOME CARE INSTRUCTIONS    Have your blood pressure rechecked as directed by your health care provider.   Take medicines only as directed by your health care provider. Follow the directions carefully. Blood pressure medicines must be taken as prescribed. The medicine does not work as well when you skip doses. Skipping doses also puts you at risk for  problems.  Do not smoke.   Monitor your blood pressure at home as directed by your health care provider. SEEK MEDICAL CARE IF:   You think you are having a reaction to medicines taken.  You have recurrent headaches or feel dizzy.  You have swelling in your ankles.  You have trouble with your vision. SEEK IMMEDIATE MEDICAL CARE IF:  You develop a severe headache or confusion.  You have unusual weakness, numbness, or feel faint.  You have severe chest or abdominal pain.  You vomit repeatedly.  You have trouble breathing. MAKE SURE YOU:   Understand these instructions.  Will watch your condition.  Will get help right away if you are not doing well or get worse.   This information is not intended to replace advice given to you by your health care provider. Make sure you discuss any questions you have with your health care provider.   Document Released: 01/08/2005 Document Revised: 05/25/2014 Document Reviewed: 10/31/2012 Elsevier Interactive Patient Education 2016 [employer]  

## 2015-05-13 ENCOUNTER — Encounter: Payer: Self-pay | Admitting: Family Medicine

## 2015-05-13 ENCOUNTER — Other Ambulatory Visit: Payer: Self-pay | Admitting: Family Medicine

## 2015-05-13 MED ORDER — LORCASERIN HCL 10 MG PO TABS: ORAL_TABLET | ORAL | Status: DC

## 2015-05-13 NOTE — Telephone Encounter (Signed)
Try belviq 10 mg bid #60  3 refills

## 2015-06-15 ENCOUNTER — Other Ambulatory Visit: Payer: Self-pay | Admitting: Family Medicine

## 2015-06-15 NOTE — Telephone Encounter (Signed)
Medication filled to pharmacy as requested.   

## 2015-07-19 ENCOUNTER — Other Ambulatory Visit: Payer: Self-pay | Admitting: Family Medicine

## 2015-08-15 ENCOUNTER — Other Ambulatory Visit: Payer: Self-pay | Admitting: Family Medicine

## 2015-09-22 ENCOUNTER — Other Ambulatory Visit: Payer: Self-pay | Admitting: Family Medicine

## 2015-09-28 ENCOUNTER — Telehealth: Payer: Self-pay

## 2015-09-28 NOTE — Telephone Encounter (Signed)
Procedure: Right Total Knee Replacement Surgery Date: 11/22/15  Last OV: 05/12/15 Labs: 05/12/15 Last EKG: 05/05/09  Patient needs a surgical clearance appointment.    Form given to Arman BogusKim Payne, CMA for completion during visit.   Denise--please call patient and schedule her a 30 min office visit with Dr. Laury AxonLowne for surgical clearance.

## 2015-09-28 NOTE — Telephone Encounter (Signed)
Called patient to schedule appointment for Surgical Clearance and patient stated that she will call back to schedule when she checks her calendar. Patient states her surgery is not until January 2018

## 2015-10-11 ENCOUNTER — Ambulatory Visit: Payer: PRIVATE HEALTH INSURANCE | Admitting: Family Medicine

## 2015-10-13 ENCOUNTER — Telehealth: Payer: Self-pay | Admitting: Family Medicine

## 2015-10-13 ENCOUNTER — Encounter: Payer: Self-pay | Admitting: Family Medicine

## 2015-10-13 NOTE — Telephone Encounter (Signed)
Pt called in to schedule a surgical clearance appt with PCP. Pt says that the only day that she can get off is Tuesday 9/26. She says that she is scheduled for surgery in January. Spoke with CMA. Advised that pt would need to schedule for a sooner date due to surgery date. Pt got very upset and stated that she cant believe the provider is doing this to her and also that she is just going to have to find a new provider. Pt says that she will call our office back later to schedule.

## 2015-10-13 NOTE — Telephone Encounter (Signed)
Scheduled for 12/06/15 at 6 pm OK per Dr.Lowne.    KP

## 2015-10-26 ENCOUNTER — Other Ambulatory Visit: Payer: Self-pay | Admitting: Family Medicine

## 2015-11-11 ENCOUNTER — Inpatient Hospital Stay (HOSPITAL_COMMUNITY)
Admission: RE | Admit: 2015-11-11 | Discharge: 2015-11-11 | Disposition: A | Discharge status: Home / Self Care | Payer: PRIVATE HEALTH INSURANCE | Source: Ambulatory Visit

## 2015-11-29 ENCOUNTER — Other Ambulatory Visit: Payer: Self-pay | Admitting: Family Medicine

## 2015-11-29 NOTE — Telephone Encounter (Signed)
Patient needs follow up appt Thanks PC 

## 2015-12-06 ENCOUNTER — Ambulatory Visit (INDEPENDENT_AMBULATORY_CARE_PROVIDER_SITE_OTHER): Payer: PRIVATE HEALTH INSURANCE | Admitting: Family Medicine

## 2015-12-06 ENCOUNTER — Encounter: Payer: Self-pay | Admitting: Family Medicine

## 2015-12-06 VITALS — BP 150/78 | HR 94 | Temp 98.0°F | Resp 17 | Ht 67.5 in | Wt 313.2 lb

## 2015-12-06 DIAGNOSIS — R9431 Abnormal electrocardiogram [ECG] [EKG]: Secondary | ICD-10-CM

## 2015-12-06 DIAGNOSIS — Z01818 Encounter for other preprocedural examination: Secondary | ICD-10-CM | POA: Diagnosis not present

## 2015-12-06 DIAGNOSIS — I10 Essential (primary) hypertension: Secondary | ICD-10-CM

## 2015-12-06 MED ORDER — HYDROCHLOROTHIAZIDE 25 MG PO TABS: 25.0000 mg | ORAL_TABLET | Freq: Every day | ORAL | 11 refills | Status: DC

## 2015-12-06 NOTE — Progress Notes (Signed)
Subjective:    Kristin Hubbard is a 55 y.o. female who presents to the office today for a preoperative consultation at the request of surgeon Dr Dion SaucierLandau who plans on performing knee replacement on January 9. This consultation is requested for the specific conditions prompting preoperative evaluation (i.e. because of potential affect on operative risk): age, obesity, hyperlipidemia and htn. Planned anesthesia: general. The patient has the following known anesthesia issues: none. Patients bleeding risk: no recent abnormal bleeding. Patient does not have objections to receiving blood products if needed.  The following portions of the patient's history were reviewed and updated as appropriate: She  has a past medical history of Anxiety; Hypertension; Migraine; and Thyroid disease. She  does not have any pertinent problems on file. She  has a past surgical history that includes Cholecystectomy; Knee surgery (05/2007); and Bunionectomy. Her family history includes Alcohol abuse in her father and mother; Diabetes in her father. She  reports that she has been smoking Cigarettes.  She has a 17.00 pack-year smoking history. She has never used smokeless tobacco. She reports that she does not drink alcohol or use drugs. She has a current medication list which includes the following prescription(s): amlodipine, levothyroxine, nystatin cream, oxycodone-acetaminophen, simvastatin, and hydrochlorothiazide. Current Outpatient Prescriptions on File Prior to Visit  Medication Sig Dispense Refill  . amLODipine (NORVASC) 5 MG tablet Take 1 tablet (5 mg total) by mouth daily. 30 tablet 5  . levothyroxine (SYNTHROID, LEVOTHROID) 150 MCG tablet TAKE ONE TABLET BY MOUTH ONCE DAILY **PATIENT NEEDS APPOINTMENT** 30 tablet 0  . nystatin cream (MYCOSTATIN) Apply 1 application topically 2 (two) times daily. 30 g 0  . oxyCODONE-acetaminophen (PERCOCET) 10-325 MG per tablet Take 1 tablet by mouth every 8 (eight) hours as needed for  pain. 20 tablet 0  . simvastatin (ZOCOR) 40 MG tablet Take 1 tablet (40 mg total) by mouth at bedtime. 30 tablet 5   No current facility-administered medications on file prior to visit.    She is allergic to lisinopril..  Review of Systems Review of Systems  Constitutional: Negative for activity change, appetite change and fatigue.  HENT: Negative for hearing loss, congestion, tinnitus and ear discharge.  dentist [id number] Eyes: Negative for visual disturbance (see optho q1y -- vision corrected to 20/20 with glasses).  Respiratory: Negative for cough, chest tightness and shortness of breath.   Cardiovascular: Negative for chest pain, palpitations and leg swelling.  Gastrointestinal: Negative for abdominal pain, diarrhea, constipation and abdominal distention.  Genitourinary: Negative for urgency, frequency, decreased urine volume and difficulty urinating.  Musculoskeletal: Negative for back pain, arthralgias and gait problem.  Skin: Negative for color change, pallor and rash.  Neurological: Negative for dizziness, light-headedness, numbness and headaches.  Hematological: Negative for adenopathy. Does not bruise/bleed easily.  Psychiatric/Behavioral: Negative for suicidal ideas, confusion, sleep disturbance, self-injury, dysphoric mood, decreased concentration and agitation.        Objective:   BP (!) 150/78 (BP Location: Left Arm, Patient Position: Sitting, Cuff Size: Large)   Pulse 94   Temp 98 F (36.7 C) (Oral)   Resp 17   Ht 5' 7.5" (1.715 m)   Wt (!) 313 lb 3.2 oz (142.1 kg)   SpO2 97%   BMI 48.33 kg/m  General appearance: alert, cooperative, appears stated age and no distress Head: Normocephalic, without obvious abnormality, atraumatic Eyes: conjunctivae/corneas clear. PERRL, EOM's intact. Fundi benign. Ears: normal TM's and external ear canals both ears Nose: Nares normal. Septum midline. Mucosa normal. No drainage or sinus tenderness.  Throat: lips, mucosa, and tongue  normal; teeth and gums normal Neck: no adenopathy, no carotid bruit, no JVD, supple, symmetrical, trachea midline and thyroid not enlarged, symmetric, no tenderness/mass/nodules Back: symmetric, no curvature. ROM normal. No CVA tenderness. Lungs: clear to auscultation bilaterally Breasts: normal appearance, no masses or tenderness Heart: S1, S2 normal-distant Abdomen: soft, non-tender; bowel sounds normal; no masses,  no organomegaly Pelvic: deferred Extremities: b/l knee pain Pulses: 2+ and symmetric Skin: Skin color, texture, turgor normal. No rashes or lesions Lymph nodes: Cervical, supraclavicular, and axillary nodes normal. Neurologic: Alert and oriented X 3, normal strength and tone. Normal symmetric reflexes. Normal coordination and gait     Predictors of intubation difficulty:  Morbid obesity? yes -   Anatomically abnormal facies? no  Prominent incisors? no  Receding mandible? no  Short, thick neck? no  Neck range of motion: normal  Dentition: No chipped, loose, or missing teeth.  Cardiographics ECG: RBBB Echocardiogram: not done  Imaging Chest x-ray: not done   Lab Review  No visits with results within 6 Month(s) from this visit.  Latest known visit with results is:  Office Visit on 06/07/2014  Component Date Value  . Sodium 06/07/2014 139   . Potassium 06/07/2014 3.9   . Chloride 06/07/2014 106   . CO2 06/07/2014 26   . Glucose, Bld 06/07/2014 99   . BUN 06/07/2014 17   . Creatinine, Ser 06/07/2014 0.68   . Calcium 06/07/2014 9.7   . GFR 06/07/2014 95.70   . WBC 06/07/2014 8.1   . RBC 06/07/2014 4.58   . Hemoglobin 06/07/2014 14.6   . HCT 06/07/2014 42.4   . MCV 06/07/2014 92.6   . MCHC 06/07/2014 34.4   . RDW 06/07/2014 13.6   . Platelets 06/07/2014 171.0   . Neutrophils Relative % 06/07/2014 67.8   . Lymphocytes Relative 06/07/2014 24.7   . Monocytes Relative 06/07/2014 5.0   . Eosinophils Relative 06/07/2014 1.9   . Basophils Relative 06/07/2014  0.6   . Neutro Abs 06/07/2014 5.5   . Lymphs Abs 06/07/2014 2.0   . Monocytes Absolute 06/07/2014 0.4   . Eosinophils Absolute 06/07/2014 0.2   . Basophils Absolute 06/07/2014 0.0   . Total Bilirubin 06/07/2014 0.2   . Bilirubin, Direct 06/07/2014 0.1   . Alkaline Phosphatase 06/07/2014 72   . AST 06/07/2014 15   . ALT 06/07/2014 16   . Total Protein 06/07/2014 7.3   . Albumin 06/07/2014 4.1   . Cholesterol 06/07/2014 161   . Triglycerides 06/07/2014 101.0   . HDL 06/07/2014 43.80   . VLDL 06/07/2014 20.2   . LDL Cholesterol 06/07/2014 97   . Total CHOL/HDL Ratio 06/07/2014 4   . NonHDL 06/07/2014 117.20   . TSH 06/07/2014 1.15   . CYTOLOGY - PAP 06/09/2014 PAP RESULT       Assessment:      55 y.o. female with planned surgery as above.   Known risk factors for perioperative complications: new EKG changes Difficulty with intubation is not anticipated.  Cardiac Risk Estimation:  New RBBB  Current medications which may produce withdrawal symptoms if withheld perioperatively: none   Plan:    1. Preoperative workup as follows ECG, hemoglobin, hematocrit, electrolytes, creatinine, glucose, liver function studies, coagulation studies.-- cardiology clearance due to new RBBB 2. Change in medication regimen before surgery: per surgical team. 3 DVT prophylaxis postoperatively:regimen to be chosen by surgical team. 4Other measures: consult [facility] if needed

## 2015-12-06 NOTE — Patient Instructions (Signed)
We will get a cardiology appointment for you

## 2015-12-06 NOTE — Progress Notes (Signed)
Pre visit review using our clinic review tool, if applicable. No additional management support is needed unless otherwise documented below in the visit note. 

## 2015-12-08 ENCOUNTER — Telehealth: Payer: Self-pay

## 2015-12-08 NOTE — Telephone Encounter (Signed)
Sent ov note to Dr. Dion SaucierLandau, per PCP. LB

## 2015-12-08 NOTE — Telephone Encounter (Signed)
Done

## 2015-12-14 ENCOUNTER — Other Ambulatory Visit (INDEPENDENT_AMBULATORY_CARE_PROVIDER_SITE_OTHER): Payer: PRIVATE HEALTH INSURANCE

## 2015-12-14 DIAGNOSIS — R9431 Abnormal electrocardiogram [ECG] [EKG]: Secondary | ICD-10-CM | POA: Diagnosis not present

## 2015-12-14 DIAGNOSIS — Z01818 Encounter for other preprocedural examination: Secondary | ICD-10-CM

## 2015-12-14 LAB — LIPID PANEL
CHOL/HDL RATIO: 4
Cholesterol: 193 mg/dL (ref 0–200)
HDL: 43.1 mg/dL (ref >39.00)
LDL Cholesterol: 126 mg/dL — ABNORMAL HIGH (ref 0–99)
NONHDL: 149.93
Triglycerides: 122 mg/dL (ref 0.0–149.0)
VLDL: 24.4 mg/dL (ref 0.0–40.0)

## 2015-12-14 LAB — POCT URINALYSIS DIPSTICK
Bilirubin, UA: NEGATIVE
GLUCOSE UA: NEGATIVE
Ketones, UA: NEGATIVE
Leukocytes, UA: NEGATIVE
NITRITE UA: NEGATIVE
PROTEIN UA: NEGATIVE
RBC UA: NEGATIVE
Spec Grav, UA: >=1.03
UROBILINOGEN UA: 0.2
pH, UA: 6

## 2015-12-14 LAB — CBC WITH DIFFERENTIAL/PLATELET
BASOS ABS: 0.1 10*3/uL (ref 0.0–0.1)
Basophils Relative: 0.8 % (ref 0.0–3.0)
Eosinophils Absolute: 0.3 10*3/uL (ref 0.0–0.7)
Eosinophils Relative: 4.1 % (ref 0.0–5.0)
HCT: 40.4 % (ref 36.0–46.0)
Hemoglobin: 13.8 g/dL (ref 12.0–15.0)
LYMPHS ABS: 1.9 10*3/uL (ref 0.7–4.0)
Lymphocytes Relative: 28.8 % (ref 12.0–46.0)
MCHC: 34 g/dL (ref 30.0–36.0)
MCV: 92.3 fl (ref 78.0–100.0)
MONO ABS: 0.5 10*3/uL (ref 0.1–1.0)
Monocytes Relative: 7.2 % (ref 3.0–12.0)
NEUTROS PCT: 59.1 % (ref 43.0–77.0)
Neutro Abs: 3.9 10*3/uL (ref 1.4–7.7)
Platelets: 196 10*3/uL (ref 150.0–400.0)
RBC: 4.38 Mil/uL (ref 3.87–5.11)
RDW: 13.7 % (ref 11.5–15.5)
WBC: 6.7 10*3/uL (ref 4.0–10.5)

## 2015-12-14 LAB — COMPREHENSIVE METABOLIC PANEL
ALT: 17 U/L (ref 0–35)
AST: 14 U/L (ref 0–37)
Albumin: 3.9 g/dL (ref 3.5–5.2)
Alkaline Phosphatase: 76 U/L (ref 39–117)
BUN: 18 mg/dL (ref 6–23)
CALCIUM: 9.2 mg/dL (ref 8.4–10.5)
CHLORIDE: 106 meq/L (ref 96–112)
CO2: 23 meq/L (ref 19–32)
Creatinine, Ser: 0.63 mg/dL (ref 0.40–1.20)
GFR: 103.93 mL/min (ref >60.00)
Glucose, Bld: 98 mg/dL (ref 70–99)
Potassium: 4 mEq/L (ref 3.5–5.1)
Sodium: 139 mEq/L (ref 135–145)
Total Bilirubin: 0.4 mg/dL (ref 0.2–1.2)
Total Protein: 6.9 g/dL (ref 6.0–8.3)

## 2015-12-14 LAB — TSH: TSH: 3.96 u[IU]/mL (ref 0.35–4.50)

## 2015-12-29 ENCOUNTER — Other Ambulatory Visit: Payer: Self-pay

## 2015-12-29 ENCOUNTER — Ambulatory Visit (HOSPITAL_COMMUNITY): Payer: PRIVATE HEALTH INSURANCE | Attending: Cardiology

## 2015-12-29 DIAGNOSIS — F17200 Nicotine dependence, unspecified, uncomplicated: Secondary | ICD-10-CM | POA: Insufficient documentation

## 2015-12-29 DIAGNOSIS — E785 Hyperlipidemia, unspecified: Secondary | ICD-10-CM | POA: Diagnosis not present

## 2015-12-29 DIAGNOSIS — R9431 Abnormal electrocardiogram [ECG] [EKG]: Secondary | ICD-10-CM

## 2015-12-29 DIAGNOSIS — I10 Essential (primary) hypertension: Secondary | ICD-10-CM | POA: Diagnosis not present

## 2015-12-30 ENCOUNTER — Ambulatory Visit (INDEPENDENT_AMBULATORY_CARE_PROVIDER_SITE_OTHER): Payer: PRIVATE HEALTH INSURANCE | Admitting: Cardiovascular Disease

## 2015-12-30 ENCOUNTER — Encounter: Payer: Self-pay | Admitting: Cardiovascular Disease

## 2015-12-30 VITALS — BP 174/100 | HR 86 | Ht 67.5 in | Wt 313.8 lb

## 2015-12-30 DIAGNOSIS — I4510 Unspecified right bundle-branch block: Secondary | ICD-10-CM | POA: Diagnosis not present

## 2015-12-30 DIAGNOSIS — I10 Essential (primary) hypertension: Secondary | ICD-10-CM

## 2015-12-30 NOTE — Patient Instructions (Signed)
Medication Instructions:  Your physician recommends that you continue on your current medications as directed. Please refer to the Current Medication list given to you today.   Labwork: none  Testing/Procedures: none  Follow-Up: Follow up with Dr. Nahser as needed.   Any Other Special Instructions Will Be Listed Below (If Applicable).     If you need a refill on your cardiac medications before your next appointment, please call your pharmacy.   

## 2015-12-30 NOTE — Progress Notes (Signed)
Cardiology Office Note   Date:  12/30/2015   ID:  Kristin Hubbard, DOB 20-Dec-1960, MRN [id number]  PCP:  Donato SchultzYvonne R Lowne Chase, DO  Cardiologist:   Kristeen MissPhilip Filiberto Wamble, MD   Chief Complaint  Patient presents with  . Hypertension   Problem list 1. Essential hypertension 2. Hypothyroidism 3. Hyperlipidemia    Kristin Hubbard is a 55 y.o. female who presents for further evaluation of a right bundle-branch block/abnormal EKG.    Seen with sister, Kristin Hubbard.   The right bundle-branch block is new since her previous EKG in 2011.   She has a  history of high blood pressure.  No cardiac symptoms.   No significant dyspnea Has white coat syndrome . 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  Does not follow a low salt diet   BP is usually well controlled.  Has gained some weight recently since she is not able to work out  Needs to have both knees replaced.   Here for pre-op visit.    Echo yesterday shows normal LV systolic function , grade 1 diastolic dysfunction.  Still smokes , 1 ppd .     Past Medical History:  Diagnosis Date  . Anxiety   . Hypertension   . Migraine   . Thyroid disease    Hypothyroidism    Past Surgical History:  Procedure Laterality Date  . BUNIONECTOMY     hammer toe  . CHOLECYSTECTOMY    . KNEE SURGERY  05/2007   left     Current Outpatient Prescriptions  Medication Sig Dispense Refill  . amLODipine (NORVASC) 5 MG tablet Take 1 tablet (5 mg total) by mouth daily. 30 tablet 5  . levothyroxine (SYNTHROID, LEVOTHROID) 150 MCG tablet TAKE ONE TABLET BY MOUTH ONCE DAILY **PATIENT NEEDS APPOINTMENT** 30 tablet 0  . nystatin cream (MYCOSTATIN) Apply 1 application topically 2 (two) times daily. 30 g 0  . oxyCODONE-acetaminophen (PERCOCET) 10-325 MG per tablet Take 1 tablet by mouth every 8 (eight) hours as needed for pain. 20 tablet 0  . simvastatin (ZOCOR) 40 MG tablet Take 1 tablet  (40 mg total) by mouth at bedtime. 30 tablet 5  . hydrochlorothiazide (HYDRODIURIL) 25 MG tablet Take 1 tablet (25 mg total) by mouth daily. 1 po qd prn (Patient not taking: Reported on 12/30/2015) 30 tablet 11   No current facility-administered medications for this visit.     Allergies:   Lisinopril   Social History:  The patient  reports that she has been smoking Cigarettes.  She has a 17.00 pack-year smoking history. She has never used smokeless tobacco. She reports that she does not drink alcohol or use drugs.   Family History:  The patient's family history includes Alcohol abuse in her father and mother; Diabetes in her father.   ROS:  Please see the history of present illness.    Review of Systems: Constitutional:  denies fever, chills, diaphoresis, appetite change and fatigue.  HEENT: denies photophobia, eye pain, redness, hearing loss, ear pain, congestion, sore throat, rhinorrhea, sneezing, neck pain, neck stiffness and tinnitus.  Respiratory: denies SOB, DOE, cough, chest tightness, and wheezing.  Cardiovascular: denies chest pain, palpitations and leg swelling.  Gastrointestinal: denies nausea, vomiting, abdominal pain, diarrhea, constipation, blood in stool.  Genitourinary: denies dysuria, urgency, frequency, hematuria, flank pain and difficulty urinating.  Musculoskeletal: denies  myalgias, back pain, joint swelling, arthralgias  and gait problem.   Skin: denies pallor, rash and wound.  Neurological: denies dizziness, seizures, syncope, weakness, light-headedness, numbness and headaches.   Hematological: denies adenopathy, easy bruising, personal or family bleeding history.  Psychiatric/ Behavioral: denies suicidal ideation, mood changes, confusion, nervousness, sleep disturbance and agitation.       All other systems are reviewed and negative.    PHYSICAL EXAM: VS:  BP (!) 174/100 (BP Location: Right Arm, Patient Position: Sitting, Cuff Size: Large)   Pulse 86   Ht 5'  7.5" (1.715 m)   Wt (!) 313 lb 12.8 oz (142.3 kg)   BMI 48.42 kg/m  , BMI Body mass index is 48.42 kg/m. GEN: Well nourished, well developed, in no acute distress  HEENT: normal  Neck: no JVD, carotid bruits, or masses Cardiac: RRR; no murmurs, rubs, or gallops,no edema  Respiratory:  clear to auscultation bilaterally, normal work of breathing GI: soft, nontender, nondistended, + BS MS: no deformity or atrophy  Skin: warm and dry, no rash Neuro:  Strength and sensation are intact Psych: normal   EKG:  EKG is not ordered today. The ekg ordered 12/06/15  demonstrates NSR at 92.   RBBB   Recent Labs: 12/14/2015: ALT 17; BUN 18; Creatinine, Ser 0.63; Hemoglobin 13.8; Platelets 196.0; Potassium 4.0; Sodium 139; TSH 3.96    Lipid Panel    Component Value Date/Time   CHOL 193 12/14/2015 0705   TRIG 122.0 12/14/2015 0705   TRIG 150 (H) 11/08/2005 1303   HDL 43.10 12/14/2015 0705   CHOLHDL 4 12/14/2015 0705   VLDL 24.4 12/14/2015 0705   LDLCALC 126 (H) 12/14/2015 0705   LDLDIRECT 173.6 08/22/2011 0937      Wt Readings from Last 3 Encounters:  12/30/15 (!) 313 lb 12.8 oz (142.3 kg)  12/06/15 (!) 313 lb 3.2 oz (142.1 kg)  05/12/15 288 lb 8 oz (130.9 kg)      Other studies Reviewed: Additional studies/ records that were reviewed today include: . Review of the above records demonstrates:    ASSESSMENT AND PLAN:  1.  Preoperative evaluation: [name] presents today for preoperative evaluation prior to knee surgery. She has a new right bundle branch block. She is completely asymptomatic. Echocardiogram performed yesterday reveals normal left trigger systolic function. She has grade 1 diastolic dysfunction. The echo was otherwise unremarkable.  I've advised her to stop smoking. I've advised her to lose weight which she should be able to do after her knee surgery.  She is at low risk for her upcoming knee surgeries.  She'll follow-up with her medical doctor. I will see her on  an as-needed basis.  Current medicines are reviewed at length with the patient today.  The patient does not have concerns regarding medicines.  Labs/ tests ordered today include:  No orders of the defined types were placed in this encounter.   Disposition:   FU with me as needed.      Kristeen MissPhilip Avleen Bordwell, MD  12/30/2015 9:07 AM    [facility] [address], [place], [place]  [address] Phone: [phone]([phone]; Fax: [phone]

## 2016-01-03 ENCOUNTER — Other Ambulatory Visit: Payer: Self-pay | Admitting: Family Medicine

## 2016-01-03 ENCOUNTER — Telehealth: Payer: Self-pay | Admitting: Family Medicine

## 2016-01-03 NOTE — Telephone Encounter (Signed)
Caller name: Relationship to patient: Pharmacy Can be reached: [phone] Pharm[facility]:  [facility] [address] [facility] [place], [place] - [address] [phone] (Phone) [phone] (Fax)     Reason for call: Pharmacy needs clarification on levothyroxine (SYNTHROID, LEVOTHROID) 150 MCG tablet [[id number]

## 2016-01-03 NOTE — Telephone Encounter (Signed)
[facility] wanted to let us know that they had a different brand and wanted to know would it be ok to change. The brand that patient usually gets, they cannot get anymore.  Please advise.

## 2016-01-03 NOTE — Telephone Encounter (Signed)
Pharmacy will relay message to patient.

## 2016-01-03 NOTE — Telephone Encounter (Signed)
Pt has to be ok with it-- I prefer brand synthroid

## 2016-01-18 ENCOUNTER — Encounter (HOSPITAL_COMMUNITY): Payer: Self-pay

## 2016-01-18 NOTE — Pre-Procedure Instructions (Signed)
Kristin AzureRobin Hubbard  01/18/2016      [facility] - [place], [place] - [address] [place] [address] Phone: [phone] Fax: [phone]    Your procedure is scheduled on Tuesday January 9.  [facility]:30 A.M.  Call this number if you have problems the morning of surgery:  [phone]   Remember:  Do not eat food or drink liquids after midnight.  Take these medicines the morning of surgery with A SIP OF WATER: amlodipine (norvasc), levothyroxine (synthroid), oxycodone (percocet) if needed  7 days prior to surgery STOP taking any Aspirin, Aleve, Naproxen, Ibuprofen, Motrin, Advil, Goody's, BC's, all herbal medications, fish oil, and all vitamins    Do not wear jewelry, make-up or nail polish.  Do not wear lotions, powders, or perfumes, or deoderant.  Do not shave 48 hours prior to surgery.  Men may shave face and neck.  Do not bring valuables to the hospital.  [facility] is not responsible for any belongings or valuables.  Contacts, dentures or bridgework may not be worn into surgery.  Leave your suitcase in the car.  After surgery it may be brought to your room.  For patients admitted to the hospital, discharge time will be determined by your treatment team.  Patients discharged the day of surgery will not be allowed to drive home.    Special instructions:    [place]- Preparing For Surgery  Before surgery, you can play an important role. Because skin is not sterile, your skin needs to be as free of germs as possible. You can reduce the number of germs on your skin by washing with CHG (chlorahexidine gluconate) Soap before surgery.  CHG is an antiseptic cleaner which kills germs and bonds with the skin to continue killing germs even after washing.  Please do not use if you have an allergy to CHG or antibacterial soaps. If your skin becomes reddened/irritated stop using the CHG.  Do not  shave (including legs and underarms) for at least 48 hours prior to first CHG shower. It is OK to shave your face.  Please follow these instructions carefully.   1. Shower the NIGHT BEFORE SURGERY and the MORNING OF SURGERY with CHG.   2. If you chose to wash your hair, wash your hair first as usual with your normal shampoo.  3. After you shampoo, rinse your hair and body thoroughly to remove the shampoo.  4. Use CHG as you would any other liquid soap. You can apply CHG directly to the skin and wash gently with a scrungie or a clean washcloth.   5. Apply the CHG Soap to your body ONLY FROM THE NECK DOWN.  Do not use on open wounds or open sores. Avoid contact with your eyes, ears, mouth and genitals (private parts). Wash genitals (private parts) with your normal soap.  6. Wash thoroughly, paying special attention to the area where your surgery will be performed.  7. Thoroughly rinse your body with warm water from the neck down.  8. DO NOT shower/wash with your normal soap after using and rinsing off the CHG Soap.  9. Pat yourself dry with a CLEAN TOWEL.   10. Wear CLEAN PAJAMAS   11. Place CLEAN SHEETS on your bed the night of your first shower and DO NOT SLEEP WITH PETS.    Day of Surgery: Do not apply any deodorants/lotions. Please wear clean clothes to the hospital/surgery  center.      Please read over the following fact sheets that you were given. Total Joint Packet and MRSA Information

## 2016-01-19 ENCOUNTER — Encounter (HOSPITAL_COMMUNITY)
Admission: RE | Admit: 2016-01-19 | Discharge: 2016-01-19 | Disposition: A | Discharge status: Home / Self Care | Payer: PRIVATE HEALTH INSURANCE | Source: Ambulatory Visit | Attending: Orthopedic Surgery | Admitting: Orthopedic Surgery

## 2016-01-19 ENCOUNTER — Encounter (HOSPITAL_COMMUNITY): Payer: Self-pay

## 2016-01-19 DIAGNOSIS — Z01812 Encounter for preprocedural laboratory examination: Secondary | ICD-10-CM | POA: Diagnosis not present

## 2016-01-19 DIAGNOSIS — F17200 Nicotine dependence, unspecified, uncomplicated: Secondary | ICD-10-CM | POA: Diagnosis not present

## 2016-01-19 DIAGNOSIS — I10 Essential (primary) hypertension: Secondary | ICD-10-CM | POA: Diagnosis not present

## 2016-01-19 DIAGNOSIS — E039 Hypothyroidism, unspecified: Secondary | ICD-10-CM | POA: Diagnosis not present

## 2016-01-19 HISTORY — DX: Hypothyroidism, unspecified: E03.9

## 2016-01-19 HISTORY — DX: Other complications of anesthesia, initial encounter: T88.59XA

## 2016-01-19 HISTORY — DX: Adverse effect of unspecified anesthetic, initial encounter: T41.45XA

## 2016-01-19 LAB — CBC
HEMATOCRIT: 42 % (ref 36.0–46.0)
HEMOGLOBIN: 14.1 g/dL (ref 12.0–15.0)
MCH: 31.3 pg (ref 26.0–34.0)
MCHC: 33.6 g/dL (ref 30.0–36.0)
MCV: 93.1 fL (ref 78.0–100.0)
Platelets: 220 10*3/uL (ref 150–400)
RBC: 4.51 MIL/uL (ref 3.87–5.11)
RDW: 13.4 % (ref 11.5–15.5)
WBC: 7.8 10*3/uL (ref 4.0–10.5)

## 2016-01-19 LAB — SURGICAL PCR SCREEN
MRSA, PCR: NEGATIVE
STAPHYLOCOCCUS AUREUS: NEGATIVE

## 2016-01-19 LAB — BASIC METABOLIC PANEL
ANION GAP: 8 (ref 5–15)
BUN: 12 mg/dL (ref 6–20)
CO2: 24 mmol/L (ref 22–32)
Calcium: 9.2 mg/dL (ref 8.9–10.3)
Chloride: 105 mmol/L (ref 101–111)
Creatinine, Ser: 0.57 mg/dL (ref 0.44–1.00)
GFR calc Af Amer: >60 mL/min (ref >60)
GLUCOSE: 98 mg/dL (ref 65–99)
POTASSIUM: 4.2 mmol/L (ref 3.5–5.1)
Sodium: 137 mmol/L (ref 135–145)

## 2016-01-19 NOTE — Progress Notes (Signed)
PCP: Dr. Loann QuillY. Lowne Cardiologist: Dr. Melburn PopperNasher  Left message for Kristin MesiSherri Hubbard @ Dr. Shelba FlakeLandau's office that ted hose knee length for pt. Is not in stock here.

## 2016-01-24 NOTE — Progress Notes (Signed)
Anesthesia Chart Review:  Pt is a 56 year old female scheduled for B total knee arthroplasty on 01/31/2016 with Teryl LucyJoshua Landau, MD.   - PCP is Seabron SpatesYvonne Lowne Chase, DO.  - Saw Kristeen MissPhilip Nahser, MD with cardiology on 12/30/15 for pre-op eval and cleared her at low risk for surgery.   PMH includes:  HTN, hypothyroidism. Current smoker. BMI 48.   Medications include: amlodipine, hctz, levothyroxine, simvastatin  Preoperative labs reviewed.    EKG 12/06/15: sinus rhythm. RBBB.   Echo 12/29/15:  - Left ventricle: The cavity size was normal. Wall thickness was increased in a pattern of mild LVH. Systolic function was normal. The estimated ejection fraction was in the range of 55% to 60%. Wall motion was normal; there were no regional wall motion abnormalities. Doppler parameters are consistent with abnormal left ventricular relaxation (grade 1 diastolic dysfunction). - Aortic valve: There was no stenosis. - Mitral valve: Mildly calcified annulus. There was trivial regurgitation. - Right ventricle: The cavity size was normal. Systolic function was normal. - Pulmonary arteries: No complete TR doppler jet so unable to estimate PA systolic pressure. - Inferior vena cava: The vessel was normal in size. The respirophasic diameter changes were in the normal range (>= 50%), consistent with normal central venous pressure.  If no changes, I anticipate pt can proceed with surgery as scheduled.   Rica Mastngela Marilee Ditommaso, FNP-BC [facility] Phone: [phone] 01/24/2016 1:54 PM

## 2016-01-30 MED ORDER — CEFAZOLIN SODIUM 10 G IJ SOLR
3.0000 g | INTRAMUSCULAR | Status: AC
  Administered 2016-01-31 (×2): 3 g via INTRAVENOUS
  Filled 2016-01-30: qty 3000

## 2016-01-30 NOTE — Anesthesia Preprocedure Evaluation (Addendum)
Anesthesia Evaluation  Patient identified by MRN, date of birth, ID band Patient awake    Reviewed: Allergy & Precautions, H&P , NPO status , Patient's Chart, lab work & pertinent test results  Airway Mallampati: III  TM Distance: >3 FB Neck ROM: Full    Dental no notable dental hx. (+) Teeth Intact, Dental Advisory Given   Pulmonary Current Smoker,    Pulmonary exam normal breath sounds clear to auscultation       Cardiovascular Exercise Tolerance: Good hypertension, Pt. on medications  Rhythm:Regular Rate:Normal     Neuro/Psych  Headaches, Anxiety Depression negative psych ROS   GI/Hepatic negative GI ROS, Neg liver ROS,   Endo/Other  Hypothyroidism Morbid obesity  Renal/GU negative Renal ROS  negative genitourinary   Musculoskeletal   Abdominal   Peds  Hematology negative hematology ROS (+)   Anesthesia Other Findings   Reproductive/Obstetrics negative OB ROS                            Anesthesia Physical Anesthesia Plan  ASA: III  Anesthesia Plan: MAC and Spinal   Post-op Pain Management:    Induction: Intravenous  Airway Management Planned: Simple Face Mask  Additional Equipment:   Intra-op Plan:   Post-operative Plan:   Informed Consent: I have reviewed the patients History and Physical, chart, labs and discussed the procedure including the risks, benefits and alternatives for the proposed anesthesia with the patient or authorized representative who has indicated his/her understanding and acceptance.   Dental advisory given  Plan Discussed with: CRNA  Anesthesia Plan Comments:        Anesthesia Quick Evaluation

## 2016-01-31 ENCOUNTER — Encounter (HOSPITAL_COMMUNITY)
Admission: RE | Disposition: A | Discharge status: Home Health | Payer: Self-pay | Source: Ambulatory Visit | Attending: Orthopedic Surgery

## 2016-01-31 ENCOUNTER — Encounter (HOSPITAL_COMMUNITY): Payer: Self-pay | Admitting: *Deleted

## 2016-01-31 ENCOUNTER — Inpatient Hospital Stay (HOSPITAL_COMMUNITY)
Admission: RE | Admit: 2016-01-31 | Discharge: 2016-02-03 | DRG: 462 | Disposition: A | Discharge status: Home Health | Payer: PRIVATE HEALTH INSURANCE | Source: Ambulatory Visit | Attending: Orthopedic Surgery | Admitting: Orthopedic Surgery

## 2016-01-31 ENCOUNTER — Inpatient Hospital Stay (HOSPITAL_COMMUNITY): Payer: PRIVATE HEALTH INSURANCE | Admitting: Emergency Medicine

## 2016-01-31 ENCOUNTER — Inpatient Hospital Stay (HOSPITAL_COMMUNITY): Payer: PRIVATE HEALTH INSURANCE

## 2016-01-31 ENCOUNTER — Inpatient Hospital Stay (HOSPITAL_COMMUNITY): Payer: PRIVATE HEALTH INSURANCE | Admitting: Anesthesiology

## 2016-01-31 DIAGNOSIS — Z888 Allergy status to other drugs, medicaments and biological substances status: Secondary | ICD-10-CM | POA: Diagnosis not present

## 2016-01-31 DIAGNOSIS — G43909 Migraine, unspecified, not intractable, without status migrainosus: Secondary | ICD-10-CM | POA: Diagnosis present

## 2016-01-31 DIAGNOSIS — F419 Anxiety disorder, unspecified: Secondary | ICD-10-CM | POA: Diagnosis present

## 2016-01-31 DIAGNOSIS — Z79899 Other long term (current) drug therapy: Secondary | ICD-10-CM

## 2016-01-31 DIAGNOSIS — E039 Hypothyroidism, unspecified: Secondary | ICD-10-CM | POA: Diagnosis present

## 2016-01-31 DIAGNOSIS — I10 Essential (primary) hypertension: Secondary | ICD-10-CM | POA: Diagnosis present

## 2016-01-31 DIAGNOSIS — Z6841 Body Mass Index (BMI) 40.0 and over, adult: Secondary | ICD-10-CM

## 2016-01-31 DIAGNOSIS — Z79891 Long term (current) use of opiate analgesic: Secondary | ICD-10-CM

## 2016-01-31 DIAGNOSIS — M170 Bilateral primary osteoarthritis of knee: Secondary | ICD-10-CM

## 2016-01-31 DIAGNOSIS — F17210 Nicotine dependence, cigarettes, uncomplicated: Secondary | ICD-10-CM | POA: Diagnosis present

## 2016-01-31 DIAGNOSIS — D62 Acute posthemorrhagic anemia: Secondary | ICD-10-CM | POA: Diagnosis not present

## 2016-01-31 DIAGNOSIS — Z96653 Presence of artificial knee joint, bilateral: Secondary | ICD-10-CM

## 2016-01-31 HISTORY — DX: Bilateral primary osteoarthritis of knee: M17.0

## 2016-01-31 HISTORY — PX: TOTAL KNEE ARTHROPLASTY: SHX125

## 2016-01-31 SURGERY — ARTHROPLASTY, KNEE, BILATERAL, TOTAL
Anesthesia: Monitor Anesthesia Care | Laterality: Bilateral

## 2016-01-31 MED ORDER — HYDROMORPHONE HCL 1 MG/ML IJ SOLN
INTRAMUSCULAR | Status: AC
  Filled 2016-01-31: qty 1.5

## 2016-01-31 MED ORDER — HYDROCHLOROTHIAZIDE 25 MG PO TABS
25.0000 mg | ORAL_TABLET | Freq: Every day | ORAL | Status: DC
  Administered 2016-01-31 – 2016-02-03 (×4): 25 mg via ORAL
  Filled 2016-01-31 (×4): qty 1

## 2016-01-31 MED ORDER — ALUM & MAG HYDROXIDE-SIMETH 200-200-20 MG/5ML PO SUSP: 30.0000 mL | ORAL | Status: DC | PRN

## 2016-01-31 MED ORDER — HYDROMORPHONE HCL 1 MG/ML IJ SOLN
0.2500 mg | INTRAMUSCULAR | Status: DC | PRN
  Administered 2016-01-31: 0.25 mg via INTRAVENOUS
  Administered 2016-01-31: 1 mg via INTRAVENOUS
  Administered 2016-01-31: 0.25 mg via INTRAVENOUS
  Administered 2016-01-31: 0.5 mg via INTRAVENOUS

## 2016-01-31 MED ORDER — METOCLOPRAMIDE HCL 5 MG PO TABS: 5.0000 mg | ORAL_TABLET | Freq: Three times a day (TID) | ORAL | Status: DC | PRN

## 2016-01-31 MED ORDER — HYDROMORPHONE HCL 1 MG/ML IJ SOLN
INTRAMUSCULAR | Status: AC
  Filled 2016-01-31: qty 0.5

## 2016-01-31 MED ORDER — SENNA-DOCUSATE SODIUM 8.6-50 MG PO TABS: 2.0000 | ORAL_TABLET | Freq: Every day | ORAL | 1 refills | Status: DC

## 2016-01-31 MED ORDER — BUPIVACAINE LIPOSOME 1.3 % IJ SUSP
INTRAMUSCULAR | Status: DC | PRN
  Administered 2016-01-31: 10 mL

## 2016-01-31 MED ORDER — ONDANSETRON HCL 4 MG PO TABS: 4.0000 mg | ORAL_TABLET | Freq: Four times a day (QID) | ORAL | Status: DC | PRN

## 2016-01-31 MED ORDER — ACETAMINOPHEN 325 MG PO TABS: 650.0000 mg | ORAL_TABLET | Freq: Four times a day (QID) | ORAL | Status: DC | PRN

## 2016-01-31 MED ORDER — METHOCARBAMOL 500 MG PO TABS
ORAL_TABLET | ORAL | Status: AC
  Filled 2016-01-31: qty 1

## 2016-01-31 MED ORDER — ONDANSETRON HCL 4 MG/2ML IJ SOLN
INTRAMUSCULAR | Status: AC
  Filled 2016-01-31: qty 2

## 2016-01-31 MED ORDER — METOCLOPRAMIDE HCL 5 MG/ML IJ SOLN: 5.0000 mg | Freq: Three times a day (TID) | INTRAMUSCULAR | Status: DC | PRN

## 2016-01-31 MED ORDER — KETOROLAC TROMETHAMINE 15 MG/ML IJ SOLN
7.5000 mg | Freq: Four times a day (QID) | INTRAMUSCULAR | Status: AC
  Administered 2016-02-01 (×3): 7.5 mg via INTRAVENOUS
  Filled 2016-01-31 (×4): qty 1

## 2016-01-31 MED ORDER — FENTANYL CITRATE (PF) 100 MCG/2ML IJ SOLN
INTRAMUSCULAR | Status: AC
  Filled 2016-01-31: qty 2

## 2016-01-31 MED ORDER — RIVAROXABAN 10 MG PO TABS
10.0000 mg | ORAL_TABLET | Freq: Every day | ORAL | Status: DC
Start: 2016-02-01 — End: 2016-02-03
  Administered 2016-02-01 – 2016-02-03 (×3): 10 mg via ORAL
  Filled 2016-01-31 (×3): qty 1

## 2016-01-31 MED ORDER — ACETAMINOPHEN 500 MG PO TABS
ORAL_TABLET | ORAL | Status: AC
  Filled 2016-01-31: qty 2

## 2016-01-31 MED ORDER — MENTHOL 3 MG MT LOZG: 1.0000 | LOZENGE | OROMUCOSAL | Status: DC | PRN

## 2016-01-31 MED ORDER — PHENYLEPHRINE HCL 10 MG/ML IJ SOLN
INTRAVENOUS | Status: DC | PRN
  Administered 2016-01-31: 25 ug/min via INTRAVENOUS

## 2016-01-31 MED ORDER — BUPIVACAINE HCL (PF) 0.5 % IJ SOLN
INTRAMUSCULAR | Status: DC | PRN
  Administered 2016-01-31: 15 mg via INTRATHECAL

## 2016-01-31 MED ORDER — METHOCARBAMOL 500 MG PO TABS
500.0000 mg | ORAL_TABLET | Freq: Four times a day (QID) | ORAL | Status: DC | PRN
  Administered 2016-01-31: 500 mg via ORAL
  Filled 2016-01-31: qty 1

## 2016-01-31 MED ORDER — CEFAZOLIN SODIUM-DEXTROSE 2-4 GM/100ML-% IV SOLN
2.0000 g | Freq: Four times a day (QID) | INTRAVENOUS | Status: AC
  Administered 2016-01-31 (×2): 2 g via INTRAVENOUS
  Filled 2016-01-31 (×2): qty 100

## 2016-01-31 MED ORDER — KETOROLAC TROMETHAMINE 15 MG/ML IJ SOLN: 7.5000 mg | Freq: Four times a day (QID) | INTRAMUSCULAR | Status: DC

## 2016-01-31 MED ORDER — ACETAMINOPHEN 10 MG/ML IV SOLN
INTRAVENOUS | Status: DC | PRN
  Administered 2016-01-31: 1000 mg via INTRAVENOUS

## 2016-01-31 MED ORDER — MIDAZOLAM HCL 2 MG/2ML IJ SOLN
INTRAMUSCULAR | Status: AC
  Filled 2016-01-31: qty 2

## 2016-01-31 MED ORDER — GABAPENTIN 600 MG PO TABS
600.0000 mg | ORAL_TABLET | Freq: Three times a day (TID) | ORAL | Status: DC
  Administered 2016-01-31 – 2016-02-03 (×9): 600 mg via ORAL
  Filled 2016-01-31 (×9): qty 1

## 2016-01-31 MED ORDER — KETOROLAC TROMETHAMINE 15 MG/ML IJ SOLN
7.5000 mg | Freq: Four times a day (QID) | INTRAMUSCULAR | Status: DC
  Administered 2016-01-31: 7.5 mg via INTRAVENOUS
  Filled 2016-01-31: qty 1

## 2016-01-31 MED ORDER — CHLORHEXIDINE GLUCONATE 4 % EX LIQD: 60.0000 mL | Freq: Once | CUTANEOUS | Status: DC

## 2016-01-31 MED ORDER — SODIUM CHLORIDE 0.9 % IJ SOLN
INTRAMUSCULAR | Status: AC
  Filled 2016-01-31: qty 20

## 2016-01-31 MED ORDER — ATORVASTATIN CALCIUM 20 MG PO TABS
20.0000 mg | ORAL_TABLET | Freq: Every day | ORAL | Status: DC
  Administered 2016-01-31 – 2016-02-02 (×3): 20 mg via ORAL
  Filled 2016-01-31 (×3): qty 1

## 2016-01-31 MED ORDER — BISACODYL 10 MG RE SUPP: 10.0000 mg | Freq: Every day | RECTAL | Status: DC | PRN

## 2016-01-31 MED ORDER — ONDANSETRON HCL 4 MG PO TABS: 4.0000 mg | ORAL_TABLET | Freq: Three times a day (TID) | ORAL | 0 refills | Status: DC | PRN

## 2016-01-31 MED ORDER — POLYETHYLENE GLYCOL 3350 17 G PO PACK: 17.0000 g | PACK | Freq: Every day | ORAL | Status: DC | PRN

## 2016-01-31 MED ORDER — RIVAROXABAN 10 MG PO TABS: 10.0000 mg | ORAL_TABLET | Freq: Every day | ORAL | 0 refills | Status: DC

## 2016-01-31 MED ORDER — ACETAMINOPHEN 650 MG RE SUPP: 650.0000 mg | Freq: Four times a day (QID) | RECTAL | Status: DC | PRN

## 2016-01-31 MED ORDER — LEVOTHYROXINE SODIUM 75 MCG PO TABS
150.0000 ug | ORAL_TABLET | Freq: Every day | ORAL | Status: DC
  Administered 2016-02-01 – 2016-02-03 (×3): 150 ug via ORAL
  Filled 2016-01-31 (×4): qty 2

## 2016-01-31 MED ORDER — METHOCARBAMOL 1000 MG/10ML IJ SOLN
500.0000 mg | Freq: Four times a day (QID) | INTRAVENOUS | Status: DC | PRN
  Filled 2016-01-31: qty 5

## 2016-01-31 MED ORDER — FENTANYL CITRATE (PF) 100 MCG/2ML IJ SOLN
INTRAMUSCULAR | Status: DC | PRN
  Administered 2016-01-31: 50 ug via INTRAVENOUS
  Administered 2016-01-31 (×2): 25 ug via INTRAVENOUS
  Administered 2016-01-31: 50 ug via INTRAVENOUS
  Administered 2016-01-31 (×2): 25 ug via INTRAVENOUS

## 2016-01-31 MED ORDER — OXYCODONE HCL 5 MG PO TABS
ORAL_TABLET | ORAL | Status: AC
  Filled 2016-01-31: qty 2

## 2016-01-31 MED ORDER — POTASSIUM CHLORIDE IN NACL 20-0.45 MEQ/L-% IV SOLN
INTRAVENOUS | Status: DC
  Administered 2016-01-31: 75 mL via INTRAVENOUS
  Administered 2016-02-01: 06:00:00 via INTRAVENOUS
  Filled 2016-01-31 (×3): qty 1000

## 2016-01-31 MED ORDER — ACETAMINOPHEN 500 MG PO TABS
1000.0000 mg | ORAL_TABLET | Freq: Once | ORAL | Status: AC
  Administered 2016-01-31: 1000 mg via ORAL

## 2016-01-31 MED ORDER — AMLODIPINE BESYLATE 5 MG PO TABS
5.0000 mg | ORAL_TABLET | Freq: Every day | ORAL | Status: DC
  Administered 2016-01-31 – 2016-02-03 (×4): 5 mg via ORAL
  Filled 2016-01-31 (×4): qty 1

## 2016-01-31 MED ORDER — NYSTATIN 100000 UNIT/GM EX CREA
1.0000 "application " | TOPICAL_CREAM | Freq: Two times a day (BID) | CUTANEOUS | Status: DC | PRN
  Filled 2016-01-31: qty 15

## 2016-01-31 MED ORDER — ACETAMINOPHEN 10 MG/ML IV SOLN
INTRAVENOUS | Status: AC
  Filled 2016-01-31: qty 100

## 2016-01-31 MED ORDER — ONDANSETRON HCL 4 MG/2ML IJ SOLN: 4.0000 mg | Freq: Four times a day (QID) | INTRAMUSCULAR | Status: DC | PRN

## 2016-01-31 MED ORDER — GABAPENTIN 300 MG PO CAPS
600.0000 mg | ORAL_CAPSULE | Freq: Once | ORAL | Status: AC
  Administered 2016-01-31: 600 mg via ORAL
  Filled 2016-01-31 (×2): qty 2

## 2016-01-31 MED ORDER — MAGNESIUM CITRATE PO SOLN: 1.0000 | Freq: Once | ORAL | Status: DC | PRN

## 2016-01-31 MED ORDER — OXYCODONE-ACETAMINOPHEN 10-325 MG PO TABS: 1.0000 | ORAL_TABLET | Freq: Three times a day (TID) | ORAL | 0 refills | Status: DC | PRN

## 2016-01-31 MED ORDER — ONDANSETRON HCL 4 MG/2ML IJ SOLN
INTRAMUSCULAR | Status: DC | PRN
  Administered 2016-01-31: 4 mg via INTRAVENOUS

## 2016-01-31 MED ORDER — LACTATED RINGERS IV SOLN
INTRAVENOUS | Status: DC | PRN
  Administered 2016-01-31 (×2): via INTRAVENOUS

## 2016-01-31 MED ORDER — BACLOFEN 10 MG PO TABS: 10.0000 mg | ORAL_TABLET | Freq: Three times a day (TID) | ORAL | 0 refills | Status: DC

## 2016-01-31 MED ORDER — PROPOFOL 500 MG/50ML IV EMUL
INTRAVENOUS | Status: DC | PRN
  Administered 2016-01-31: 100 ug/kg/min via INTRAVENOUS

## 2016-01-31 MED ORDER — SODIUM CHLORIDE 0.9 % IR SOLN
Status: DC | PRN
  Administered 2016-01-31: 1000 mL

## 2016-01-31 MED ORDER — DEXAMETHASONE SODIUM PHOSPHATE 10 MG/ML IJ SOLN
INTRAMUSCULAR | Status: AC
  Filled 2016-01-31: qty 1

## 2016-01-31 MED ORDER — OXYCODONE HCL 5 MG PO TABS
5.0000 mg | ORAL_TABLET | ORAL | Status: DC | PRN
  Administered 2016-01-31: 10 mg via ORAL
  Administered 2016-01-31: 5 mg via ORAL
  Administered 2016-01-31: 10 mg via ORAL
  Administered 2016-02-01: 5 mg via ORAL
  Administered 2016-02-01 – 2016-02-02 (×2): 10 mg via ORAL
  Administered 2016-02-02: 5 mg via ORAL
  Administered 2016-02-03 (×2): 10 mg via ORAL
  Filled 2016-01-31 (×3): qty 2
  Filled 2016-01-31: qty 1
  Filled 2016-01-31: qty 2
  Filled 2016-01-31: qty 1
  Filled 2016-01-31 (×2): qty 2
  Filled 2016-01-31: qty 1

## 2016-01-31 MED ORDER — CELECOXIB 100 MG PO CAPS
100.0000 mg | ORAL_CAPSULE | Freq: Two times a day (BID) | ORAL | Status: DC
  Administered 2016-02-01 – 2016-02-03 (×4): 100 mg via ORAL
  Filled 2016-01-31 (×4): qty 1

## 2016-01-31 MED ORDER — HYDROMORPHONE HCL 2 MG/ML IJ SOLN: 0.5000 mg | INTRAMUSCULAR | Status: DC | PRN

## 2016-01-31 MED ORDER — DOCUSATE SODIUM 100 MG PO CAPS
100.0000 mg | ORAL_CAPSULE | Freq: Two times a day (BID) | ORAL | Status: DC
  Administered 2016-01-31 – 2016-02-03 (×6): 100 mg via ORAL
  Filled 2016-01-31 (×6): qty 1

## 2016-01-31 MED ORDER — SIMVASTATIN 40 MG PO TABS: 40.0000 mg | ORAL_TABLET | Freq: Every day | ORAL | Status: DC

## 2016-01-31 MED ORDER — HYDROMORPHONE HCL 2 MG PO TABS: 2.0000 mg | ORAL_TABLET | ORAL | 0 refills | Status: DC | PRN

## 2016-01-31 MED ORDER — PHENOL 1.4 % MT LIQD
1.0000 | OROMUCOSAL | Status: DC | PRN
Start: 2016-01-31 — End: 2016-02-03

## 2016-01-31 MED ORDER — SODIUM CHLORIDE 0.9 % IV SOLN
0.1000 mg/kg/h | INTRAVENOUS | Status: DC
  Administered 2016-01-31: 0.5 mg/kg/h via INTRAVENOUS
  Filled 2016-01-31 (×2): qty 2

## 2016-01-31 MED ORDER — BUPIVACAINE HCL (PF) 0.5 % IJ SOLN: INTRAMUSCULAR | Status: DC | PRN

## 2016-01-31 MED ORDER — DEXAMETHASONE SODIUM PHOSPHATE 10 MG/ML IJ SOLN
INTRAMUSCULAR | Status: DC | PRN
  Administered 2016-01-31: 10 mg via INTRAVENOUS

## 2016-01-31 MED ORDER — DIPHENHYDRAMINE HCL 12.5 MG/5ML PO ELIX: 12.5000 mg | ORAL_SOLUTION | ORAL | Status: DC | PRN

## 2016-01-31 MED ORDER — CEFAZOLIN SODIUM 1 G IJ SOLR
INTRAMUSCULAR | Status: AC
  Filled 2016-01-31: qty 30

## 2016-01-31 MED ORDER — DEXAMETHASONE SODIUM PHOSPHATE 10 MG/ML IJ SOLN
10.0000 mg | Freq: Once | INTRAMUSCULAR | Status: AC
  Administered 2016-02-01: 10 mg via INTRAVENOUS
  Filled 2016-01-31: qty 1

## 2016-01-31 MED ORDER — PROPOFOL 10 MG/ML IV BOLUS
INTRAVENOUS | Status: AC
  Filled 2016-01-31: qty 20

## 2016-01-31 MED ORDER — SENNA 8.6 MG PO TABS
1.0000 | ORAL_TABLET | Freq: Two times a day (BID) | ORAL | Status: DC
  Administered 2016-01-31 – 2016-02-03 (×6): 8.6 mg via ORAL
  Filled 2016-01-31 (×6): qty 1

## 2016-01-31 MED ORDER — CELECOXIB 200 MG PO CAPS
200.0000 mg | ORAL_CAPSULE | Freq: Once | ORAL | Status: AC
  Administered 2016-01-31: 200 mg via ORAL

## 2016-01-31 MED ORDER — MIDAZOLAM HCL 5 MG/5ML IJ SOLN
INTRAMUSCULAR | Status: DC | PRN
  Administered 2016-01-31: 2 mg via INTRAVENOUS
  Administered 2016-01-31 (×2): 1 mg via INTRAVENOUS

## 2016-01-31 MED ORDER — CELECOXIB 200 MG PO CAPS
ORAL_CAPSULE | ORAL | Status: AC
  Filled 2016-01-31: qty 1

## 2016-01-31 SURGICAL SUPPLY — 56 items
BANDAGE ESMARK 6X9 LF (GAUZE/BANDAGES/DRESSINGS) ×2 IMPLANT
BLADE SAG 18X100X1.27 (BLADE) ×12 IMPLANT
BLADE SAW SGTL 13X75X1.27 (BLADE) IMPLANT
BNDG CMPR 9X6 STRL LF SNTH (GAUZE/BANDAGES/DRESSINGS) ×2
BNDG CMPR MED 10X6 ELC LF (GAUZE/BANDAGES/DRESSINGS) ×4
BNDG ELASTIC 6X10 VLCR STRL LF (GAUZE/BANDAGES/DRESSINGS) ×12 IMPLANT
BNDG ESMARK 6X9 LF (GAUZE/BANDAGES/DRESSINGS) ×6
BOWL SMART MIX CTS (DISPOSABLE) ×3 IMPLANT
CAPT KNEE TOTAL 3 ×6 IMPLANT
CEMENT HV SMART SET (Cement) ×12 IMPLANT
COVER SURGICAL LIGHT HANDLE (MISCELLANEOUS) ×3 IMPLANT
CUFF TOURNIQUET SINGLE 44IN (TOURNIQUET CUFF) ×6 IMPLANT
DRAPE EXTREMITY T 121X128X90 (DRAPE) ×3 IMPLANT
DRAPE HALF SHEET 40X57 (DRAPES) ×3 IMPLANT
DRAPE U-SHAPE 47X51 STRL (DRAPES) ×3 IMPLANT
DURAPREP 26ML APPLICATOR (WOUND CARE) ×3 IMPLANT
ELECT CAUTERY BLADE 6.4 (BLADE) ×3 IMPLANT
ELECT PENCIL ROCKER SW 15FT (MISCELLANEOUS) ×3 IMPLANT
ELECT REM PT RETURN 9FT ADLT (ELECTROSURGICAL) ×3
ELECTRODE REM PT RTRN 9FT ADLT (ELECTROSURGICAL) ×1 IMPLANT
GAUZE SPONGE 4X4 12PLY STRL (GAUZE/BANDAGES/DRESSINGS) ×3 IMPLANT
GLOVE BIOGEL PI ORTHO PRO SZ8 (GLOVE) ×12
GLOVE ORTHO TXT STRL SZ7.5 (GLOVE) ×6 IMPLANT
GLOVE PI ORTHO PRO STRL SZ8 (GLOVE) ×6 IMPLANT
GLOVE SURG ORTHO 8.0 STRL STRW (GLOVE) ×15 IMPLANT
GOWN STRL REUS W/ TWL XL LVL3 (GOWN DISPOSABLE) ×1 IMPLANT
GOWN STRL REUS W/TWL 2XL LVL3 (GOWN DISPOSABLE) ×3 IMPLANT
GOWN STRL REUS W/TWL XL LVL3 (GOWN DISPOSABLE) ×3
HANDPIECE INTERPULSE COAX TIP (DISPOSABLE) ×3
HOOD PEEL AWAY FACE SHEILD DIS (HOOD) ×12 IMPLANT
IMMOBILIZER KNEE 22 (SOFTGOODS) ×6 IMPLANT
KIT BASIN OR (CUSTOM PROCEDURE TRAY) ×3 IMPLANT
KIT ROOM TURNOVER OR (KITS) ×3 IMPLANT
MANIFOLD NEPTUNE II (INSTRUMENTS) ×3 IMPLANT
NEEDLE 18GX1X1/2 (RX/OR ONLY) (NEEDLE) ×3 IMPLANT
NS IRRIG 1000ML POUR BTL (IV SOLUTION) ×3 IMPLANT
PACK TOTAL JOINT (CUSTOM PROCEDURE TRAY) ×3 IMPLANT
PAD ABD 8X10 STRL (GAUZE/BANDAGES/DRESSINGS) ×6 IMPLANT
PAD ARMBOARD 7.5X6 YLW CONV (MISCELLANEOUS) ×6 IMPLANT
PAD CAST 4YDX4 CTTN HI CHSV (CAST SUPPLIES) ×2 IMPLANT
PADDING CAST COTTON 4X4 STRL (CAST SUPPLIES) ×6
PADDING CAST COTTON 6X4 STRL (CAST SUPPLIES) ×6 IMPLANT
SET HNDPC FAN SPRY TIP SCT (DISPOSABLE) ×1 IMPLANT
SPONGE LAP 18X18 X RAY DECT (DISPOSABLE) ×3 IMPLANT
SUCTION FRAZIER HANDLE 10FR (MISCELLANEOUS) ×2
SUCTION TUBE FRAZIER 10FR DISP (MISCELLANEOUS) ×1 IMPLANT
SUT VIC AB 0 CT1 27 (SUTURE) ×12
SUT VIC AB 0 CT1 27XBRD ANBCTR (SUTURE) ×4 IMPLANT
SUT VIC AB 2-0 CT1 27 (SUTURE) ×9
SUT VIC AB 2-0 CT1 TAPERPNT 27 (SUTURE) ×3 IMPLANT
SUT VIC AB 3-0 SH 8-18 (SUTURE) ×9 IMPLANT
SYR 30ML LL (SYRINGE) IMPLANT
TOWEL OR 17X24 6PK STRL BLUE (TOWEL DISPOSABLE) ×6 IMPLANT
TOWEL OR 17X26 10 PK STRL BLUE (TOWEL DISPOSABLE) ×3 IMPLANT
TRAY FOLEY CATH 14FR (SET/KITS/TRAYS/PACK) ×3 IMPLANT
TUBING BULK SUCTION (MISCELLANEOUS) ×3 IMPLANT

## 2016-01-31 NOTE — Transfer of Care (Signed)
Immediate Anesthesia Transfer of Care Note  Patient: Kristin Hubbard  Procedure(s) Performed: Procedure(s): TOTAL KNEE BILATERAL (Bilateral)  Patient Location: PACU  Anesthesia Type:Spinal  Level of Consciousness: awake and alert   Airway & Oxygen Therapy: Patient Spontanous Breathing and Patient connected to face mask oxygen  Post-op Assessment: Report given to RN, Post -op Vital signs reviewed and stable and Patient moving all extremities X 4  Post vital signs: Reviewed and stable  Last Vitals:  Vitals:   01/31/16 0633  BP: 125/75  Pulse: 87  Resp: 20  Temp: 36.6 C    Last Pain:  Vitals:   01/31/16 0633  TempSrc: Oral  PainSc:       Patients Stated Pain Goal: 5 (01/31/16 [id number])  Complications: No apparent anesthesia complications

## 2016-01-31 NOTE — Op Note (Signed)
DATE OF SURGERY:  01/31/2016 TIME: 12:43 PM  PATIENT NAME:  Kristin Hubbard   AGE: 56 y.o.    PRE-OPERATIVE DIAGNOSIS:  BILATERAL  KNEE PRIMARY LOCALIZED OSTEOARTHRITIS  POST-OPERATIVE DIAGNOSIS:  Same  PROCEDURE:  Procedure(s): TOTAL KNEE BILATERAL   SURGEON:  Eulas Post, MD   ASSISTANT:  Janace Litten, OPA-C, present and scrubbed throughout the case, critical for assistance with exposure, retraction, instrumentation, and closure.  2nd ASSISTANT:  Kirstin Shrader, OPA-C  Estimated blood loss: 100 mL for the right, and 100 mm for the left.  OPERATIVE IMPLANTS: RIGHT: Biomet Vanguard posterior stabilized total knee system with a size 65 femur, size 34 standard patellar button, size 71 fixed bearing tibia, size 12 mm polyethylene bearing and to femoral lug pegs with 2 bags of Smart set HV bone cement.  Left femur had a size 65 femur, size 67 tibia, with a size 12 mm polyethylene bearing, and a size 34 mm standard patellar button with 2 femoral lug pegs and 2 bags of Smart set HV bone cement   PREOPERATIVE INDICATIONS:  Kristin Hubbard is a 56 y.o. year old female with end stage bone on bone degenerative arthritis of the knee who failed conservative treatment, including injections, antiinflammatories, activity modification, and assistive devices, and had significant impairment of their activities of daily living, and elected for Total Knee Arthroplasty. She had windswept knees with the right leg being valgus in the left knee being varus. She had hypertrophic osteophyte formation posteriorly and diffusely, with severe restriction in motion.  The risks, benefits, and alternatives were discussed at length including but not limited to the risks of infection, bleeding, nerve injury, stiffness, blood clots, the need for revision surgery, cardiopulmonary complications, among others, and they were willing to proceed.  OPERATIVE FINDINGS AND UNIQUE ASPECTS OF THE CASE:  The right knee was valgus,  and I had to cut the femur twice, there was substantial osteophyte formation posteriorly. Multiple loose bodies.  The left knee was extremely tight, and would not flex beyond 80, I had to cut the femur twice and the tibia twice.  The right knee had diffuse grade 4 chondral loss throughout the knee, the left knee had chondral loss over the anterior flange of the lateral femoral trochlea, and complete loss on the medial side, with hypertrophic osteophyte throughout.  OPERATIVE DESCRIPTION:  The patient was brought to the operative room and placed in a supine position.  Spinal anesthesia was administered.  IV antibiotics were given.  The lower extremity was prepped and draped in the usual sterile fashion.  Time out was performed.  The leg was elevated and exsanguinated and the tourniquet was inflated. Total tourniquet time was 99 minutes at 300 mmHg.  Anterior quadriceps tendon splitting approach was performed.  The patella was everted and osteophytes were removed.  The anterior horn of the medial and lateral meniscus was removed.   The distal femur was opened with the drill and the intramedullary distal femoral cutting jig was utilized, set at 5 degrees resecting 10 mm off the distal femur.  Care was taken to protect the collateral ligaments.  Then the extramedullary tibial cutting jig was utilized making the appropriate cut using the anterior tibial crest as a reference building in appropriate posterior slope.  Care was taken during the cut to protect the medial and collateral ligaments.  The proximal tibia was removed along with the posterior horns of the menisci.  The PCL was sacrificed.    The extensor gap was measured and  was approximately 10mm.    The distal femoral sizing jig was applied, taking care to avoid notching.  Then the 4-in-1 cutting jig was applied and the anterior and posterior femur was cut, along with the chamfer cuts.  I applied this at 5 external rotation in order to  compensate for a hypoplastic lateral femoral condyle. All posterior osteophytes were removed.  The flexion gap was then measured and was symmetric with the extension gap.  I completed the distal femoral preparation using the appropriate jig to prepare the box.  The patella was then measured, and cut with the saw.  The thickness before the cut was 23 and after the cut was 14.  The proximal tibia sized and prepared accordingly with the reamer and the punch, and then all components were trialed with the 10mm poly insert.  This was somewhat loose, and I trialed with a 12 and had better restoration of motion, and did not go into hyperextension. The knee was found to have excellent balance and full motion.    The above named components were then cemented into place and all excess cement was removed.  The real polyethylene implant was placed.  After the cement had cured I released the tourniquet and confirmed excellent hemostasis with no major posterior vessel injury.    The knee was easily taken through a range of motion and the patella tracked well and the knee irrigated copiously and the parapatellar and subcutaneous tissue closed with vicryl, and monocryl with steri strips for the skin.  We also injected exparel with marcaine before closure.  I then performed a totally second prep and drape, mainly because her legs were so large and was concerned that prepping and draping both legs at the same time would risk contamination. I then performed the same operation on the left knee, with the difference being that I applied the tibial cutting jig at 4 medially to correct the varus alignment. I did have to cut the femur and tibia twice because it was so tight.  The patella was 23 mm before and 14 mm after the cut.  The exact vexing procedure was performed as listed above on the contralateral side, and I did inject Exparel as well.  The wounds were injected with marcaine, and dressed with sterile gauze and the  patient was awakened and returned to the PACU in stable and satisfactory condition.  There were no complications.  Total tourniquet time was 111 minutes on the left side.

## 2016-01-31 NOTE — Progress Notes (Signed)
Care of pt assumed by MA Aylan Bayona RN from M. Brande RN 

## 2016-01-31 NOTE — Anesthesia Procedure Notes (Signed)
Spinal  Patient location during procedure: OR Start time: 01/31/2016 7:30 AM End time: 01/31/2016 7:35 AM Staffing Anesthesiologist: Gaynelle AduFITZGERALD, Robertine Kipper Performed: anesthesiologist  Preanesthetic Checklist Completed: patient identified, surgical consent, pre-op evaluation, timeout performed, IV checked, risks and benefits discussed and monitors and equipment checked Spinal Block Patient position: sitting Prep: DuraPrep Patient monitoring: cardiac monitor, continuous pulse ox and blood pressure Approach: midline Location: L3-4 Injection technique: single-shot Needle Needle type: Pencan  Needle gauge: 24 G Needle length: 9 cm Assessment Sensory level: T8 Additional Notes Functioning IV was confirmed and monitors were applied. Sterile prep and drape, including hand hygiene and sterile gloves were used. The patient was positioned and the spine was prepped. The skin was anesthetized with lidocaine.  Free flow of clear CSF was obtained prior to injecting local anesthetic into the CSF.  The spinal needle aspirated freely following injection.  The needle was carefully withdrawn.  The patient tolerated the procedure well.

## 2016-01-31 NOTE — H&P (Signed)
PREOPERATIVE H&P  Chief Complaint: OA BILATERAL  KNEE  HPI: Kristin Hubbard is a 56 y.o. female who presents for preoperative history and physical with a diagnosis of OA BILATERAL  KNEE. Symptoms are rated as moderate to severe, and have been worsening.  This is significantly impairing activities of daily living.  She has elected for surgical management.   She has failed injections, activity modification, anti-inflammatories, and assistive devices, as well as previous knee arthrsocopy.  Preoperative X-rays demonstrate end stage degenerative changes with osteophyte formation, loss of joint space, subchondral sclerosis.   Past Medical History:  Diagnosis Date  . Anxiety   . Complication of anesthesia    after 1 st surgery was combative,other surgeries have been fine  . Hypertension   . Hypothyroidism   . Migraine   . Thyroid disease    Hypothyroidism   Past Surgical History:  Procedure Laterality Date  . BUNIONECTOMY     hammer toe  . CHOLECYSTECTOMY    . KNEE SURGERY  05/2007   left   Social History   Social History  . Marital status: Single    Spouse name: N/A  . Number of children: N/A  . Years of education: N/A   Occupational History  . RECEPTionist [employer]   Social History Main Topics  . Smoking status: Current Every Day Smoker    Packs/day: 1.00    Years: 17.00    Types: Cigarettes  . Smokeless tobacco: Never Used     Comment: she said when she loses 100 lbs she will quit smoking  . Alcohol use No  . Drug use: No  . Sexual activity: Not Currently    Partners: Male   Other Topics Concern  . None   Social History Narrative   Exercise--  Trainer and gym   Family History  Problem Relation Age of Onset  . Diabetes    . Alcohol abuse Father   . Diabetes Father   . Alcohol abuse Mother    Allergies  Allergen Reactions  . Lisinopril Cough   Prior to Admission medications   Medication Sig Start Date End Date Taking? Authorizing Provider   acetaminophen (TYLENOL) 650 MG CR tablet Take 1,300 mg by mouth daily as needed for pain.   Yes Historical Provider, MD  amLODipine (NORVASC) 5 MG tablet TAKE ONE TABLET BY MOUTH ONCE DAILY Patient taking differently: TAKE 5 MG BY MOUTH ONCE DAILY 01/03/16  Yes Yvonne R Lowne Chase, DO  hydrochlorothiazide (HYDRODIURIL) 25 MG tablet Take 1 tablet (25 mg total) by mouth daily. 1 po qd prn 12/06/15 12/05/16 Yes Yvonne R Lowne Chase, DO  ibuprofen (ADVIL,MOTRIN) 200 MG tablet Take 800 mg by mouth every 6 (six) hours as needed for headache or moderate pain.   Yes Historical Provider, MD  levothyroxine (SYNTHROID, LEVOTHROID) 150 MCG tablet TAKE ONE TABLET BY MOUTH ONCE DAILY. PT NEED AN APPOINTMENT FOR FURTHER REFILLS Patient taking differently: TAKE 150 MG BY MOUTH ONCE DAILY. PT NEED AN APPOINTMENT FOR FURTHER REFILLS 01/03/16  Yes Yvonne R Lowne Chase, DO  nystatin cream (MYCOSTATIN) Apply 1 application topically 2 (two) times daily. Patient taking differently: Apply 1 application topically 2 (two) times daily as needed.  09/07/13  Yes Yvonne R Lowne Chase, DO  oxyCODONE-acetaminophen (PERCOCET/ROXICET) 5-325 MG tablet Take 1-2 tablets by mouth every 6 (six) hours as needed for severe pain.   Yes Historical Provider, MD  simvastatin (ZOCOR) 40 MG tablet Take 1 tablet (40 mg total) by mouth at  bedtime. 05/12/15  Yes Yvonne R Lowne Chase, DO  oxyCODONE-acetaminophen (PERCOCET) 10-325 MG per tablet Take 1 tablet by mouth every 8 (eight) hours as needed for pain. Patient not taking: Reported on 01/19/2016 09/23/13   Lenn SinkNorman S Regal, DPM     Positive ROS: All other systems have been reviewed and were otherwise negative with the exception of those mentioned in the HPI and as above.  Physical Exam: General: Alert, no acute distress Cardiovascular: No pedal edema Respiratory: No cyanosis, no use of accessory musculature GI: No organomegaly, abdomen is soft and non-tender Skin: No lesions in the area of  chief complaint Neurologic: Sensation intact distally Psychiatric: Patient is competent for consent with normal mood and affect Lymphatic: No axillary or cervical lymphadenopathy  MUSCULOSKELETAL: right knee active range of motion 10 to 100, there is a flexion contracture. She has valgus alignment of the right knee.  Left knee has range of motion from 10 to 120, with varus alignment. Crepitance in both knees with positive effusion and pain diffusely.  Assessment: OA BILATERAL  KNEE   Plan: Plan for Procedure(s): TOTAL KNEE BILATERAL  The risks benefits and alternatives were discussed with the patient including but not limited to the risks of nonoperative treatment, versus surgical intervention including infection, bleeding, nerve injury,  blood clots, cardiopulmonary complications, morbidity, mortality, among others, and they were willing to proceed.   Eulas PostLANDAU,Arlo Buffone P, MD Cell [phone]   01/31/2016 7:20 AM

## 2016-01-31 NOTE — Anesthesia Procedure Notes (Signed)
Procedure Name: MAC Date/Time: 01/31/2016 7:39 AM Performed by: Kyung Rudd Pre-anesthesia Checklist: Patient identified, Emergency Drugs available, Suction available and Patient being monitored Patient Re-evaluated:Patient Re-evaluated prior to inductionOxygen Delivery Method: Simple face mask Intubation Type: IV induction Placement Confirmation: positive ETCO2

## 2016-01-31 NOTE — Anesthesia Postprocedure Evaluation (Signed)
Anesthesia Post Note  Patient: Kristin Hubbard  Procedure(s) Performed: Procedure(s) (LRB): TOTAL KNEE BILATERAL (Bilateral)  Patient location during evaluation: PACU Anesthesia Type: MAC and Spinal Level of consciousness: awake and alert Pain management: pain level controlled Vital Signs Assessment: post-procedure vital signs reviewed and stable Respiratory status: spontaneous breathing and respiratory function stable Cardiovascular status: blood pressure returned to baseline and stable Postop Assessment: spinal receding Anesthetic complications: no       Last Vitals:  Vitals:   01/31/16 1415 01/31/16 1430  BP:    Pulse: (!) 103 (!) 106  Resp: 20 15  Temp:      Last Pain:  Vitals:   01/31/16 1430  TempSrc:   PainSc: 10-Worst pain ever                 Oyinkansola Truax,W. EDMOND

## 2016-02-01 ENCOUNTER — Encounter (HOSPITAL_COMMUNITY): Payer: Self-pay | Admitting: Orthopedic Surgery

## 2016-02-01 LAB — CBC
HEMATOCRIT: 29 % — AB (ref 36.0–46.0)
HEMOGLOBIN: 9.6 g/dL — AB (ref 12.0–15.0)
MCH: 31 pg (ref 26.0–34.0)
MCHC: 33.1 g/dL (ref 30.0–36.0)
MCV: 93.5 fL (ref 78.0–100.0)
Platelets: 142 10*3/uL — ABNORMAL LOW (ref 150–400)
RBC: 3.1 MIL/uL — AB (ref 3.87–5.11)
RDW: 13.8 % (ref 11.5–15.5)
WBC: 10 10*3/uL (ref 4.0–10.5)

## 2016-02-01 LAB — BASIC METABOLIC PANEL
ANION GAP: 7 (ref 5–15)
BUN: 18 mg/dL (ref 6–20)
CO2: 22 mmol/L (ref 22–32)
Calcium: 8.2 mg/dL — ABNORMAL LOW (ref 8.9–10.3)
Chloride: 105 mmol/L (ref 101–111)
Creatinine, Ser: 0.78 mg/dL (ref 0.44–1.00)
GFR calc non Af Amer: >60 mL/min (ref >60)
Glucose, Bld: 160 mg/dL — ABNORMAL HIGH (ref 65–99)
POTASSIUM: 4.3 mmol/L (ref 3.5–5.1)
SODIUM: 134 mmol/L — AB (ref 135–145)

## 2016-02-01 NOTE — Progress Notes (Signed)
Orthopedic Tech Progress Note Patient Details:  Kristin AzureRobin Hubbard 1960-04-05 [id number]  CPM Left Knee CPM Left Knee: On Left Knee Flexion (Degrees): 60 Left Knee Extension (Degrees): 0 CPM Right Knee CPM Right Knee: Other (Comment) Additional Comments: pt wants to alternate knees   Trinna PostMartinez, Andrius Andrepont J 02/01/2016, 7:37 AM

## 2016-02-01 NOTE — Progress Notes (Signed)
02/01/16 1200  PT Visit Information  Last PT Received On 02/01/16  Assistance Needed +3 or more (chair follow)  History of Present Illness Pt is a 56 y/o female who underwent B TKA on 01/31/2016. She had windswept knees with the right leg being valgus in the left knee being varus. She had hypertrophic osteophyte formation posteriorly and diffusely, with severe restriction in motion. PMH of thyroid disease, HTN, and anxiety  Subjective Data  Patient Stated Goal to go home  Precautions  Precautions Fall;Knee  Precaution Booklet Issued Yes (comment)  Precaution Comments handout given  Restrictions  Weight Bearing Restrictions Yes  RLE Weight Bearing WBAT  LLE Weight Bearing WBAT  Pain Assessment  Pain Assessment 0-10  Pain Score 8  Pain Location B knees  Pain Descriptors / Indicators Aching;Grimacing;Guarding;Sore  Pain Intervention(s) Limited activity within patient's tolerance;Monitored during session;Repositioned;RN gave pain meds during session;Ice applied  Cognition  Arousal/Alertness Awake/alert  Behavior During Therapy WFL for tasks assessed/performed  Overall Cognitive Status Within Functional Limits for tasks assessed  Bed Mobility  Overal bed mobility Needs Assistance  Bed Mobility Sit to Supine  Sit to supine Min assist  General bed mobility comments Min A for LE mobility. VCs for hand placement and sequencing. Requires min A for positioning of pelvis in bed with bed pad  Transfers  Overall transfer level Needs assistance  Equipment used Rolling walker (2 wheeled)  Transfers Sit to/from Stand  Sit to Stand +2 physical assistance;Max assist  General transfer comment Pt requires at least x3 attempts to successfully stands. Is unable to stand using momentum to throw body forwards. Requires Max A to get trunk to upright and maintain standing balance. VCs for positioning, hand placement, and posture  Ambulation/Gait  Ambulation/Gait assistance Mod assist (+3 physical  assistance and chair follow )  Ambulation Distance (Feet) 50 Feet  Assistive device Rolling walker (2 wheeled)  Gait Pattern/deviations Step-to pattern;Decreased stride length;Antalgic;Trunk flexed  General Gait Details Impulsive during ambulation and attempts to speed up despite imbalance and reliance on PT to prevent posterior lean. Requires cues for trunk upright and to stay close to walker. Fatigues quickly and requires immediate sit down.   Gait velocity decreased  Gait velocity interpretation Below normal speed for age/gender  Balance  Overall balance assessment Needs assistance  Sitting-balance support Single extremity supported;Feet supported  Sitting balance-Leahy Scale Fair  Postural control Posterior lean  Standing balance support Bilateral upper extremity supported;During functional activity  Standing balance-Leahy Scale Poor  Standing balance comment Requires BUE to maintain static standing balance  Total Joint Exercises  Goniometric ROM R 30 to 70; L 15 to 60  PT - End of Session  Equipment Utilized During Treatment Gait belt  Activity Tolerance Patient limited by fatigue;Patient limited by pain  Patient left with call bell/phone within reach;in bed;in CPM  Nurse Communication Other (comment);Mobility status (IV beeping)  CPM Right Knee  CPM Right Knee On  Right Knee Flexion (Degrees) 65  Right Knee Extension (Degrees) 0  PT - Assessment/Plan  PT Plan Current plan remains appropriate  PT Frequency (ACUTE ONLY) 7X/week  Follow Up Recommendations SNF (HHPT with 24 hr supervision if refuses)  PT equipment None recommended by PT  PT Goal Progression  Progress towards PT goals Progressing toward goals  Acute Rehab PT Goals  PT Goal Formulation With patient  Time For Goal Achievement 02/15/16  Potential to Achieve Goals Fair  PT Time Calculation  PT Start Time (ACUTE ONLY) 1024  PT Stop  Time (ACUTE ONLY) 1104  PT Time Calculation (min) (ACUTE ONLY) 40 min  PT  General Charges  $$ ACUTE PT VISIT 1 Procedure  PT Treatments  $Gait Training 23-37 mins  $Self Care/Home Management 8-22   Pt impulsive during ambulation today. Pt with increased speed despite poor balance and reliance on therapist to prevent posterior lean. Demonstrates poor understanding of safety and deficits as she is currently at high risk for knee collapse/falls 2/2 weak quads and quick onset of fatigue as indicated by morning session. Pt continues to require multiple attempts to stand and continues to throw weight in order to power to stand. This PT feels that pt would benefit from SNF following d/c but pt continues to refuse. Therefore, recommending HHPT to address decreased activity tolerance and mobility before return to previous lifestyle. Discussed with pt that she does not transfer from low surfaces at home as she did in today's sessions (from chair). Will attempt to practice transfers from similar height surfaces tomorrow to ensure that she will be able to safely transfer with sister at home. PT will continue to follow acutely.   Gaye Pollack, SPT [phone]

## 2016-02-01 NOTE — Progress Notes (Signed)
Physical Therapy Evaluation Patient Details Name: Kristin Hubbard MRN: [id number] DOB: 1960-09-11 Today's Date: 02/01/2016   History of Present Illness  Pt is a 56 y/o female who underwent B TKA on 01/31/2016. She had windswept knees with the right leg being valgus in the left knee being varus. She had hypertrophic osteophyte formation posteriorly and diffusely, with severe restriction in motion. PMH of thyroid disease, HTN, and anxiety  Clinical Impression  PTA, pt ambulated with walking stick and [place]. Pt currently living with sister who will be available 24/7 and daughter (currently in high school) who is at home in the evenings after school. Pt requires multiple attempts to stand from bed and is only successful when she uses momentum to throw her weight back and forcefully push herself upwards. Pt limited 2/2 B quad weakness and pain. Pt able to ambulate 25 ft but fatigues quickly and requires immediate sit down. Pt unsteady with ambulation and requires frequent verbal cues for trunk upright. Pt would likely benefit from SNF following d/c as she will only have her sister assisting during the day (currently requires +2 PTs to mobilize) but pt refuses SNF and CIR. Recommending HHPT to decrease caregiver burden and address decreased activity tolerance and mobility. PT will continue to follow acutely.    Follow Up Recommendations Home health PT;Supervision/Assistance - 24 hour    Equipment Recommendations  None recommended by PT    Recommendations for Other Services       Precautions / Restrictions Precautions Precautions: Fall;Knee Precaution Booklet Issued: Yes (comment) Precaution Comments: handout given Restrictions Weight Bearing Restrictions: Yes RLE Weight Bearing: Weight bearing as tolerated LLE Weight Bearing: Weight bearing as tolerated      Mobility  Bed Mobility Overal bed mobility: Needs Assistance Bed Mobility: Sit to Supine     Supine to sit: Min assist Sit to supine:  Min assist   General bed mobility comments: Min A for LE mobility. VCs for hand placement and sequencing. Requires min A for positioning of pelvis in bed with bed pad  Transfers Overall transfer level: Needs assistance Equipment used: Rolling walker (2 wheeled) Transfers: Sit to/from Stand Sit to Stand: +2 physical assistance;Max assist         General transfer comment: Pt requires at least x3 attempts to successfully stand. +2 therapists blocking feet from sliding forward, as well as providing max assist to power to stand. Pt uses momentum to throw her weight forward to stand. Verbal cues for positioning, posture, and hand placement.   Ambulation/Gait Ambulation/Gait assistance: Mod assist (+3 physical assistance and chair follow) Ambulation Distance (Feet): 25 Feet Assistive device: Rolling walker (2 wheeled) Gait Pattern/deviations: Step-to pattern;Decreased stride length;Antalgic;Trunk flexed Gait velocity: decreased Gait velocity interpretation: Below normal speed for age/gender General Gait Details: Slow, unsteady gait. Forward lean with ambulation. Requires Mod A as she is at high risk for falls/knee buckling. Quickly fatigues and needs assist to be lowered to chair.    Stairs            Wheelchair Mobility    Modified Rankin (Stroke Patients Only)       Balance Overall balance assessment: Needs assistance Sitting-balance support: Single extremity supported;Feet supported Sitting balance-Leahy Scale: Fair     Standing balance support: Bilateral upper extremity supported;During functional activity Standing balance-Leahy Scale: Poor Standing balance comment: Requires BUE to maintain static standing balance  Pertinent Vitals/Pain Pain Assessment: 0-10 Pain Score: 8  Pain Location: B knees Pain Descriptors / Indicators: Aching;Grimacing;Guarding;Sore Pain Intervention(s): Limited activity within patient's  tolerance;Monitored during session;Repositioned;RN gave pain meds during session;Ice applied    Home Living Family/patient expects to be discharged to:: Private residence Living Arrangements: Children;Other relatives (sister and daughter) Available Help at Discharge: Family;Available 24 hours/day;Neighbor (sister and daughter; neighbor prn) Type of Home: Apartment Home Access: Level entry     Home Layout: One level Home Equipment: Bedside commode;Walker - 2 wheels;Other (comment);Tub bench;Hand held shower head;Hospital bed (walker; hospital bed coming today)      Prior Function Level of Independence: Independent with assistive device(s)         Comments: used walking stick and walker     Hand Dominance   Dominant Hand: Right    Extremity/Trunk Assessment   Upper Extremity Assessment Upper Extremity Assessment: Defer to OT evaluation    Lower Extremity Assessment Lower Extremity Assessment: RLE deficits/detail;LLE deficits/detail;Generalized weakness RLE Deficits / Details: Decreased ROM and strength as expected post-operatively. Pt notes that this is her "cane leg" and she tends to walk with her RLE straight.  LLE Deficits / Details: Decreased ROM and strength as expected postoperatively.       Communication   Communication: No difficulties  Cognition Arousal/Alertness: Awake/alert Behavior During Therapy: WFL for tasks assessed/performed Overall Cognitive Status: Within Functional Limits for tasks assessed                      General Comments      Exercises Total Joint Exercises Ankle Circles/Pumps: AROM;Both;10 reps;Seated Long Arc Quad: AROM;Both;5 reps;Seated Knee Flexion: AROM;Both;5 reps;Seated Goniometric ROM: R 15 to 65; L 30 to 80   Assessment/Plan    PT Assessment Patient needs continued PT services  PT Problem List Decreased strength;Decreased range of motion;Decreased activity tolerance;Decreased balance;Decreased mobility;Decreased  coordination;Decreased knowledge of use of DME;Decreased safety awareness;Decreased knowledge of precautions;Pain          PT Treatment Interventions Gait training;DME instruction;Functional mobility training;Therapeutic activities;Therapeutic exercise;Balance training;Patient/family education    PT Goals (Current goals can be found in the Care Plan section)  Acute Rehab PT Goals Patient Stated Goal: to go home PT Goal Formulation: With patient Time For Goal Achievement: 02/15/16 Potential to Achieve Goals: Fair    Frequency 7X/week   Barriers to discharge Decreased caregiver support pt with sister during day. Pt confident that sister will be able to assist with transfers/mobility but pt currently requires +2 PTs to stand. Will monitor progress    Co-evaluation               End of Session Equipment Utilized During Treatment: Gait belt Activity Tolerance: Patient limited by fatigue;Patient limited by pain Patient left: in chair;with call bell/phone within reach Nurse Communication: Mobility status         Time: [id number] PT Time Calculation (min) (ACUTE ONLY): 49 min   Charges:   PT Evaluation $PT Eval Moderate Complexity: 1 Procedure PT Treatments $Gait Training: 8-22 mins $Therapeutic Exercise: 8-22 mins   PT G Codes:        Gaye PollackRebecca Dalilah Curlin 02/01/2016, 12:08 PM Gaye Pollackebecca Adithi Gammon, SPT [phone]

## 2016-02-01 NOTE — Progress Notes (Signed)
Patient ID: Kristin Hubbard, female   DOB: March 10, 1960, 56 y.o.   MRN: [id number]     Subjective:  Patient reports pain as mild.  Patient sitting up in bed and doing well and not having much pain  Objective:   VITALS:   Vitals:   01/31/16 1445 01/31/16 1530 01/31/16 2046 02/01/16 0647  BP:  (!) 149/75 121/60 125/62  Pulse: (!) 103 (!) 101 98 94  Resp: 15 16 16 16   Temp: 97.1 F (36.2 C) 97.6 F (36.4 C) 98 F (36.7 C) 98 F (36.7 C)  TempSrc:   Axillary Oral  SpO2: 98% 98% 97% 98%  Weight:      Height:        ABD soft Sensation intact distally Dorsiflexion/Plantar flexion intact Incision: dressing C/D/I and no drainage Patient in CPM and has no pain with passive ROM  Lab Results  Component Value Date   WBC 10.0 02/01/2016   HGB 9.6 (L) 02/01/2016   HCT 29.0 (L) 02/01/2016   MCV 93.5 02/01/2016   PLT 142 (L) 02/01/2016   BMET    Component Value Date/Time   NA 134 (L) 02/01/2016 0542   K 4.3 02/01/2016 0542   CL 105 02/01/2016 0542   CO2 22 02/01/2016 0542   GLUCOSE 160 (H) 02/01/2016 0542   GLUCOSE 111 (H) 11/08/2005 1303   BUN 18 02/01/2016 0542   CREATININE 0.78 02/01/2016 0542   CREATININE 0.62 04/21/2010 1524   CALCIUM 8.2 (L) 02/01/2016 0542   GFRNONAA >60 02/01/2016 0542   GFRAA >60 02/01/2016 0542     Assessment/Plan: 1 Day Post-Op   Principal Problem:   Bilateral primary osteoarthritis of knee Active Problems:   Morbid obesity (HCC)   S/P total knee arthroplasty, bilateral   Advance diet Up with therapy Plan for discharge tomorrow WBAT Plan Dressing change tomorrow    Haskel KhanDOUGLAS PARRY, BRANDON 02/01/2016, 8:39 AM  Discussed and agree with above. Will continue to work on mobility.   Teryl LucyJoshua Steffie Waggoner, MD Cell [phone]

## 2016-02-01 NOTE — Progress Notes (Addendum)
Orthopedic Tech Progress Note Patient Details:  Kristin AzureRobin Hubbard Apr 23, 1960 [id number] Put on cpm RLE at 1900 Patient ID: Kristin Hubbard, female   DOB: Apr 23, 1960, 56 y.o.   MRN: [id number]   [name], [name] 02/01/2016, 6:57 PM

## 2016-02-02 LAB — CBC
HCT: 28.1 % — ABNORMAL LOW (ref 36.0–46.0)
HEMOGLOBIN: 9.3 g/dL — AB (ref 12.0–15.0)
MCH: 30.8 pg (ref 26.0–34.0)
MCHC: 33.1 g/dL (ref 30.0–36.0)
MCV: 93 fL (ref 78.0–100.0)
Platelets: 155 10*3/uL (ref 150–400)
RBC: 3.02 MIL/uL — ABNORMAL LOW (ref 3.87–5.11)
RDW: 13.6 % (ref 11.5–15.5)
WBC: 11.9 10*3/uL — AB (ref 4.0–10.5)

## 2016-02-02 MED ORDER — DIAZEPAM 5 MG PO TABS: 5.0000 mg | ORAL_TABLET | Freq: Four times a day (QID) | ORAL | 0 refills | Status: DC | PRN

## 2016-02-02 NOTE — Progress Notes (Signed)
Orthopedic Tech Progress Note Patient Details:  Kristin Hubbard Aug 05, 1960 [id number]  Patient ID: Kristin Hubbard, female   DOB: Aug 05, 1960, 56 y.o.   MRN: [id number] Applied l cpm 0-65  Trinna PostMartinez, Dmitriy Gair J 02/02/2016, 7:06 AM

## 2016-02-02 NOTE — Progress Notes (Signed)
Physical Therapy Treatment Patient Details Name: Kristin Hubbard MRN: [id number] DOB: Nov 11, 1960 Today's Date: 02/02/2016    History of Present Illness Pt is a 56 y/o female who underwent B TKA on 01/31/2016. She had windswept knees with the right leg being valgus in the left knee being varus. She had hypertrophic osteophyte formation posteriorly and diffusely, with severe restriction in motion. PMH of thyroid disease, HTN, and anxiety    PT Comments    PT in room at the end of OT's session. Pt's sister, Kristin Hubbard, present in session. Kristin Hubbard was able to demonstrate good ability to assist pt with transfers from toilet and bed as well as ability to guard during ambulation. Pt with better mobility today. Continues to require elevated surface for transfers from sit<> stand but has improved with ability to power up with mod A. Pt continues to demonstrate significant forward lean when standing and ambulating. Pt affirms confidence with performing HEP. Pt would be a good candidate for HHPT to address deficits in mobility and strength to decrease caregiver burden and encourage return to PLOF. PT will continue to follow acutely.    Follow Up Recommendations  Home health PT;Supervision/Assistance - 24 hour     Equipment Recommendations   (gait belt. Pt declined need for w/c)    Recommendations for Other Services       Precautions / Restrictions Precautions Precautions: Fall;Knee Precaution Booklet Issued: Yes (comment) Precaution Comments: handout given Restrictions Weight Bearing Restrictions: No RLE Weight Bearing: Weight bearing as tolerated LLE Weight Bearing: Weight bearing as tolerated    Mobility  Bed Mobility Overal bed mobility: Needs Assistance Bed Mobility: Sit to Supine       Sit to supine: Min assist   General bed mobility comments: Min A for LE mobility into bed.  Sister assisted pt with getting back into bed.   Transfers Overall transfer level: Needs assistance Equipment  used: Rolling walker (2 wheeled) Transfers: Sit to/from Stand Sit to Stand: Mod assist         General transfer comment: Mod A to power to stand. Transferred from elevated surface (chair x1, toilet riser x1, and bed x1). Sister able to demonstrate good ability to assist with transfers with gait belt.  Sister aware of where to purchase gait belt as well.   Ambulation/Gait Ambulation/Gait assistance: Min assist Ambulation Distance (Feet): 20 Feet Assistive device: Rolling walker (2 wheeled) Gait Pattern/deviations: Step-to pattern;Decreased stride length;Antalgic;Trunk flexed Gait velocity: decreased Gait velocity interpretation: Below normal speed for age/gender General Gait Details: Cues for trunk upright throughout and weightbearing through arms. Min A to maintain balance as pt is at risk for knee collapse/fall due to pain in B knees   Stairs            Wheelchair Mobility    Modified Rankin (Stroke Patients Only)       Balance Overall balance assessment: Needs assistance Sitting-balance support: Single extremity supported;Feet supported Sitting balance-Leahy Scale: Fair   Postural control: Posterior lean Standing balance support: Bilateral upper extremity supported;During functional activity Standing balance-Leahy Scale: Poor Standing balance comment: Requires BUE to maintain static standing balance                    Cognition Arousal/Alertness: Awake/alert Behavior During Therapy: WFL for tasks assessed/performed Overall Cognitive Status: Within Functional Limits for tasks assessed                      Exercises      General  Comments        Pertinent Vitals/Pain Pain Assessment: Faces Faces Pain Scale: Hurts even more Pain Location: B knees Pain Descriptors / Indicators: Aching;Grimacing;Guarding;Sore Pain Intervention(s): Limited activity within patient's tolerance;Monitored during session;Repositioned    Home Living Family/patient  expects to be discharged to:: (P) Private residence Living Arrangements: (P) Children;Other relatives;Other (Comment) (sister and teenage daughter) Available Help at Discharge: (P) Family;Available 24 hours/day;Neighbor Type of Home: (P) Apartment Home Access: (P) Level entry   Home Layout: (P) One level Home Equipment: (P) Bedside commode;Walker - 2 wheels;Other (comment);Hand held shower head;Hospital bed;Shower seat Additional Comments: (P) Pt reports that she has a shower seat, not a tub bench.    Prior Function Level of Independence: (P) Independent with assistive device(s)      Comments: (P) used walking stick and walker   PT Goals (current goals can now be found in the care plan section) Acute Rehab PT Goals Patient Stated Goal: to go home PT Goal Formulation: With patient Time For Goal Achievement: 02/15/16 Potential to Achieve Goals: Fair Progress towards PT goals: Progressing toward goals    Frequency    7X/week      PT Plan Discharge plan needs to be updated    Co-evaluation             End of Session Equipment Utilized During Treatment: Gait belt Activity Tolerance: Patient limited by pain Patient left: with call bell/phone within reach;in chair     Time: 5784-6962 PT Time Calculation (min) (ACUTE ONLY): 32 min  Charges:  $Self Care/Home Management: 2022-10-12                    G Codes:      Gaye Pollack March 02, 2016, 2:01 PM Gaye Pollack, SPT [phone] I have read, reviewed and agree with student's note.   [facility] Acute Rehabilitation [phone] (pager)

## 2016-02-02 NOTE — Progress Notes (Signed)
Physical Therapy Treatment Patient Details Name: Kristin Hubbard MRN: [id number] DOB: 09-08-1960 Today's Date: 02/02/2016    History of Present Illness Pt is a 56 y/o female who underwent B TKA on 01/31/2016. She had windswept knees with the right leg being valgus in the left knee being varus. She had hypertrophic osteophyte formation posteriorly and diffusely, with severe restriction in motion. PMH of thyroid disease, HTN, and anxiety    PT Comments    Pt with improved bed mobility and transfers today. Pt required fewer attempts to transfer OOB and demonstrated good push up with heavy Mod A to stand. Pt with better awareness of safety and was able to ambulate around room with min A to manage balance. Cues for upright posture and weight bearing through arms. Pt would benefit from SNF following d/c to continue to address deficits in mobility and activity tolerance, but pt is insistent that she will be able to manage with her sister. Therefore will recommend HHPT with 24 hr supervision. Sister to come in afternoon session to demonstrate ability to mobilize/guard. PT will continue to follow acutely.   Follow Up Recommendations  SNF (HHPT with 24 hr supervision if pt refuses)     Equipment Recommendations  Other (comment) (Gait belt)    Recommendations for Other Services       Precautions / Restrictions Precautions Precautions: Fall;Knee Precaution Booklet Issued: Yes (comment) Precaution Comments: handout given Restrictions Weight Bearing Restrictions: No RLE Weight Bearing: Weight bearing as tolerated LLE Weight Bearing: Weight bearing as tolerated    Mobility  Bed Mobility Overal bed mobility: Needs Assistance Bed Mobility: Sit to Supine     Supine to sit: Min assist Sit to supine: Min assist   General bed mobility comments: Min A for LE mobility out of bed  Transfers Overall transfer level: Needs assistance Equipment used: Rolling walker (2 wheeled) Transfers: Sit to/from  Stand Sit to Stand: Mod assist         General transfer comment: Heavy mod A to transfer sit<>stand. Required x2 attempts to stand successfully. Pt will stand after she readies herself mentally (pt does not warn that she is about to stand- standby in position to assist as needed). Bed elevated to allow for easy transfer  Ambulation/Gait Ambulation/Gait assistance: Min assist Ambulation Distance (Feet): 15 Feet Assistive device: Rolling walker (2 wheeled) Gait Pattern/deviations: Step-to pattern;Decreased stride length;Antalgic;Trunk flexed Gait velocity: decreased Gait velocity interpretation: Below normal speed for age/gender General Gait Details: Cues for trunk upright throughout and weightbearing through arms. Min A to maintain balance as pt is at risk for knee collapse/fall due to pain in B knees   Stairs            Wheelchair Mobility    Modified Rankin (Stroke Patients Only)       Balance Overall balance assessment: Needs assistance Sitting-balance support: Single extremity supported;Feet supported Sitting balance-Leahy Scale: Fair   Postural control: Posterior lean Standing balance support: Bilateral upper extremity supported;During functional activity Standing balance-Leahy Scale: Poor Standing balance comment: Requires BUE to maintain static standing balance                    Cognition Arousal/Alertness: Awake/alert Behavior During Therapy: WFL for tasks assessed/performed Overall Cognitive Status: Within Functional Limits for tasks assessed                      Exercises Total Joint Exercises Ankle Circles/Pumps: AROM;Both;10 reps;Supine Quad Sets: AROM;10 reps;Both;Supine Towel Squeeze: AROM;Both;10 reps;Supine  Heel Slides: AROM;10 reps;Both;Seated Hip ABduction/ADduction: AROM;Both;10 reps;Supine Goniometric ROM: R 30 to 70; L 20 to 70    General Comments        Pertinent Vitals/Pain Pain Assessment: 0-10 Pain Score: 8  Pain  Location: B knees Pain Descriptors / Indicators: Aching;Grimacing;Guarding;Sore Pain Intervention(s): Limited activity within patient's tolerance;Monitored during session;Repositioned;Premedicated before session    Home Living                      Prior Function            PT Goals (current goals can now be found in the care plan section) Acute Rehab PT Goals Patient Stated Goal: to go home PT Goal Formulation: With patient Time For Goal Achievement: 02/15/16 Potential to Achieve Goals: Fair Progress towards PT goals: Progressing toward goals    Frequency    7X/week      PT Plan Discharge plan needs to be updated    Co-evaluation             End of Session Equipment Utilized During Treatment: Gait belt Activity Tolerance: Patient limited by fatigue;Patient limited by pain Patient left: with call bell/phone within reach;in chair     Time: [id number] PT Time Calculation (min) (ACUTE ONLY): 44 min  Charges:  $Gait Training: 8-22 mins $Therapeutic Exercise: 8-22 mins $Therapeutic Activity: 8-22 mins                    G Codes:      Kristin PollackRebecca Hubbard 02/02/2016, 11:24 AM Kristin Pollackebecca Hubbard, SPT [phone]  I have read, reviewed and agree with student's note.   [facility] Medical CenterDawn Brynda Hubbard,PT Acute Rehabilitation [phone])

## 2016-02-02 NOTE — Discharge Summary (Addendum)
Physician Discharge Summary  Patient ID: Kristin Hubbard MRN: [id number] DOB/AGE: Jul 04, 1960 56 y.o.  Admit date: 01/31/2016 Discharge date: 02/03/2016  Admission Diagnoses:  Bilateral primary osteoarthritis of knee  Discharge Diagnoses:  Principal Problem:   Bilateral primary osteoarthritis of knee Active Problems:   Morbid obesity (HCC)   S/P total knee arthroplasty, bilateral Acute blood loss anemia, asymptomatic  Past Medical History:  Diagnosis Date  . Anxiety   . Bilateral primary osteoarthritis of knee 01/31/2016  . Complication of anesthesia    after 1 st surgery was combative,other surgeries have been fine  . Hypertension   . Hypothyroidism   . Migraine   . Thyroid disease    Hypothyroidism    Surgeries: Procedure(s): TOTAL KNEE BILATERAL on 01/31/2016   Consultants (if any):   Discharged Condition: Improved  Hospital Course: Kristin AzureRobin Renshaw is an 56 y.o. female who was admitted 01/31/2016 with a diagnosis of Bilateral primary osteoarthritis of knee and went to the operating room on 01/31/2016 and underwent the above named procedures.    She was given perioperative antibiotics:  Anti-infectives    Start     Dose/Rate Route Frequency Ordered Stop   01/31/16 1630  ceFAZolin (ANCEF) IVPB 2g/100 mL premix     2 g 200 mL/hr over 30 Minutes Intravenous Every 6 hours 01/31/16 1528 01/31/16 2204   01/31/16 0700  ceFAZolin (ANCEF) 3 g in dextrose 5 % 50 mL IVPB     3 g 130 mL/hr over 30 Minutes Intravenous To [facility] 01/30/16 1442 01/31/16 1044    .  She was given sequential compression devices, early ambulation, and xarelto for DVT prophylaxis.  She benefited maximally from the hospital stay and there were no complications.    Recent vital signs:  Vitals:   02/01/16 2111 02/02/16 0610  BP: (!) 124/58 (!) 165/60  Pulse: (!) 109 89  Resp: 17 17  Temp: 98.2 F (36.8 C) 98.1 F (36.7 C)    Recent laboratory studies:  Lab Results  Component Value Date    HGB 9.3 (L) 02/02/2016   HGB 9.6 (L) 02/01/2016   HGB 14.1 01/19/2016   Lab Results  Component Value Date   WBC 11.9 (H) 02/02/2016   PLT 155 02/02/2016   No results found for: INR Lab Results  Component Value Date   NA 134 (L) 02/01/2016   K 4.3 02/01/2016   CL 105 02/01/2016   CO2 22 02/01/2016   BUN 18 02/01/2016   CREATININE 0.78 02/01/2016   GLUCOSE 160 (H) 02/01/2016    Discharge Medications:   Allergies as of 02/02/2016      Reactions   Lisinopril Cough      Medication List    STOP taking these medications   acetaminophen 650 MG CR tablet Commonly known as:  TYLENOL   ibuprofen 200 MG tablet Commonly known as:  ADVIL,MOTRIN     TAKE these medications   amLODipine 5 MG tablet Commonly known as:  NORVASC TAKE ONE TABLET BY MOUTH ONCE DAILY What changed:  See the new instructions.   baclofen 10 MG tablet Commonly known as:  LIORESAL Take 1 tablet (10 mg total) by mouth 3 (three) times daily. As needed for muscle spasm   diazepam 5 MG tablet Commonly known as:  VALIUM Take 1 tablet (5 mg total) by mouth every 6 (six) hours as needed for anxiety.   hydrochlorothiazide 25 MG tablet Commonly known as:  HYDRODIURIL Take 1 tablet (25 mg total) by mouth daily. 1  po qd prn   HYDROmorphone 2 MG tablet Commonly known as:  DILAUDID Take 1 tablet (2 mg total) by mouth every 4 (four) hours as needed for severe pain.   levothyroxine 150 MCG tablet Commonly known as:  SYNTHROID, LEVOTHROID TAKE ONE TABLET BY MOUTH ONCE DAILY. PT NEED AN APPOINTMENT FOR FURTHER REFILLS What changed:  See the new instructions.   nystatin cream Commonly known as:  MYCOSTATIN Apply 1 application topically 2 (two) times daily. What changed:  when to take this  reasons to take this   ondansetron 4 MG tablet Commonly known as:  ZOFRAN Take 1 tablet (4 mg total) by mouth every 8 (eight) hours as needed for nausea or vomiting.   oxyCODONE-acetaminophen 10-325 MG  tablet Commonly known as:  PERCOCET Take 1 tablet by mouth every 8 (eight) hours as needed for pain. What changed:  Another medication with the same name was removed. Continue taking this medication, and follow the directions you see here.   rivaroxaban 10 MG Tabs tablet Commonly known as:  XARELTO Take 1 tablet (10 mg total) by mouth daily.   sennosides-docusate sodium 8.6-50 MG tablet Commonly known as:  SENOKOT-S Take 2 tablets by mouth daily.   simvastatin 40 MG tablet Commonly known as:  ZOCOR Take 1 tablet (40 mg total) by mouth at bedtime.            Durable Medical Equipment        Start     Ordered   01/31/16 1529  DME Walker rolling  Once    Question:  Patient needs a walker to treat with the following condition  Answer:  S/p total knee replacement, bilateral   01/31/16 1528   01/31/16 1529  DME 3 n 1  Once     01/31/16 1528   01/31/16 1529  DME Bedside commode  Once    Question:  Patient needs a bedside commode to treat with the following condition  Answer:  S/P total knee replacement using cement, bilateral   01/31/16 1528   01/31/16 1529  For home use only DME Continuous passive motion machine  Once     01/31/16 1528      Diagnostic Studies: Dg Knee Left Port  Result Date: 01/31/2016 CLINICAL DATA:  56 y/o  F; total knee arthroplasty. EXAM: PORTABLE LEFT KNEE - 1-2 VIEW COMPARISON:  None. FINDINGS: Total knee arthroplasty and patellar resurfacing with prosthesis. No acute fracture or hardware related complication is identified. 6 mm bony density at the medial margin of the tibial prosthesis probably represents an intra-articular body or fragment. Expected postsurgical changes with joint effusion with air and edema and air in the surrounding soft tissues. IMPRESSION: Total knee arthroplasty with expected postsurgical changes. Electronically Signed   By: Mitzi Hansen M.D.   On: 01/31/2016 14:28   Dg Knee Right Port  Result Date: 01/31/2016 CLINICAL  DATA:  56 y/o  F; bilateral total knee arthroplasty. EXAM: PORTABLE RIGHT KNEE - 1-2 VIEW COMPARISON:  None. FINDINGS: Right-sided total knee arthroplasty and patellar resurfacing with prosthesis. No acute fracture is identified. No apparent hardware related complication. Expected postsurgical changes with small joint effusion and air and edema/air in the soft tissues. IMPRESSION: Right-sided total knee arthroplasty with expected postsurgical changes. Electronically Signed   By: Mitzi Hansen M.D.   On: 01/31/2016 14:27    Disposition: 01-Home or Self Care    Follow-up Information    Eulas Post, MD. Schedule an appointment as soon as possible for  a visit in 2 week(s).   Specialty:  Orthopedic Surgery Contact information: [address] [place] [address] [phone]        [facility] Follow up.   Specialty:  Home Health Services Why:  Someone from [facility] will contact you to arrange start date and time for therapy. Contact information: [address] [place] [address] [place] [address] [phone]            Signed: Eulas Post 02/02/2016, 3:12 PM

## 2016-02-02 NOTE — Evaluation (Addendum)
Occupational Therapy Evaluation Patient Details Name: Kristin Hubbard MRN: [id number] DOB: 09/20/1960 Today's Date: 02/02/2016    History of Present Illness Pt is a 56 y/o female who underwent B TKA on 01/31/2016. She had windswept knees with the right leg being valgus in the left knee being varus. She had hypertrophic osteophyte formation posteriorly and diffusely, with severe restriction in motion. PMH of thyroid disease, HTN, and anxiety   Clinical Impression   Pt admitted with the above diagnoses and presents with below problem list. Pt will benefit from continued OT to address the below listed deficits and maximize independence with basic ADLs prior to d/c to venue bellow. PTA pt was independent with ADLs. Pt is currently mod to max A with LB ADLs and functional transfers, min A for in-room functional mobility. +2 (safety/equipment) assist utilized to stabilize rw during transfers as pt currently powering up to standing with both hands on rw.  Sister present and included in session and hands-on practice with pt including body mechanics.      Follow Up Recommendations  Supervision/Assistance - 24 hour;Home health OT;Other (comment) (Pt declining SNF so recommend HHOT.)    Equipment Recommendations  Tub/shower bench;Other (comment) (bariatric? (313lbs))    Recommendations for Other Services       Precautions / Restrictions Precautions Precautions: Fall;Knee Precaution Booklet Issued: Yes (comment) Precaution Comments: handout given Restrictions Weight Bearing Restrictions: No RLE Weight Bearing: Weight bearing as tolerated LLE Weight Bearing: Weight bearing as tolerated      Mobility Bed Mobility Overal bed mobility: Needs Assistance Bed Mobility: Sit to Supine       Sit to supine: Min assist   General bed mobility comments: up in chair  Transfers Overall transfer level: Needs assistance Equipment used: Rolling walker (2 wheeled) Transfers: Sit to/from Stand Sit to  Stand: Mod assist;+2 safety/equipment         General transfer comment: Heavy mod A from recliner. Mod A from EOB and to [facility]. Pt using BUE support on rw to pull up to standing, +2 assist to steady rw. Sister completing sit>stand from EOB and ambulated with pt to bathroom.     Balance Overall balance assessment: Needs assistance Sitting-balance support: Single extremity supported;Feet supported Sitting balance-Leahy Scale: Fair   Postural control: Posterior lean Standing balance support: Bilateral upper extremity supported;During functional activity Standing balance-Leahy Scale: Poor Standing balance comment: Requires BUE to maintain static standing balance                            ADL Overall ADL's : Needs assistance/impaired Eating/Feeding: Set up;Sitting   Grooming: Set up;Sitting   Upper Body Bathing: Set up;Sitting   Lower Body Bathing: +2 for safety/equipment;Sit to/from stand;Maximal assistance   Upper Body Dressing : Set up;Sitting   Lower Body Dressing: Maximal assistance;+2 for safety/equipment;Sit to/from stand   Toilet Transfer: Moderate assistance;+2 for safety/equipment;Ambulation;RW;Requires wide/bariatric   Toileting- Clothing Manipulation and Hygiene: Maximal assistance;+2 for safety/equipment;Sit to/from stand   Tub/ Shower Transfer: Tub transfer;Maximal assistance;+2 for safety/equipment;Tub bench;Ambulation;Rolling walker   Functional mobility during ADLs: Moderate assistance;+2 for safety/equipment;Rolling walker General ADL Comments: Pt completed functional mobility from recliner to 3n1 over toilet in bathroom. Sister present and included in education. +2 for safety as pt using BUE on rw to pull up to standing. Education to sister for body mechanics of assisting with transfers.      Vision     Perception     Praxis  Pertinent Vitals/Pain Pain Assessment: Faces Faces Pain Scale: Hurts even more Pain Location: B knees Pain  Descriptors / Indicators: Aching;Grimacing;Guarding;Sore Pain Intervention(s): Limited activity within patient's tolerance;Monitored during session;Premedicated before session;Repositioned     Hand Dominance Right   Extremity/Trunk Assessment Upper Extremity Assessment Upper Extremity Assessment: Overall WFL for tasks assessed;Generalized weakness   Lower Extremity Assessment Lower Extremity Assessment: Defer to PT evaluation       Communication Communication Communication: No difficulties   Cognition Arousal/Alertness: Awake/alert Behavior During Therapy: WFL for tasks assessed/performed Overall Cognitive Status: Within Functional Limits for tasks assessed                     General Comments       Exercises       Shoulder Instructions      Home Living Family/patient expects to be discharged to:: Private residence Living Arrangements: Children;Other relatives;Other (Comment) (sister and teenage daughter) Available Help at Discharge: Family;Available 24 hours/day;Neighbor Type of Home: Apartment Home Access: Level entry     Home Layout: One level     Bathroom Shower/Tub: Chief Strategy OfficerTub/shower unit   Bathroom Toilet: Standard     Home Equipment: Bedside commode;Walker - 2 wheels;Other (comment);Hand held shower head;Hospital bed;Shower seat   Additional Comments: Pt reports that she has a shower seat, not a tub bench.      Prior Functioning/Environment Level of Independence: Independent with assistive device(s)        Comments: used walking stick and walker        OT Problem List: Impaired balance (sitting and/or standing);Decreased knowledge of use of DME or AE;Decreased knowledge of precautions;Pain;Obesity   OT Treatment/Interventions: Self-care/ADL training;DME and/or AE instruction;Energy conservation;Therapeutic activities;Patient/family education;Balance training    OT Goals(Current goals can be found in the care plan section) Acute Rehab OT  Goals Patient Stated Goal: to go home OT Goal Formulation: With patient/family Time For Goal Achievement: 02/09/16 Potential to Achieve Goals: Good ADL Goals Pt Will Perform Lower Body Bathing: with min assist;with adaptive equipment;sit to/from stand Pt Will Perform Lower Body Dressing: with min assist;with adaptive equipment;sit to/from stand Pt Will Transfer to Toilet: with min assist;ambulating Pt Will Perform Toileting - Clothing Manipulation and hygiene: with min assist;sit to/from stand Pt Will Perform Tub/Shower Transfer: ambulating;tub bench;3 in 1;rolling walker;with min assist Additional ADL Goal #1: Pt will complete bed mobility at min guard level to prepare for OOB ADLs.   OT Frequency: Min 2X/week   Barriers to D/C: Other (comment)  +1 assist during the day while teenage daughter is at school.       Co-evaluation PT/OT/SLP Co-Evaluation/Treatment: Yes Reason for Co-Treatment: Complexity of the patient's impairments (multi-system involvement);For patient/therapist safety ((PT joined OT for last part of session)   OT goals addressed during session: ADL's and self-care      End of Session Equipment Utilized During Treatment: Gait belt;Rolling walker CPM Right Knee CPM Right Knee: On Right Knee Flexion (Degrees): 70  Activity Tolerance: Patient tolerated treatment well Patient left: Other (comment);with family/visitor present (on 3n1 over toilet with PT present)   Time: 1132-1210 OT Time Calculation (min): 38 min Charges:  OT General Charges $OT Visit: 1 Procedure OT Evaluation $OT Eval Low Complexity: 1 Procedure OT Treatments $Self Care/Home Management : 8-22 mins G-Codes:    Pilar GrammesMathews, Karilyn Wind H 02/02/2016, 3:13 PM

## 2016-02-02 NOTE — Progress Notes (Signed)
Orthopedic Tech Progress Note Patient Details:  Kristin AzureRobin Hubbard 10-13-1960 [id number]  CPM Left Knee CPM Left Knee: On Left Knee Flexion (Degrees): 65 Left Knee Extension (Degrees): 0 CPM Right Knee CPM Right Knee: On Right Knee Flexion (Degrees): 70 Right Knee Extension (Degrees): 0 Additional Comments: pt wants to alternate knees  CPM put on Right knee at 0-70.  Kristin Hubbard, Kristin Hubbard 02/02/2016, 12:39 PM

## 2016-02-02 NOTE — Care Management Note (Addendum)
Case Management Note  Patient Details  Name: Kristin Hubbard MRN: [id number] Date of Birth: [birth year]  Subjective/Objective:  56 yr old female s/p bilateral knees arthroplasty.                   Action/Plan: Case manager spoke with patient concerning discharge needs. Patient has [employer] insurance, case manager has contacted them to locate Home Health Agency in network. Referral has been called to Ayesha RumpfMary Yonjof, [facility]. Patient states she has rolling walker, 3in1, and shower chair. Patient states she would like CPM and will discuss with Dr. Dion SaucierLandau.She only wants them if they are covered by insurance.  1600: Patient's insurance covers CPM, Dr. Dion SaucierLandau entered orders on admission. CM has spoken with [employer] to arrange CPM's for home.  Expected Discharge Date:   02/02/16               Expected Discharge Plan:  Home w Home Health Services  In-House Referral:  NA  Discharge planning Services  CM Consult  Post Acute Care Choice:  Home Health Choice offered to:  Patient  DME Arranged:  N/A DME Agency:  NA  HH Arranged:  PT HH Agency:  [facility]  Status of Service:  Completed, signed off  If discussed at [employer] Meetings, dates discussed:    Additional Comments:  [name], [name], RN 02/02/2016, 10:17 AM

## 2016-02-02 NOTE — Progress Notes (Signed)
     Subjective:  Patient reports pain as mild.  She has gotten up and walk with physical therapy, although is not sure that she is quite independent enough for discharge home, but is overall making great progress.  Objective:   VITALS:   Vitals:   01/31/16 2046 02/01/16 0647 02/01/16 1536 02/01/16 2111  BP: 121/60 125/62 (!) 145/69 (!) 124/58  Pulse: 98 94 (!) 105 (!) 109  Resp: 16 16 18 17   Temp: 98 F (36.7 C) 98 F (36.7 C) 98 F (36.7 C) 98.2 F (36.8 C)  TempSrc: Axillary Oral Oral Oral  SpO2: 97% 98% 98% 98%  Weight:      Height:        Neurologically intact Sensation intact distally Dorsiflexion/Plantar flexion intact Incision: dressing C/D/I   Lab Results  Component Value Date   WBC 10.0 02/01/2016   HGB 9.6 (L) 02/01/2016   HCT 29.0 (L) 02/01/2016   MCV 93.5 02/01/2016   PLT 142 (L) 02/01/2016   BMET    Component Value Date/Time   NA 134 (L) 02/01/2016 0542   K 4.3 02/01/2016 0542   CL 105 02/01/2016 0542   CO2 22 02/01/2016 0542   GLUCOSE 160 (H) 02/01/2016 0542   GLUCOSE 111 (H) 11/08/2005 1303   BUN 18 02/01/2016 0542   CREATININE 0.78 02/01/2016 0542   CREATININE 0.62 04/21/2010 1524   CALCIUM 8.2 (L) 02/01/2016 0542   GFRNONAA >60 02/01/2016 0542   GFRAA >60 02/01/2016 0542     Assessment/Plan: 2 Days Post-Op   Principal Problem:   Bilateral primary osteoarthritis of knee Active Problems:   Morbid obesity (HCC)   S/P total knee arthroplasty, bilateral   Advance diet Up with therapy Discharge home with home health Discharge maybe later this afternoon if she passes physical therapy, or possibly tomorrow. Plan for [facility] at home, case manager to arrange home health PT.   Cherelle Midkiff P 02/02/2016, 6:08 AM   Teryl LucyJoshua Cynara Tatham, MD Cell [phone]

## 2016-02-03 LAB — CBC
HCT: 28 % — ABNORMAL LOW (ref 36.0–46.0)
Hemoglobin: 9.3 g/dL — ABNORMAL LOW (ref 12.0–15.0)
MCH: 31.3 pg (ref 26.0–34.0)
MCHC: 33.2 g/dL (ref 30.0–36.0)
MCV: 94.3 fL (ref 78.0–100.0)
Platelets: 145 10*3/uL — ABNORMAL LOW (ref 150–400)
RBC: 2.97 MIL/uL — ABNORMAL LOW (ref 3.87–5.11)
RDW: 13.6 % (ref 11.5–15.5)
WBC: 7.2 10*3/uL (ref 4.0–10.5)

## 2016-02-03 NOTE — Progress Notes (Signed)
Physical Therapy Treatment Patient Details Name: Kristin Hubbard MRN: [id number] DOB: 09-26-1960 Today's Date: 02/03/2016    History of Present Illness Pt is a 56 y/o female who underwent B TKA on 01/31/2016. She had windswept knees with the right leg being valgus in the left knee being varus. She had hypertrophic osteophyte formation posteriorly and diffusely, with severe restriction in motion. PMH of thyroid disease, HTN, and anxiety    PT Comments    Pt making steady progress towards goals. Pt able to transfer sit<>stand with min A from elevated bed and mod A from raised toilet. Pt continues to require assist to power to stand. Pt able to toilet independently and requires assist only to sit and stand from toilet. Pt reports confidence with transferring and mobilizing about home; however, pt's sister, Bjorn Loser, reports concern for her own fatigue as she works the night shift. Bjorn Loser notes that "they will work it out." Educated on car transfers as pt likely to go home soon. [facility] in regards to gait belt price as pt was planning on buying a gait belt there rather than at the gift shop. Pt notes they will be going to buy belt today. Pt very anxious to go home and was somewhat impatient with therapy today.  Pt just not very happy in general today as she "Just wants to go home as soon as possible."  Tried to meet her needs as best as possible.  Pt continues to be an appropriate candidate for HHPT to address deficits in activity tolerance and mobility to allow decreased caregiver burden and return to PLOF. PT will continue to follow acutely.   Follow Up Recommendations  Home health PT;Supervision/Assistance - 24 hour     Equipment Recommendations   (gait belt)    Recommendations for Other Services       Precautions / Restrictions Precautions Precautions: Fall;Knee Precaution Booklet Issued: Yes (comment) Precaution Comments: handout given Restrictions Weight Bearing  Restrictions: No RLE Weight Bearing: Weight bearing as tolerated LLE Weight Bearing: Weight bearing as tolerated    Mobility  Bed Mobility Overal bed mobility: Needs Assistance Bed Mobility: Sit to Supine     Supine to sit: Min assist     General bed mobility comments: Min A for LE mobility. Good ability to scoot to EOB  Transfers Overall transfer level: Needs assistance Equipment used: Rolling walker (2 wheeled) Transfers: Sit to/from Stand Sit to Stand: Min assist;From elevated surface;Mod assist         General transfer comment: Min A from elevated bed to power to stand. Mod A from raised toilet to stand. Use of 1 hand on RW and one hand on rail. Requires assist to steady walker.   Ambulation/Gait Ambulation/Gait assistance: Min assist Ambulation Distance (Feet): 10 Feet Assistive device: Rolling walker (2 wheeled) Gait Pattern/deviations: Step-to pattern;Decreased stride length;Antalgic;Trunk flexed Gait velocity: decreased Gait velocity interpretation: Below normal speed for age/gender General Gait Details: Cues for trunk upright. Min A to maintain balance and safety   Stairs            Wheelchair Mobility    Modified Rankin (Stroke Patients Only)       Balance Overall balance assessment: Needs assistance Sitting-balance support: Single extremity supported Sitting balance-Leahy Scale: Fair   Postural control: Posterior lean Standing balance support: Bilateral upper extremity supported Standing balance-Leahy Scale: Poor Standing balance comment: Requires BUE to maintain static standing balance  Cognition Arousal/Alertness: Awake/alert Behavior During Therapy: WFL for tasks assessed/performed Overall Cognitive Status: Within Functional Limits for tasks assessed                      Exercises      General Comments        Pertinent Vitals/Pain Pain Assessment: Faces Faces Pain Scale: Hurts little more Pain  Location: B knees Pain Descriptors / Indicators: Aching;Grimacing;Guarding;Sore Pain Intervention(s): Limited activity within patient's tolerance;Monitored during session;Repositioned;Premedicated before session    Home Living                      Prior Function            PT Goals (current goals can now be found in the care plan section) Acute Rehab PT Goals Patient Stated Goal: to go home PT Goal Formulation: With patient Time For Goal Achievement: 02/15/16 Potential to Achieve Goals: Fair Progress towards PT goals: Progressing toward goals    Frequency    7X/week      PT Plan Current plan remains appropriate    Co-evaluation             End of Session Equipment Utilized During Treatment: Gait belt Activity Tolerance: Patient limited by pain Patient left: with call bell/phone within reach;in bed     Time: [id number] PT Time Calculation (min) (ACUTE ONLY): 28 min  Charges:  $Therapeutic Activity: 8-22 mins                    G Codes:      Gaye PollackRebecca Kim 02/03/2016, 10:13 AM Gaye Pollackebecca Kim, SPT [phone]  I have read, reviewed and agree with student's note.   [facility] Pl Surgery CenterDawn Orhan Mayorga,PT Acute Rehabilitation [phone] (pager)

## 2016-02-03 NOTE — Progress Notes (Signed)
Patient ID: Kristin Hubbard, female   DOB: 05/04/1960, 56 y.o.   MRN: [id number]     Subjective:  Patient reports pain as mild.  Patient in bed and in no acute distress.  Denies any CP or SOB  Objective:   VITALS:   Vitals:   02/01/16 2111 02/02/16 0610 02/02/16 2225 02/03/16 0424  BP: (!) 124/58 (!) 165/60 (!) 132/59 122/62  Pulse: (!) 109 89 87 84  Resp: 17 17 17    Temp: 98.2 F (36.8 C) 98.1 F (36.7 C) 98 F (36.7 C) 98.2 F (36.8 C)  TempSrc: Oral Oral Oral Oral  SpO2: 98% 98% 99% 99%  Weight:      Height:        ABD soft Sensation intact distally Dorsiflexion/Plantar flexion intact Incision: dressing C/D/I and no drainage   Lab Results  Component Value Date   WBC 7.2 02/03/2016   HGB 9.3 (L) 02/03/2016   HCT 28.0 (L) 02/03/2016   MCV 94.3 02/03/2016   PLT 145 (L) 02/03/2016   BMET    Component Value Date/Time   NA 134 (L) 02/01/2016 0542   K 4.3 02/01/2016 0542   CL 105 02/01/2016 0542   CO2 22 02/01/2016 0542   GLUCOSE 160 (H) 02/01/2016 0542   GLUCOSE 111 (H) 11/08/2005 1303   BUN 18 02/01/2016 0542   CREATININE 0.78 02/01/2016 0542   CREATININE 0.62 04/21/2010 1524   CALCIUM 8.2 (L) 02/01/2016 0542   GFRNONAA >60 02/01/2016 0542   GFRAA >60 02/01/2016 0542     Assessment/Plan: 3 Days Post-Op   Principal Problem:   Bilateral primary osteoarthritis of knee Active Problems:   Morbid obesity (HCC)   S/P total knee arthroplasty, bilateral   Advance diet Up with therapy Discharge home with home health    Haskel KhanDOUGLAS Marcel Sorter 02/03/2016, 9:26 AM   Teryl LucyJoshua Landau, MD Cell [phone]

## 2016-02-03 NOTE — Discharge Instructions (Signed)
Total Knee Replacement, Care After Refer to this sheet in the next few weeks. These instructions provide you with information about caring for yourself after your procedure. Your health care provider may also give you more specific instructions. Your treatment has been planned according to current medical practices, but problems sometimes occur. Call your health care provider if you have any problems or questions after your procedure. What can I expect after the procedure? After the procedure, it is common to have:  Pain and swelling.  A small amount of blood or clear fluid coming from your incision.  Limited range of motion. Follow these instructions at home: Medicines  Take over-the-counter and prescription medicines only as told by your health care provider.  If you were prescribed an antibiotic medicine, take it as told by your health care provider. Do not stop taking the antibiotic even if you start to feel better.  If you were prescribed a blood thinner (anticoagulant), take it as told by your health care provider. If you have a splint or brace:  Wear the immobilizer as told by your health care provider. Remove it only as told by your health care provider.  Loosen the immobilizer if your toes tingle, become numb, or turn cold and blue.  Do not let your immobilizer get wet if it is not waterproof.  Keep the immobilizer clean. Bathing  Do not take baths, swim, or use a hot tub until your health care provider approves. Ask your health care provider if you can take showers. You may only be allowed to take sponge baths for bathing.  If you have an immobilizer that is not waterproof, cover it with a watertight covering when you take a bath or shower.  Keep your bandage (dressing) dry until your health care provider says it can be removed. Incision care and drain care  Check your incision area and drain site every day for signs of infection. Check for:  More redness, swelling, or  pain.  More fluid or blood.  Warmth.  Pus or a bad smell.  Follow instructions from your health care provider about how to take care of your incision. Make sure you:  Wash your hands with soap and water before you change your dressing. If soap and water are not available, use hand sanitizer.  Change your dressing as told by your health care provider.  Leave stitches (sutures), skin glue, or adhesive strips in place. These skin closures may need to stay in place for 2 weeks or longer. If adhesive strip edges start to loosen and curl up, you may trim the loose edges. Do not remove adhesive strips completely unless your health care provider tells you to do that.  If you have a drain, follow instructions from your health care provider about caring for it. Do not remove the drain tube or any dressings around the tube opening unless your health care provider approves. Managing pain, stiffness, and swelling  If directed, put ice on your knee.  Put ice in a plastic bag.  Place a towel between your skin and the bag.  Leave the ice on for 20 minutes, 2-3 times per day.  If directed, apply heat to the affected area as often as told by your health care provider. Use the heat source that your health care provider recommends, such as a moist heat pack or a heating pad.  Place a towel between your skin and the heat source.  Leave the heat on for 20-30 minutes.  Remove the  heat if your skin turns bright red. This is especially important if you are unable to feel pain, heat, or cold. You may have a greater risk of getting burned.  Move your toes often to avoid stiffness and to lessen swelling.  Raise (elevate) your knee above the level of your heart while you are sitting or lying down.  Wear elastic knee support for as long as told by your health care provider. Driving  Do not drive until your health care provider approves. Ask your health care provider when it is safe to drive if you have  an immobilizer on your knee.  Do not drive or operate heavy machinery while taking prescription pain medicine.  Do not drive for 24 hours if you received a sedative. Activity  Do not lift anything that is heavier than 10 lb (4.5 kg) until your health care provider approves.  Do not play contact sports until your health care provider approves.  Avoid high-impact activities, including running, jumping rope, and jumping jacks.  Avoid sitting for a long time without moving. Get up and move around at least every few hours.  If physical therapy was prescribed, do exercises as told by your health care provider.  Return to your normal activities as told by your health care provider. Ask your health care provider what activities are safe for you. Safety  Do not use your leg to support your body weight until your health care provider approves. Use crutches or a walker as told by your health care provider. General instructions  Do not have any dental work done for at least 3 months after your surgery. When you do have dental work done, tell your dentist about your joint replacement.  Do not use any tobacco products, such as cigarettes, chewing tobacco, or e-cigarettes. If you need help quitting, ask your health care provider.  Wear compression stockings as told by your health care provider. These stockings help to prevent blood clots and reduce swelling in your legs.  If you have been sent home with a continuous passive motion machine, use it as told by your health care provider.  Drink enough fluid to keep your urine clear or pale yellow.  If you have been instructed to lose weight, follow instructions from your health care provider about how to do this safely.  Keep all follow-up visits as told by your health care provider. This is important. Contact a health care provider if:  You have more redness, swelling, or pain around your incision or drain.  You have more fluid or blood coming  from your incision or drain.  Your incision or drain site feels warm to the touch.  You have pus or a bad smell coming from your incision or drain.  You have a fever.  Your incision breaks open after your health care provider removes your sutures, skin glue, or adhesive tape.  Your prosthesis feels loose.  You have knee pain that does not go away. Get help right away if:  You have a rash.  You have pain or swelling in your calf or thigh.  You have shortness of breath or difficulty breathing.  You have chest pain.  Your range of motion in your knee is getting worse. This information is not intended to replace advice given to you by your health care provider. Make sure you discuss any questions you have with your health care provider. Document Released: 07/28/2004 Document Revised: 09/12/2015 Document Reviewed: 12/15/2014 Elsevier Interactive Patient Education  2017 [employer] Information on my medicine - XARELTO (Rivaroxaban)  This medication education was reviewed with me or my healthcare representative as part of my discharge preparation.  The pharmacist that spoke with me during my hospital stay was:  Herby Abraham, [facility]  Why was Xarelto prescribed for you? Xarelto was prescribed for you to reduce the risk of blood clots forming after orthopedic surgery. The medical term for these abnormal blood clots is venous thromboembolism (VTE).  What do you need to know about xarelto ? Take your Xarelto ONCE DAILY at the same time every day. You may take it either with or without food.  If you have difficulty swallowing the tablet whole, you may crush it and mix in applesauce just prior to taking your dose.  Take Xarelto exactly as prescribed by your doctor and DO NOT stop taking Xarelto without talking to the doctor who prescribed the medication.  Stopping without other VTE prevention medication to take the place of Xarelto may increase your risk of developing a  clot.  After discharge, you should have regular check-up appointments with your healthcare provider that is prescribing your Xarelto.    What do you do if you miss a dose? If you miss a dose, take it as soon as you remember on the same day then continue your regularly scheduled once daily regimen the next day. Do not take two doses of Xarelto on the same day.   Important Safety Information A possible side effect of Xarelto is bleeding. You should call your healthcare provider right away if you experience any of the following: ? Bleeding from an injury or your nose that does not stop. ? Unusual colored urine (red or dark brown) or unusual colored stools (red or black). ? Unusual bruising for unknown reasons. ? A serious fall or if you hit your head (even if there is no bleeding).  Some medicines may interact with Xarelto and might increase your risk of bleeding while on Xarelto. To help avoid this, consult your healthcare provider or pharmacist prior to using any new prescription or non-prescription medications, including herbals, vitamins, non-steroidal anti-inflammatory drugs (NSAIDs) and supplements.  This website has more information on Xarelto: VisitDestination.com.br.  INSTRUCTIONS AFTER JOINT REPLACEMENT   o Remove items at home which could result in a fall. This includes throw rugs or furniture in walking pathways o ICE to the affected joint every three hours while awake for 30 minutes at a time, for at least the first 3-5 days, and then as needed for pain and swelling.  Continue to use ice for pain and swelling. You may notice swelling that will progress down to the foot and ankle.  This is normal after surgery.  Elevate your leg when you are not up walking on it.   o Continue to use the breathing machine you got in the hospital (incentive spirometer) which will help keep your temperature down.  It is common for your temperature to cycle up and down following surgery, especially at night  when you are not up moving around and exerting yourself.  The breathing machine keeps your lungs expanded and your temperature down.   DIET:  As you were doing prior to hospitalization, we recommend a well-balanced diet.  DRESSING / WOUND CARE / SHOWERING  You may change your dressing 3-5 days after surgery.  Then change the dressing every day with sterile gauze.  Please use good hand washing techniques before changing the dressing.  Do not use any lotions or creams  on the incision until instructed by your surgeon.  ACTIVITY  o Increase activity slowly as tolerated, but follow the weight bearing instructions below.   o No driving for 6 weeks or until further direction given by your physician.  You cannot drive while taking narcotics.  o No lifting or carrying greater than 10 lbs. until further directed by your surgeon. o Avoid periods of inactivity such as sitting longer than an hour when not asleep. This helps prevent blood clots.  o You may return to work once you are authorized by your doctor.     WEIGHT BEARING   Weight bearing as tolerated with assist device (walker, cane, etc) as directed, use it as long as suggested by your surgeon or therapist, typically at least 4-6 weeks.   EXERCISES  Results after joint replacement surgery are often greatly improved when you follow the exercise, range of motion and muscle strengthening exercises prescribed by your doctor. Safety measures are also important to protect the joint from further injury. Any time any of these exercises cause you to have increased pain or swelling, decrease what you are doing until you are comfortable again and then slowly increase them. If you have problems or questions, call your caregiver or physical therapist for advice.   Rehabilitation is important following a joint replacement. After just a few days of immobilization, the muscles of the leg can become weakened and shrink (atrophy).  These exercises are designed  to build up the tone and strength of the thigh and leg muscles and to improve motion. Often times heat used for twenty to thirty minutes before working out will loosen up your tissues and help with improving the range of motion but do not use heat for the first two weeks following surgery (sometimes heat can increase post-operative swelling).   These exercises can be done on a training (exercise) mat, on the floor, on a table or on a bed. Use whatever works the best and is most comfortable for you.    Use music or television while you are exercising so that the exercises are a pleasant break in your day. This will make your life better with the exercises acting as a break in your routine that you can look forward to.   Perform all exercises about fifteen times, three times per day or as directed.  You should exercise both the operative leg and the other leg as well.  Exercises include:    Quad Sets - Tighten up the muscle on the front of the thigh (Quad) and hold for 5-10 seconds.    Straight Leg Raises - With your knee straight (if you were given a brace, keep it on), lift the leg to 60 degrees, hold for 3 seconds, and slowly lower the leg.  Perform this exercise against resistance later as your leg gets stronger.   Leg Slides: Lying on your back, slowly slide your foot toward your buttocks, bending your knee up off the floor (only go as far as is comfortable). Then slowly slide your foot back down until your leg is flat on the floor again.   Angel Wings: Lying on your back spread your legs to the side as far apart as you can without causing discomfort.   Hamstring Strength:  Lying on your back, push your heel against the floor with your leg straight by tightening up the muscles of your buttocks.  Repeat, but this time bend your knee to a comfortable angle, and push your heel against  the floor.  You may put a pillow under the heel to make it more comfortable if necessary.   A rehabilitation program  following joint replacement surgery can speed recovery and prevent re-injury in the future due to weakened muscles. Contact your doctor or a physical therapist for more information on knee rehabilitation.    CONSTIPATION  Constipation is defined medically as fewer than three stools per week and severe constipation as less than one stool per week.  Even if you have a regular bowel pattern at home, your normal regimen is likely to be disrupted due to multiple reasons following surgery.  Combination of anesthesia, postoperative narcotics, change in appetite and fluid intake all can affect your bowels.   YOU MUST use at least one of the following options; they are listed in order of increasing strength to get the job done.  They are all available over the counter, and you may need to use some, POSSIBLY even all of these options:    Drink plenty of fluids (prune juice may be helpful) and high fiber foods Colace 100 mg by mouth twice a day  Senokot for constipation as directed and as needed Dulcolax (bisacodyl), take with full glass of water  Miralax (polyethylene glycol) once or twice a day as needed.  If you have tried all these things and are unable to have a bowel movement in the first 3-4 days after surgery call either your surgeon or your primary doctor.    If you experience loose stools or diarrhea, hold the medications until you stool forms back up.  If your symptoms do not get better within 1 week or if they get worse, check with your doctor.  If you experience "the worst abdominal pain ever" or develop nausea or vomiting, please contact the office immediately for further recommendations for treatment.   ITCHING:  If you experience itching with your medications, try taking only a single pain pill, or even half a pain pill at a time.  You can also use Benadryl over the counter for itching or also to help with sleep.   TED HOSE STOCKINGS:  Use stockings on both legs until for at least 2 weeks  or as directed by physician office. They may be removed at night for sleeping.  MEDICATIONS:  See your medication summary on the After Visit Summary that nursing will review with you.  You may have some home medications which will be placed on hold until you complete the course of blood thinner medication.  It is important for you to complete the blood thinner medication as prescribed.  PRECAUTIONS:  If you experience chest pain or shortness of breath - call 911 immediately for transfer to the hospital emergency department.   If you develop a fever greater that 101 F, purulent drainage from wound, increased redness or drainage from wound, foul odor from the wound/dressing, or calf pain - CONTACT YOUR SURGEON.                                                   FOLLOW-UP APPOINTMENTS:  If you do not already have a post-op appointment, please call the office for an appointment to be seen by your surgeon.  Guidelines for how soon to be seen are listed in your After Visit Summary, but are typically between 1-4 weeks after surgery.  OTHER  INSTRUCTIONS:   Knee Replacement:  Do not place pillow under knee, focus on keeping the knee straight while resting. CPM instructions: 0-90 degrees, 2 hours in the morning, 2 hours in the afternoon, and 2 hours in the evening. Place foam block, curve side up under heel at all times except when in CPM or when walking.  DO NOT modify, tear, cut, or change the foam block in any way.  MAKE SURE YOU:   Understand these instructions.   Get help right away if you are not doing well or get worse.    Thank you for letting us be a part of your medical care team.  It is a privilege we respect greatly.  We hope these instructions will help you stay on track for a fast and full recovery!

## 2016-02-09 ENCOUNTER — Other Ambulatory Visit: Payer: Self-pay | Admitting: Family Medicine

## 2016-02-22 ENCOUNTER — Ambulatory Visit: Payer: PRIVATE HEALTH INSURANCE | Attending: Orthopedic Surgery | Admitting: Physical Therapy

## 2016-02-22 ENCOUNTER — Encounter: Payer: Self-pay | Admitting: Physical Therapy

## 2016-02-22 DIAGNOSIS — M25562 Pain in left knee: Secondary | ICD-10-CM | POA: Diagnosis present

## 2016-02-22 DIAGNOSIS — R2243 Localized swelling, mass and lump, lower limb, bilateral: Secondary | ICD-10-CM | POA: Insufficient documentation

## 2016-02-22 DIAGNOSIS — M25561 Pain in right knee: Secondary | ICD-10-CM | POA: Insufficient documentation

## 2016-02-22 DIAGNOSIS — R262 Difficulty in walking, not elsewhere classified: Secondary | ICD-10-CM | POA: Diagnosis present

## 2016-02-22 DIAGNOSIS — M25661 Stiffness of right knee, not elsewhere classified: Secondary | ICD-10-CM | POA: Diagnosis present

## 2016-02-22 DIAGNOSIS — M25662 Stiffness of left knee, not elsewhere classified: Secondary | ICD-10-CM

## 2016-02-22 NOTE — Therapy (Signed)
[facility] [address] [place], [place], [address] Phone: [phone]   Fax:  [phone]  Physical Therapy Evaluation  Patient Details  Name: Kristin Hubbard MRN: [id number] Date of Birth: 01/19/1961 Referring Provider: Dion SaucierLandau  Encounter Date: 02/22/2016      PT End of Session - 02/22/16 0858    Visit Number 1   Date for PT Re-Evaluation 04/20/16   PT Start Time 0830   PT Stop Time 0920   PT Time Calculation (min) 50 min   Activity Tolerance Patient tolerated treatment well   Behavior [facility] Therapy [facility] for tasks assessed/performed      Past Medical History:  Diagnosis Date  . Anxiety   . Bilateral primary osteoarthritis of knee 01/31/2016  . Complication of anesthesia    after 1 st surgery was combative,other surgeries have been fine  . Hypertension   . Hypothyroidism   . Migraine   . Thyroid disease    Hypothyroidism    Past Surgical History:  Procedure Laterality Date  . BUNIONECTOMY     hammer toe  . CHOLECYSTECTOMY    . KNEE SURGERY  05/2007   left  . TOTAL KNEE ARTHROPLASTY Bilateral 01/31/2016   Procedure: TOTAL KNEE BILATERAL;  Surgeon: Teryl LucyJoshua Landau, MD;  Location: [facility] OR;  Service: Orthopedics;  Laterality: Bilateral;    There were no vitals filed for this visit.       Subjective Assessment - 02/22/16 0833    Subjective Patient underwent a bilateral TKR on 01/31/2016.  Reports everything has been going well, reports about a week ago she strained her knees some sitting on a chair too low.     Limitations Lifting;Standing;Walking;House hold activities   Patient Stated Goals walk and have less pain   Currently in Pain? Yes   Pain Score 3    Pain Location Knee   Pain Orientation Right;Left   Pain Descriptors / Indicators Aching;Sore;Tightness   Pain Type Surgical pain   Pain Radiating Towards reports that she is having some right sciatic pain mostly with lying down.   Pain Onset 1 to 4 weeks  ago   Pain Frequency Constant   Aggravating Factors  standing, walking and bending pain will be up to 7/10   Pain Relieving Factors pain meds, rest pain can be a 2/10   Effect of Pain on Daily Activities difficulty walking, standing and bending            [facility] PT Assessment - 02/22/16 0001      Assessment   Medical Diagnosis bilateral TKA's   Referring Provider Landau   Onset Date/Surgical Date 01/31/16   Prior Therapy at home     Precautions   Precautions None     Balance Screen   Has the patient fallen in the past 6 months No   Has the patient had a decrease in activity level because of a fear of falling?  No   Is the patient reluctant to leave their home because of a fear of falling?  No     Home Environment   Additional Comments no stairs, was doing her own housework     Prior Function   Level of Independence Independent   Vocation Full time employment   Designer, industrial/productVocation Requirements receptionist   Leisure works with a trainer 3x/week prior to 6 months ago     Observation/Other Assessments-Edema    Edema Circumferential     Circumferential Edema   Circumferential - Right  59.5 cm   Circumferential - Left  60.5 cm     ROM / Strength   AROM / PROM / Strength AROM;PROM;Strength     AROM   AROM Assessment Site Knee   Right/Left Knee Right;Left   Right Knee Extension 4   Right Knee Flexion 96   Left Knee Extension 6   Left Knee Flexion 100     PROM   PROM Assessment Site Knee   Right/Left Knee Right;Left   Right Knee Extension 0   Right Knee Flexion 104   Left Knee Extension 0   Left Knee Flexion 110     Strength   Overall Strength Comments 4/5 bilaterally flexion and extension     Palpation   Palpation comment steristrips in place, ballotable patella, mild tenderness     Ambulation/Gait   Gait Comments uses  a FWW slow, right knee is a stiff with minimal bending of the right knee     Standardized Balance Assessment   Standardized Balance Assessment  Timed Up and Go Test     Timed Up and Go Test   Normal TUG (seconds) 24                   [facility] Adult PT Treatment/Exercise - 02/22/16 0001      Exercises   Exercises Knee/Hip     Knee/Hip Exercises: Aerobic   Nustep level 5 x 6 minutes     Knee/Hip Exercises: Machines for Strengthening   Cybex Knee Extension 5# 2x10  had to do one leg at a time, the right leg needed assist   Cybex Knee Flexion 25# 2x10                PT Education - 02/22/16 0852    Education provided Yes   Education Details LLLD stretches and quad sets   Person(s) Educated Patient   Methods Explanation;Demonstration;Handout   Comprehension Verbalized understanding          PT Short Term Goals - 02/22/16 0917      PT SHORT TERM GOAL #1   Title independent with initial HEP   Time 2   Period Weeks   Status New           PT Long Term Goals - 02/22/16 1610      PT LONG TERM GOAL #1   Title decrease pain 50%   Time 8   Period Weeks   Status New     PT LONG TERM GOAL #2   Title increase AROM of the knees to 0-115 degrees flexion bilaterally   Time 8   Period Weeks   Status New     PT LONG TERM GOAL #3   Title walk 500 feet without stopping   Time 8   Period Weeks   Status New     PT LONG TERM GOAL #4   Title ambulate without deviation with a SPC or less device   Time 8   Period Weeks   Status New               Plan - 02/22/16 0858    Clinical Impression Statement Patient underwent bilateral TKA's on 01/31/16.  She is doing very well.  She tends to be stiff with the right leg when walking and does some circumduction, she is having some sciatica in the right buttock when lying down.  ROM is  about 4-95 degrees flexion bilaterally.  She has calf tightness, had some difficulty standing due  to fatigue and nausea today.   Rehab Potential Good   PT Frequency 3x / week   PT Duration 8 weeks   PT Treatment/Interventions ADLs/Self Care Home  Management;Cryotherapy;Moist Heat;Gait training;Functional mobility training;Patient/family education;Balance training;Therapeutic exercise;Therapeutic activities;Manual techniques;Vasopneumatic Device   PT Next Visit Plan work on returning her function and gait to normal   Consulted and Agree with Plan of Care Patient      Patient will benefit from skilled therapeutic intervention in order to improve the following deficits and impairments:  Abnormal gait, Decreased activity tolerance, Decreased balance, Decreased mobility, Decreased range of motion, Increased edema, Difficulty walking, Impaired flexibility, Pain  Visit Diagnosis: Acute pain of right knee - Plan: PT plan of care cert/re-cert  Acute pain of left knee - Plan: PT plan of care cert/re-cert  Difficulty in walking, not elsewhere classified - Plan: PT plan of care cert/re-cert  Stiffness of right knee, not elsewhere classified - Plan: PT plan of care cert/re-cert  Stiffness of left knee, not elsewhere classified - Plan: PT plan of care cert/re-cert  Localized swelling, mass and lump, lower limb, bilateral - Plan: PT plan of care cert/re-cert     Problem List Patient Active Problem List   Diagnosis Date Noted  . Bilateral primary osteoarthritis of knee 01/31/2016  . S/P total knee arthroplasty, bilateral 01/31/2016  . RBBB 12/30/2015  . Obesity (BMI 30-39.9) 01/23/2013  . Fever 01/19/2013  . Hyperlipidemia 10/31/2011  . POSTTRAUMATIC STRESS DISORDER 11/11/2009  . OTHER ACUTE REACTIONS TO STRESS 05/05/2009  . SCIATICA, RIGHT 01/21/2008  . GERD 07/22/2007  . FATIGUE 07/22/2007  . DEPRESSION 07/17/2007  . KNEE PAIN, RIGHT 05/09/2007  . ANXIETY 05/06/2007  . MIGRAINE HEADACHE 03/10/2007  . Morbid obesity (HCC) 08/23/2006  . CYST, SEBACEOUS 08/23/2006  . HYPOTHYROIDISM 03/12/2006  . Essential hypertension 03/12/2006    Stacie Glaze W.,PT 02/22/2016, 9:22 AM  [facility] [address] [place], [place], [address] Phone: [phone]   Fax:  [phone]  Name: Kristin Hubbard MRN: [id number] Date of Birth: 1960/12/22

## 2016-02-24 ENCOUNTER — Encounter: Payer: Self-pay | Admitting: Physical Therapy

## 2016-02-24 ENCOUNTER — Ambulatory Visit: Payer: PRIVATE HEALTH INSURANCE | Attending: Orthopedic Surgery | Admitting: Physical Therapy

## 2016-02-24 DIAGNOSIS — M25661 Stiffness of right knee, not elsewhere classified: Secondary | ICD-10-CM

## 2016-02-24 DIAGNOSIS — M25561 Pain in right knee: Secondary | ICD-10-CM

## 2016-02-24 DIAGNOSIS — R262 Difficulty in walking, not elsewhere classified: Secondary | ICD-10-CM | POA: Diagnosis present

## 2016-02-24 DIAGNOSIS — M25562 Pain in left knee: Secondary | ICD-10-CM | POA: Diagnosis present

## 2016-02-24 DIAGNOSIS — M25662 Stiffness of left knee, not elsewhere classified: Secondary | ICD-10-CM | POA: Diagnosis present

## 2016-02-24 DIAGNOSIS — R2243 Localized swelling, mass and lump, lower limb, bilateral: Secondary | ICD-10-CM

## 2016-02-24 NOTE — Therapy (Signed)
[facility] [address] [place], [place], [address] Phone: [phone]   Fax:  [phone]  Physical Therapy Treatment  Patient Details  Name: Kristin Hubbard MRN: [id number] Date of Birth: November 16, 1960 Referring Provider: Dion Saucier  Encounter Date: 02/24/2016      PT End of Session - 02/24/16 0842    Visit Number 2   Date for PT Re-Evaluation 04/20/16   PT Start Time 0800   PT Stop Time 0856   PT Time Calculation (min) 56 min   Activity Tolerance Patient tolerated treatment well;Patient limited by fatigue   Behavior During Therapy [facility] for tasks assessed/performed      Past Medical History:  Diagnosis Date  . Anxiety   . Bilateral primary osteoarthritis of knee 01/31/2016  . Complication of anesthesia    after 1 st surgery was combative,other surgeries have been fine  . Hypertension   . Hypothyroidism   . Migraine   . Thyroid disease    Hypothyroidism    Past Surgical History:  Procedure Laterality Date  . BUNIONECTOMY     hammer toe  . CHOLECYSTECTOMY    . KNEE SURGERY  05/2007   left  . TOTAL KNEE ARTHROPLASTY Bilateral 01/31/2016   Procedure: TOTAL KNEE BILATERAL;  Surgeon: Teryl Lucy, MD;  Location: [facility] OR;  Service: Orthopedics;  Laterality: Bilateral;    There were no vitals filed for this visit.      Subjective Assessment - 02/24/16 0803    Subjective A lot of pain after last treatment and had an anxiety attack due to the pain. Took a pill for anxiety and pain. Left her exercise sheet here.    Currently in Pain? Yes   Pain Score 5    Pain Location Knee   Pain Orientation Right;Left   Pain Descriptors / Indicators Tightness;Aching                         [facility] Adult PT Treatment/Exercise - 02/24/16 0001      Knee/Hip Exercises: Aerobic   Recumbent Bike x 4 mins   Nustep level 5 x 6 minutes     Knee/Hip Exercises: Machines for Strengthening   Cybex Knee Extension 10# 2x10   Cybex Knee Flexion 25# 2x10     Knee/Hip Exercises: Standing   Other Standing Knee Exercises TKE with black mat behind knees 2x10     Knee/Hip Exercises: Seated   Heel Slides AAROM;Strengthening;Both  x59mins with black strap      Knee/Hip Exercises: Supine   Heel Prop for Knee Extension 2 minutes  both legs     Modalities   Modalities Vasopneumatic;Cryotherapy     Cryotherapy   Number Minutes Cryotherapy 15 Minutes   Cryotherapy Location Knee  Left   Type of Cryotherapy Ice pack     Vasopneumatic   Number Minutes Vasopneumatic  15 minutes   Vasopnuematic Location  Knee   Vasopneumatic Pressure Medium   Vasopneumatic Temperature  32                  PT Short Term Goals - 02/22/16 0917      PT SHORT TERM GOAL #1   Title independent with initial HEP   Time 2   Period Weeks   Status New           PT Long Term Goals - 02/22/16 6962      PT LONG TERM GOAL #1   Title  decrease pain 50%   Time 8   Period Weeks   Status New     PT LONG TERM GOAL #2   Title increase AROM of the knees to 0-115 degrees flexion bilaterally   Time 8   Period Weeks   Status New     PT LONG TERM GOAL #3   Title walk 500 feet without stopping   Time 8   Period Weeks   Status New     PT LONG TERM GOAL #4   Title ambulate without deviation with a SPC or less device   Time 8   Period Weeks   Status New               Plan - 02/24/16 [id number]    Clinical Impression Statement Patient tolerated treatment well but due to extreme pain from last treatment execises were mainly in seated position. Patient left exercises and hadn't done any at home so she was given exercise sheets. Patient easily fatigued and needed rest break after each set and also in between exercises. Mod. VC on correct technique. Patient had no increased pain throughout therapy, but ankle props for two mins did cause some discomfort.   Rehab Potential Good   PT Frequency 3x / week   PT Duration 8 weeks    PT Treatment/Interventions ADLs/Self Care Home Management;Cryotherapy;Moist Heat;Gait training;Functional mobility training;Patient/family education;Balance training;Therapeutic exercise;Therapeutic activities;Manual techniques;Vasopneumatic Device   PT Next Visit Plan work on returning her function and gait to normal      Patient will benefit from skilled therapeutic intervention in order to improve the following deficits and impairments:  Abnormal gait, Decreased activity tolerance, Decreased balance, Decreased mobility, Decreased range of motion, Increased edema, Difficulty walking, Impaired flexibility, Pain  Visit Diagnosis: Acute pain of right knee  Acute pain of left knee  Difficulty in walking, not elsewhere classified  Stiffness of right knee, not elsewhere classified  Stiffness of left knee, not elsewhere classified  Localized swelling, mass and lump, lower limb, bilateral     Problem List Patient Active Problem List   Diagnosis Date Noted  . Bilateral primary osteoarthritis of knee 01/31/2016  . S/P total knee arthroplasty, bilateral 01/31/2016  . RBBB 12/30/2015  . Obesity (BMI 30-39.9) 01/23/2013  . Fever 01/19/2013  . Hyperlipidemia 10/31/2011  . POSTTRAUMATIC STRESS DISORDER 11/11/2009  . OTHER ACUTE REACTIONS TO STRESS 05/05/2009  . SCIATICA, RIGHT 01/21/2008  . GERD 07/22/2007  . FATIGUE 07/22/2007  . DEPRESSION 07/17/2007  . KNEE PAIN, RIGHT 05/09/2007  . ANXIETY 05/06/2007  . MIGRAINE HEADACHE 03/10/2007  . Morbid obesity (HCC) 08/23/2006  . CYST, SEBACEOUS 08/23/2006  . HYPOTHYROIDISM 03/12/2006  . Essential hypertension 03/12/2006    Phillips OdorBrittany Arlee Bossard, [place] 02/24/2016, 8:51 AM  [facility] [address] [place], [place], [address] Phone: [phone]   Fax:  [phone]  Name: Kristin AzureRobin Hubbard MRN: [id number] Date of Birth: Aug 03, 1960

## 2016-02-27 ENCOUNTER — Encounter: Payer: Self-pay | Admitting: Physical Therapy

## 2016-02-27 ENCOUNTER — Ambulatory Visit: Payer: PRIVATE HEALTH INSURANCE | Admitting: Physical Therapy

## 2016-02-27 DIAGNOSIS — M25561 Pain in right knee: Secondary | ICD-10-CM

## 2016-02-27 DIAGNOSIS — M25662 Stiffness of left knee, not elsewhere classified: Secondary | ICD-10-CM

## 2016-02-27 DIAGNOSIS — M25661 Stiffness of right knee, not elsewhere classified: Secondary | ICD-10-CM

## 2016-02-27 DIAGNOSIS — R2243 Localized swelling, mass and lump, lower limb, bilateral: Secondary | ICD-10-CM

## 2016-02-27 DIAGNOSIS — M25562 Pain in left knee: Secondary | ICD-10-CM

## 2016-02-27 DIAGNOSIS — R262 Difficulty in walking, not elsewhere classified: Secondary | ICD-10-CM

## 2016-02-27 NOTE — Therapy (Signed)
[facility] [address] [place], [place], [address] Phone: [phone]   Fax:  [phone]  Physical Therapy Treatment  Patient Details  Name: Kristin Hubbard MRN: [id number] Date of Birth: 1960-09-18 Referring Provider: Dion SaucierLandau  Encounter Date: 02/27/2016      PT End of Session - 02/27/16 0932    Visit Number 3   Date for PT Re-Evaluation 04/20/16   PT Start Time 0846   PT Stop Time 0949   PT Time Calculation (min) 63 min   Activity Tolerance Patient tolerated treatment well;Patient limited by fatigue   Behavior During Therapy [facility] for tasks assessed/performed      Past Medical History:  Diagnosis Date  . Anxiety   . Bilateral primary osteoarthritis of knee 01/31/2016  . Complication of anesthesia    after 1 st surgery was combative,other surgeries have been fine  . Hypertension   . Hypothyroidism   . Migraine   . Thyroid disease    Hypothyroidism    Past Surgical History:  Procedure Laterality Date  . BUNIONECTOMY     hammer toe  . CHOLECYSTECTOMY    . KNEE SURGERY  05/2007   left  . TOTAL KNEE ARTHROPLASTY Bilateral 01/31/2016   Procedure: TOTAL KNEE BILATERAL;  Surgeon: Teryl LucyJoshua Landau, MD;  [facility]: [facility] OR;  Service: Orthopedics;  Laterality: Bilateral;    There were no vitals filed for this visit.      Subjective Assessment - 02/27/16 0847    Subjective REports she continues to have soreness and fatigue after PT.  Her pain is mostly on the right posterior calf.   Currently in Pain? Yes   Pain Score 6    Pain Location Knee   Pain Orientation Right;Left   Pain Descriptors / Indicators Tightness;Sore                         [facility] Adult PT Treatment/Exercise - 02/27/16 0001      Ambulation/Gait   Gait Comments SPC, cues for what hand the cane goes in, for sequencing, overall she did very well, 100 feet x 2     Knee/Hip Exercises: Aerobic   Recumbent Bike 5 mins Level 0   Nustep  level 5 x 6 minutes     Knee/Hip Exercises: Machines for Strengthening   Cybex Knee Extension 10# 3x10   Cybex Knee Flexion 35# 3x10   Cybex Leg Press 20# 2x15, single legs 20# x 10 each   Hip Cybex 5# hip extension and hip abduction, had pain in the right posterior knee with the stnading     Vasopneumatic   Number Minutes Vasopneumatic  15 minutes   Vasopnuematic Location  Knee   Vasopneumatic Pressure Medium   Vasopneumatic Temperature  32                  PT Short Term Goals - 02/22/16 0917      PT SHORT TERM GOAL #1   Title independent with initial HEP   Time 2   Period Weeks   Status New           PT Long Term Goals - 02/22/16 [id number]      PT LONG TERM GOAL #1   Title decrease pain 50%   Time 8   Period Weeks   Status New     PT LONG TERM GOAL #2   Title increase AROM of the knees to 0-115 degrees flexion bilaterally  Time 8   Period Weeks   Status New     PT LONG TERM GOAL #3   Title walk 500 feet without stopping   Time 8   Period Weeks   Status New     PT LONG TERM GOAL #4   Title ambulate without deviation with a SPC or less device   Time 8   Period Weeks   Status New               Plan - 02/27/16 1610    Clinical Impression Statement Patient fatigues as the treatment session goes on, toward the end she requires more and more rest breaks, has pain inthe posterior right knee at times.  She was fearful onf the cane but did well, she will need to practice this.   PT Next Visit Plan work on returning her function and gait to normal   Consulted and Agree with Plan of Care Patient      Patient will benefit from skilled therapeutic intervention in order to improve the following deficits and impairments:  Abnormal gait, Decreased activity tolerance, Decreased balance, Decreased mobility, Decreased range of motion, Increased edema, Difficulty walking, Impaired flexibility, Pain  Visit Diagnosis: Acute pain of right knee  Acute pain  of left knee  Difficulty in walking, not elsewhere classified  Stiffness of right knee, not elsewhere classified  Stiffness of left knee, not elsewhere classified  Localized swelling, mass and lump, lower limb, bilateral     Problem List Patient Active Problem List   Diagnosis Date Noted  . Bilateral primary osteoarthritis of knee 01/31/2016  . S/P total knee arthroplasty, bilateral 01/31/2016  . RBBB 12/30/2015  . Obesity (BMI 30-39.9) 01/23/2013  . Fever 01/19/2013  . Hyperlipidemia 10/31/2011  . POSTTRAUMATIC STRESS DISORDER 11/11/2009  . OTHER ACUTE REACTIONS TO STRESS 05/05/2009  . SCIATICA, RIGHT 01/21/2008  . GERD 07/22/2007  . FATIGUE 07/22/2007  . DEPRESSION 07/17/2007  . KNEE PAIN, RIGHT 05/09/2007  . ANXIETY 05/06/2007  . MIGRAINE HEADACHE 03/10/2007  . Morbid obesity (HCC) 08/23/2006  . CYST, SEBACEOUS 08/23/2006  . HYPOTHYROIDISM 03/12/2006  . Essential hypertension 03/12/2006    Jearld Lesch., PT 02/27/2016, 9:58 AM  [facility] [address] [place], [place], [address] Phone: [phone]   Fax:  [phone]  Name: Kristin Hubbard MRN: [id number] Date of Birth: April 03, 1960

## 2016-02-29 ENCOUNTER — Ambulatory Visit: Payer: PRIVATE HEALTH INSURANCE | Admitting: Physical Therapy

## 2016-02-29 ENCOUNTER — Encounter: Payer: Self-pay | Admitting: Physical Therapy

## 2016-02-29 DIAGNOSIS — M25662 Stiffness of left knee, not elsewhere classified: Secondary | ICD-10-CM

## 2016-02-29 DIAGNOSIS — M25561 Pain in right knee: Secondary | ICD-10-CM

## 2016-02-29 DIAGNOSIS — M25562 Pain in left knee: Secondary | ICD-10-CM

## 2016-02-29 DIAGNOSIS — R262 Difficulty in walking, not elsewhere classified: Secondary | ICD-10-CM

## 2016-02-29 DIAGNOSIS — M25661 Stiffness of right knee, not elsewhere classified: Secondary | ICD-10-CM

## 2016-02-29 NOTE — Therapy (Signed)
[facility] [address] [place], [place], [address] Phone: [phone]   Fax:  [phone]  Physical Therapy Treatment  Patient Details  Name: Kristin Hubbard MRN: [id number] Date of Birth: [birth year] Referring Provider: Dion SaucierLandau  Encounter Date: 02/29/2016      PT End of Session - 02/29/16 0922    Visit Number 4   Date for PT Re-Evaluation 04/20/16   PT Start Time 0851   PT Stop Time 0945   PT Time Calculation (min) 54 min   Activity Tolerance Patient tolerated treatment well;Patient limited by fatigue      Past Medical History:  Diagnosis Date  . Anxiety   . Bilateral primary osteoarthritis of knee 01/31/2016  . Complication of anesthesia    after 1 st surgery was combative,other surgeries have been fine  . Hypertension   . Hypothyroidism   . Migraine   . Thyroid disease    Hypothyroidism    Past Surgical History:  Procedure Laterality Date  . BUNIONECTOMY     hammer toe  . CHOLECYSTECTOMY    . KNEE SURGERY  05/2007   left  . TOTAL KNEE ARTHROPLASTY Bilateral 01/31/2016   Procedure: TOTAL KNEE BILATERAL;  Surgeon: Teryl LucyJoshua Landau, MD;  [facility]: [facility];  Service: Orthopedics;  Laterality: Bilateral;    There were no vitals filed for this visit.      Subjective Assessment - 02/29/16 0855    Subjective brought in her walking stick for today.  not sleeping well; having more sciatica pain   Limitations Lifting;Standing;Walking;House hold activities   Patient Stated Goals walk and have less pain   Currently in Pain? Yes   Pain Score 6    Pain Location Knee   Pain Orientation Right;Left   Pain Descriptors / Indicators Tightness;Sore   Pain Type Surgical pain   Pain Onset 1 to 4 weeks ago   Pain Frequency Constant   Aggravating Factors  standing, walking, bending   Pain Relieving Factors pain meds, rest   Effect of Pain on Daily Activities difficulty walking, standing, bending                          [facility] Adult PT Treatment/Exercise - 02/29/16 0001      Ambulation/Gait   Ambulation Distance (Feet) 200 Feet   Assistive device Straight cane     Knee/Hip Exercises: Aerobic   Recumbent Bike 6min level 0   Nustep level 5 x 6 minutes     Knee/Hip Exercises: Machines for Strengthening   Cybex Knee Extension 10# 3x10   Cybex Knee Flexion 35# 3x10   Cybex Leg Press 20# 2x15, single legs 20# x 10 each   Hip Cybex --     Cryotherapy   Number Minutes Cryotherapy 15 Minutes   Cryotherapy Location Knee  Left   Type of Cryotherapy Ice pack     Vasopneumatic   Number Minutes Vasopneumatic  15 minutes   Vasopnuematic Location  Knee   Vasopneumatic Pressure Medium   Vasopneumatic Temperature  32                  PT Short Term Goals - 02/22/16 0917      PT SHORT TERM GOAL #1   Title independent with initial HEP   Time 2   Period Weeks   Status New           PT Long Term Goals - 02/22/16 [id number]  PT LONG TERM GOAL #1   Title decrease pain 50%   Time 8   Period Weeks   Status New     PT LONG TERM GOAL #2   Title increase AROM of the knees to 0-115 degrees flexion bilaterally   Time 8   Period Weeks   Status New     PT LONG TERM GOAL #3   Title walk 500 feet without stopping   Time 8   Period Weeks   Status New     PT LONG TERM GOAL #4   Title ambulate without deviation with a SPC or less device   Time 8   Period Weeks   Status New               Plan - 02/29/16 1008    Clinical Impression Statement Pt tolerated all well.  Initiated some gait with SPC.  Some cueing needed for correct UE placement and not taking such big steps.  REports that the fatigue is still occuring at the end of the sessions   Rehab Potential Good   PT Frequency 3x / week   PT Duration 8 weeks   PT Treatment/Interventions ADLs/Self Care Home Management;Cryotherapy;Moist Heat;Gait training;Functional mobility  training;Patient/family education;Balance training;Therapeutic exercise;Therapeutic activities;Manual techniques;Vasopneumatic Device   PT Next Visit Plan work on returning her function and gait to normal      Patient will benefit from skilled therapeutic intervention in order to improve the following deficits and impairments:  Abnormal gait, Decreased activity tolerance, Decreased balance, Decreased mobility, Decreased range of motion, Increased edema, Difficulty walking, Impaired flexibility, Pain  Visit Diagnosis: Acute pain of right knee  Acute pain of left knee  Difficulty in walking, not elsewhere classified  Stiffness of right knee, not elsewhere classified  Stiffness of left knee, not elsewhere classified     Problem List Patient Active Problem List   Diagnosis Date Noted  . Bilateral primary osteoarthritis of knee 01/31/2016  . S/P total knee arthroplasty, bilateral 01/31/2016  . RBBB 12/30/2015  . Obesity (BMI 30-39.9) 01/23/2013  . Fever 01/19/2013  . Hyperlipidemia 10/31/2011  . POSTTRAUMATIC STRESS DISORDER 11/11/2009  . OTHER ACUTE REACTIONS TO STRESS 05/05/2009  . SCIATICA, RIGHT 01/21/2008  . GERD 07/22/2007  . FATIGUE 07/22/2007  . DEPRESSION 07/17/2007  . KNEE PAIN, RIGHT 05/09/2007  . ANXIETY 05/06/2007  . MIGRAINE HEADACHE 03/10/2007  . Morbid obesity (HCC) 08/23/2006  . CYST, SEBACEOUS 08/23/2006  . HYPOTHYROIDISM 03/12/2006  . Essential hypertension 03/12/2006    [name] 02/29/2016, 12:01 PM  [facility] [address] [place], [place], [address] Phone: [phone]   Fax:  [phone]  Name: Kristin Hubbard MRN: [id number] Date of Birth: April 08, 1960

## 2016-03-02 ENCOUNTER — Ambulatory Visit: Payer: PRIVATE HEALTH INSURANCE | Admitting: Physical Therapy

## 2016-03-05 ENCOUNTER — Encounter: Payer: Self-pay | Admitting: Physical Therapy

## 2016-03-05 ENCOUNTER — Ambulatory Visit: Payer: PRIVATE HEALTH INSURANCE | Admitting: Physical Therapy

## 2016-03-05 DIAGNOSIS — R262 Difficulty in walking, not elsewhere classified: Secondary | ICD-10-CM

## 2016-03-05 DIAGNOSIS — M25561 Pain in right knee: Secondary | ICD-10-CM | POA: Diagnosis not present

## 2016-03-05 DIAGNOSIS — M25662 Stiffness of left knee, not elsewhere classified: Secondary | ICD-10-CM

## 2016-03-05 DIAGNOSIS — M25661 Stiffness of right knee, not elsewhere classified: Secondary | ICD-10-CM

## 2016-03-05 DIAGNOSIS — M25562 Pain in left knee: Secondary | ICD-10-CM

## 2016-03-05 DIAGNOSIS — R2243 Localized swelling, mass and lump, lower limb, bilateral: Secondary | ICD-10-CM

## 2016-03-05 NOTE — Therapy (Signed)
[facility] [address] [place], [place], [address] Phone: [phone]   Fax:  [phone]  Physical Therapy Treatment  Patient Details  Name: Kristin Hubbard MRN: [id number] Date of Birth: 03-13-1960 Referring Provider: Dion Saucier  Encounter Date: 03/05/2016      PT End of Session - 03/05/16 0930    Visit Number 5   Date for PT Re-Evaluation 04/20/16   PT Start Time 0845   PT Stop Time 0945   PT Time Calculation (min) 60 min   Activity Tolerance Patient tolerated treatment well;Patient limited by fatigue      Past Medical History:  Diagnosis Date  . Anxiety   . Bilateral primary osteoarthritis of knee 01/31/2016  . Complication of anesthesia    after 1 st surgery was combative,other surgeries have been fine  . Hypertension   . Hypothyroidism   . Migraine   . Thyroid disease    Hypothyroidism    Past Surgical History:  Procedure Laterality Date  . BUNIONECTOMY     hammer toe  . CHOLECYSTECTOMY    . KNEE SURGERY  05/2007   left  . TOTAL KNEE ARTHROPLASTY Bilateral 01/31/2016   Procedure: TOTAL KNEE BILATERAL;  Surgeon: Teryl Lucy, MD;  Location: [facility] OR;  Service: Orthopedics;  Laterality: Bilateral;    There were no vitals filed for this visit.      Subjective Assessment - 03/05/16 0851    Subjective decided to switch walking stick to the R hand just because the L hand felt funny.  Did laundry and meal prep yesterday.  Now showering Ind   Limitations Lifting;Standing;Walking;House hold activities   Patient Stated Goals walk and have less pain   Currently in Pain? Yes   Pain Score 4    Pain Location Knee   Pain Orientation Right;Left   Aggravating Factors  standing, walking, bending   Pain Relieving Factors pain meds, rest                         [facility] Adult PT Treatment/Exercise - 03/05/16 0001      Knee/Hip Exercises: Aerobic   Recumbent Bike level 0x 2 backward per pt  request   Nustep level 6x75min     Knee/Hip Exercises: Standing   Rebounder march and standing balance 2 hand hold x 60"   Other Standing Knee Exercises 3 way hip at windowsill 2# 2x10 each      Modalities   Modalities Vasopneumatic;Cryotherapy     Cryotherapy   Number Minutes Cryotherapy 15 Minutes   Cryotherapy Location Knee   Type of Cryotherapy Ice pack     Vasopneumatic   Number Minutes Vasopneumatic  15 minutes   Vasopnuematic Location  Knee   Vasopneumatic Pressure Medium   Vasopneumatic Temperature  32                  PT Short Term Goals - 02/22/16 0917      PT SHORT TERM GOAL #1   Title independent with initial HEP   Time 2   Period Weeks   Status New           PT Long Term Goals - 02/22/16 6962      PT LONG TERM GOAL #1   Title decrease pain 50%   Time 8   Period Weeks   Status New     PT LONG TERM GOAL #2   Title increase AROM of the  knees to 0-115 degrees flexion bilaterally   Time 8   Period Weeks   Status New     PT LONG TERM GOAL #3   Title walk 500 feet without stopping   Time 8   Period Weeks   Status New     PT LONG TERM GOAL #4   Title ambulate without deviation with a SPC or less device   Time 8   Period Weeks   Status New               Plan - 03/05/16 0930    Clinical Impression Statement Focus more on balance and standing endurance today due to complaints of being really sore after treatment;  patient did well on rebounder despite reporting she would not be able to do this   Rehab Potential Good   PT Frequency 3x / week   PT Duration 8 weeks   PT Treatment/Interventions ADLs/Self Care Home Management;Cryotherapy;Moist Heat;Gait training;Functional mobility training;Patient/family education;Balance training;Therapeutic exercise;Therapeutic activities;Manual techniques;Vasopneumatic Device   PT Next Visit Plan work on returning her function and gait to normal   Consulted and Agree with Plan of Care Patient       Patient will benefit from skilled therapeutic intervention in order to improve the following deficits and impairments:  Abnormal gait, Decreased activity tolerance, Decreased balance, Decreased mobility, Decreased range of motion, Increased edema, Difficulty walking, Impaired flexibility, Pain  Visit Diagnosis: Acute pain of right knee  Acute pain of left knee  Difficulty in walking, not elsewhere classified  Stiffness of right knee, not elsewhere classified  Stiffness of left knee, not elsewhere classified  Localized swelling, mass and lump, lower limb, bilateral     Problem List Patient Active Problem List   Diagnosis Date Noted  . Bilateral primary osteoarthritis of knee 01/31/2016  . S/P total knee arthroplasty, bilateral 01/31/2016  . RBBB 12/30/2015  . Obesity (BMI 30-39.9) 01/23/2013  . Fever 01/19/2013  . Hyperlipidemia 10/31/2011  . POSTTRAUMATIC STRESS DISORDER 11/11/2009  . OTHER ACUTE REACTIONS TO STRESS 05/05/2009  . SCIATICA, RIGHT 01/21/2008  . GERD 07/22/2007  . FATIGUE 07/22/2007  . DEPRESSION 07/17/2007  . KNEE PAIN, RIGHT 05/09/2007  . ANXIETY 05/06/2007  . MIGRAINE HEADACHE 03/10/2007  . Morbid obesity (HCC) 08/23/2006  . CYST, SEBACEOUS 08/23/2006  . HYPOTHYROIDISM 03/12/2006  . Essential hypertension 03/12/2006    Idamae Lusherevis, Kara R 03/05/2016, 9:32 AM  [facility] [address] [place], [place], [address] Phone: [phone]   Fax:  [phone]  Name: Kristin Hubbard MRN: [id number] Date of Birth: Feb 23, 1960

## 2016-03-07 ENCOUNTER — Ambulatory Visit: Payer: PRIVATE HEALTH INSURANCE | Admitting: Physical Therapy

## 2016-03-07 ENCOUNTER — Encounter: Payer: Self-pay | Admitting: Physical Therapy

## 2016-03-07 DIAGNOSIS — R2243 Localized swelling, mass and lump, lower limb, bilateral: Secondary | ICD-10-CM

## 2016-03-07 DIAGNOSIS — M25662 Stiffness of left knee, not elsewhere classified: Secondary | ICD-10-CM

## 2016-03-07 DIAGNOSIS — M25561 Pain in right knee: Secondary | ICD-10-CM | POA: Diagnosis not present

## 2016-03-07 DIAGNOSIS — R262 Difficulty in walking, not elsewhere classified: Secondary | ICD-10-CM

## 2016-03-07 DIAGNOSIS — M25661 Stiffness of right knee, not elsewhere classified: Secondary | ICD-10-CM

## 2016-03-07 DIAGNOSIS — M25562 Pain in left knee: Secondary | ICD-10-CM

## 2016-03-07 NOTE — Therapy (Signed)
[facility] [address] [place], [place], [address] Phone: [phone]   Fax:  [phone]  Physical Therapy Treatment  Patient Details  Name: Kristin AzureRobin Hubbard MRN: [id number] Date of Birth: 1960/04/03 Referring Provider: Dion SaucierLandau  Encounter Date: 03/07/2016      PT End of Session - 03/07/16 1057    Visit Number 6   Date for PT Re-Evaluation 04/20/16   PT Start Time 1015   PT Stop Time 1108   PT Time Calculation (min) 53 min   Activity Tolerance Patient tolerated treatment well;Patient limited by fatigue   Behavior During Therapy [facility] for tasks assessed/performed      Past Medical History:  Diagnosis Date  . Anxiety   . Bilateral primary osteoarthritis of knee 01/31/2016  . Complication of anesthesia    after 1 st surgery was combative,other surgeries have been fine  . Hypertension   . Hypothyroidism   . Migraine   . Thyroid disease    Hypothyroidism    Past Surgical History:  Procedure Laterality Date  . BUNIONECTOMY     hammer toe  . CHOLECYSTECTOMY    . KNEE SURGERY  05/2007   left  . TOTAL KNEE ARTHROPLASTY Bilateral 01/31/2016   Procedure: TOTAL KNEE BILATERAL;  Surgeon: Teryl LucyJoshua Landau, MD;  Location: [facility] OR;  Service: Orthopedics;  Laterality: Bilateral;    There were no vitals filed for this visit.      Subjective Assessment - 03/07/16 1011    Subjective Doing okay, felt sore after last treatment and has been walking.    Currently in Pain? Yes   Pain Score 2    Pain Location Knee   Pain Orientation Right;Left            [facility] PT Assessment - 03/07/16 0001      AROM   Right Knee Extension 0   Right Knee Flexion 105   Left Knee Extension 10   Left Knee Flexion 105                     [facility] Adult PT Treatment/Exercise - 03/07/16 0001      Knee/Hip Exercises: Aerobic   Recumbent Bike 6min level 0x 2 backward per pt request   Nustep level 6x616min     Knee/Hip Exercises:  Machines for Strengthening   Cybex Knee Extension 10# 1x10 20# 2x10    Cybex Knee Flexion 35# x10, 45# x10, 55# x10   Cybex Leg Press 40# 2x15      Knee/Hip Exercises: Standing   Other Standing Knee Exercises TKE with green band on blue physio ball 2x10      Modalities   Modalities Vasopneumatic;Cryotherapy     Cryotherapy   Number Minutes Cryotherapy 15 Minutes   Cryotherapy Location Knee   Type of Cryotherapy Ice pack     Vasopneumatic   Number Minutes Vasopneumatic  15 minutes   Vasopnuematic Location  Knee   Vasopneumatic Pressure Medium   Vasopneumatic Temperature  32                  PT Short Term Goals - 02/22/16 [id number]      PT SHORT TERM GOAL #1   Title independent with initial HEP   Time 2   Period Weeks   Status New           PT Long Term Goals - 03/07/16 1102      PT LONG TERM GOAL #1  Title decrease pain 50%   Status On-going     PT LONG TERM GOAL #2   Title increase AROM of the knees to 0-115 degrees flexion bilaterally   Baseline Right 0-105 Left 10-105   Status On-going     PT LONG TERM GOAL #3   Title walk 500 feet without stopping   Status On-going     PT LONG TERM GOAL #4   Title ambulate without deviation with a SPC or less device   Status On-going               Plan - 03/07/16 1058    Clinical Impression Statement Patient tolerated treatment well and was able to increase resistance in all exercises today. Patient had no increased pain except during TKE on physio ball which was located on the left lateral knee. Patient needed min. VC for correct technique. Patient ROM was measured on the recumbent bike and the right was 0-105 and the left was -10-105.    Rehab Potential Good   PT Frequency 3x / week   PT Duration 8 weeks   PT Treatment/Interventions ADLs/Self Care Home Management;Cryotherapy;Moist Heat;Gait training;Functional mobility training;Patient/family education;Balance training;Therapeutic exercise;Therapeutic  activities;Manual techniques;Vasopneumatic Device   PT Next Visit Plan work on returning her function and gait to normal      Patient will benefit from skilled therapeutic intervention in order to improve the following deficits and impairments:  Abnormal gait, Decreased activity tolerance, Decreased balance, Decreased mobility, Decreased range of motion, Increased edema, Difficulty walking, Impaired flexibility, Pain  Visit Diagnosis: Acute pain of right knee  Acute pain of left knee  Difficulty in walking, not elsewhere classified  Stiffness of right knee, not elsewhere classified  Stiffness of left knee, not elsewhere classified  Localized swelling, mass and lump, lower limb, bilateral     Problem List Patient Active Problem List   Diagnosis Date Noted  . Bilateral primary osteoarthritis of knee 01/31/2016  . S/P total knee arthroplasty, bilateral 01/31/2016  . RBBB 12/30/2015  . Obesity (BMI 30-39.9) 01/23/2013  . Fever 01/19/2013  . Hyperlipidemia 10/31/2011  . POSTTRAUMATIC STRESS DISORDER 11/11/2009  . OTHER ACUTE REACTIONS TO STRESS 05/05/2009  . SCIATICA, RIGHT 01/21/2008  . GERD 07/22/2007  . FATIGUE 07/22/2007  . DEPRESSION 07/17/2007  . KNEE PAIN, RIGHT 05/09/2007  . ANXIETY 05/06/2007  . MIGRAINE HEADACHE 03/10/2007  . Morbid obesity (HCC) 08/23/2006  . CYST, SEBACEOUS 08/23/2006  . HYPOTHYROIDISM 03/12/2006  . Essential hypertension 03/12/2006    Kristin Hubbard  03/07/2016, 11:03 AM  [facility] [address] [place], [place], [address] Phone: [phone]   Fax:  [phone]  Name: Kristin Hubbard MRN: [id number] Date of Birth: 1960-04-26

## 2016-03-13 ENCOUNTER — Encounter: Payer: Self-pay | Admitting: Physical Therapy

## 2016-03-13 ENCOUNTER — Ambulatory Visit: Payer: PRIVATE HEALTH INSURANCE | Admitting: Physical Therapy

## 2016-03-13 DIAGNOSIS — M25662 Stiffness of left knee, not elsewhere classified: Secondary | ICD-10-CM

## 2016-03-13 DIAGNOSIS — M25561 Pain in right knee: Secondary | ICD-10-CM | POA: Diagnosis not present

## 2016-03-13 DIAGNOSIS — M25661 Stiffness of right knee, not elsewhere classified: Secondary | ICD-10-CM

## 2016-03-13 DIAGNOSIS — R262 Difficulty in walking, not elsewhere classified: Secondary | ICD-10-CM

## 2016-03-13 DIAGNOSIS — R2243 Localized swelling, mass and lump, lower limb, bilateral: Secondary | ICD-10-CM

## 2016-03-13 DIAGNOSIS — M25562 Pain in left knee: Secondary | ICD-10-CM

## 2016-03-13 NOTE — Therapy (Signed)
[facility] [address] [place], [place], [address] Phone: [phone]   Fax:  [phone]  Physical Therapy Treatment  Patient Details  Name: Kristin Hubbard MRN: [id number] Date of Birth: 18-Jul-1960 Referring Provider: Dion Saucier  Encounter Date: 03/13/2016      PT End of Session - 03/13/16 1619    Visit Number 7   Date for PT Re-Evaluation 04/20/16   PT Start Time 1530   PT Stop Time 1635   PT Time Calculation (min) 65 min      Past Medical History:  Diagnosis Date  . Anxiety   . Bilateral primary osteoarthritis of knee 01/31/2016  . Complication of anesthesia    after 1 st surgery was combative,other surgeries have been fine  . Hypertension   . Hypothyroidism   . Migraine   . Thyroid disease    Hypothyroidism    Past Surgical History:  Procedure Laterality Date  . BUNIONECTOMY     hammer toe  . CHOLECYSTECTOMY    . KNEE SURGERY  05/2007   left  . TOTAL KNEE ARTHROPLASTY Bilateral 01/31/2016   Procedure: TOTAL KNEE BILATERAL;  Surgeon: Teryl Lucy, MD;  Location: [facility] OR;  Service: Orthopedics;  Laterality: Bilateral;    There were no vitals filed for this visit.      Subjective Assessment - 03/13/16 1530    Subjective stiff and sore   Pain Score 6    Pain Location Knee            [facility] PT Assessment - 03/13/16 0001      AROM   Right Knee Extension 0   Right Knee Flexion 110   Left Knee Extension 3   Left Knee Flexion 112                     [facility] Adult PT Treatment/Exercise - 03/13/16 0001      Ambulation/Gait   Gait Comments amb without AD working in wt shift, knee flex and equal stride- did well with cuing but poor endurance     Knee/Hip Exercises: Aerobic   Recumbent Bike 6 min   Nustep level 6x28min     Knee/Hip Exercises: Seated   Long Arc Quad Both;15 reps  green tband 3 sec TKE hold   Clamshell with TheraBand Green   Marching Limitations green tband   Hamstring Curl Both;1 set;15 reps  green tband   Sit to Sand 20 reps;without UE support  wt ball     Cryotherapy   Number Minutes Cryotherapy 15 Minutes   Cryotherapy Location Knee   Type of Cryotherapy Ice pack     Vasopneumatic   Number Minutes Vasopneumatic  15 minutes   Vasopnuematic Location  Knee   Vasopneumatic Pressure Medium   Vasopneumatic Temperature  32     Manual Therapy   Manual Therapy Passive ROM;Joint mobilization   Manual therapy comments Rt and Left knee                  PT Short Term Goals - 02/22/16 2725      PT SHORT TERM GOAL #1   Title independent with initial HEP   Time 2   Period Weeks   Status New           PT Long Term Goals - 03/13/16 1619      PT LONG TERM GOAL #1   Title decrease pain 50%   Status On-going  PT LONG TERM GOAL #2   Title increase AROM of the knees to 0-115 degrees flexion bilaterally   Status On-going     PT LONG TERM GOAL #3   Title walk 500 feet without stopping   Baseline edcuated on walking program at home to build endurance   Status On-going     PT LONG TERM GOAL #4   Title ambulate without deviation with a SPC or less device   Status On-going               Plan - 03/13/16 1620    Clinical Impression Statement progressing with goals, excellent ROM. working on strength and func endurance. Working on gait without AD and decreasing deficiets.   PT Next Visit Plan STEPS and gait      Patient will benefit from skilled therapeutic intervention in order to improve the following deficits and impairments:  Abnormal gait, Decreased activity tolerance, Decreased balance, Decreased mobility, Decreased range of motion, Increased edema, Difficulty walking, Impaired flexibility, Pain  Visit Diagnosis: Acute pain of right knee  Acute pain of left knee  Difficulty in walking, not elsewhere classified  Stiffness of right knee, not elsewhere classified  Stiffness of left knee, not elsewhere  classified  Localized swelling, mass and lump, lower limb, bilateral     Problem List Patient Active Problem List   Diagnosis Date Noted  . Bilateral primary osteoarthritis of knee 01/31/2016  . S/P total knee arthroplasty, bilateral 01/31/2016  . RBBB 12/30/2015  . Obesity (BMI 30-39.9) 01/23/2013  . Fever 01/19/2013  . Hyperlipidemia 10/31/2011  . POSTTRAUMATIC STRESS DISORDER 11/11/2009  . OTHER ACUTE REACTIONS TO STRESS 05/05/2009  . SCIATICA, RIGHT 01/21/2008  . GERD 07/22/2007  . FATIGUE 07/22/2007  . DEPRESSION 07/17/2007  . KNEE PAIN, RIGHT 05/09/2007  . ANXIETY 05/06/2007  . MIGRAINE HEADACHE 03/10/2007  . Morbid obesity (HCC) 08/23/2006  . CYST, SEBACEOUS 08/23/2006  . HYPOTHYROIDISM 03/12/2006  . Essential hypertension 03/12/2006    PAYSEUR,ANGIE PTA 03/13/2016, 4:22 PM  [facility] [address] [place], [place], [address] Phone: [phone]   Fax:  [phone]  Name: Kristin Hubbard MRN: [id number] Date of Birth: 1960-12-20

## 2016-03-16 ENCOUNTER — Encounter: Payer: Self-pay | Admitting: Physical Therapy

## 2016-03-16 ENCOUNTER — Ambulatory Visit: Payer: PRIVATE HEALTH INSURANCE | Admitting: Physical Therapy

## 2016-03-16 DIAGNOSIS — M25662 Stiffness of left knee, not elsewhere classified: Secondary | ICD-10-CM

## 2016-03-16 DIAGNOSIS — R262 Difficulty in walking, not elsewhere classified: Secondary | ICD-10-CM

## 2016-03-16 DIAGNOSIS — M25561 Pain in right knee: Secondary | ICD-10-CM

## 2016-03-16 DIAGNOSIS — R2243 Localized swelling, mass and lump, lower limb, bilateral: Secondary | ICD-10-CM

## 2016-03-16 DIAGNOSIS — M25661 Stiffness of right knee, not elsewhere classified: Secondary | ICD-10-CM

## 2016-03-16 DIAGNOSIS — M25562 Pain in left knee: Secondary | ICD-10-CM

## 2016-03-16 NOTE — Therapy (Signed)
[facility] [address] [place], [place], [address] Phone: [phone]   Fax:  [phone]  Physical Therapy Treatment  Patient Details  Name: Kristin Hubbard MRN: [id number] Date of Birth: 23-Feb-1960 Referring Provider: Dion Saucier  Encounter Date: 03/16/2016      PT End of Session - 03/16/16 0926    Visit Number 8   Date for PT Re-Evaluation 04/20/16   PT Start Time 0845   PT Stop Time 0947   PT Time Calculation (min) 62 min      Past Medical History:  Diagnosis Date  . Anxiety   . Bilateral primary osteoarthritis of knee 01/31/2016  . Complication of anesthesia    after 1 st surgery was combative,other surgeries have been fine  . Hypertension   . Hypothyroidism   . Migraine   . Thyroid disease    Hypothyroidism    Past Surgical History:  Procedure Laterality Date  . BUNIONECTOMY     hammer toe  . CHOLECYSTECTOMY    . KNEE SURGERY  05/2007   left  . TOTAL KNEE ARTHROPLASTY Bilateral 01/31/2016   Procedure: TOTAL KNEE BILATERAL;  Surgeon: Teryl Lucy, MD;  Location: [facility] OR;  Service: Orthopedics;  Laterality: Bilateral;    There were no vitals filed for this visit.      Subjective Assessment - 03/16/16 0849    Subjective my quads were very sore after last session but joint good. MD very pleased 2 more weeks than can start some at gym too   Currently in Pain? Yes   Pain Score 4    Pain Location Knee   Pain Orientation Right;Left                         [facility] Adult PT Treatment/Exercise - 03/16/16 0001      Ambulation/Gait   Stairs Yes   Stairs Assistance 5: Supervision   Stair Management Technique One rail Right;Step to pattern  step over step up     Knee/Hip Exercises: Aerobic   Recumbent Bike 6 min   Nustep level 6x58min     Knee/Hip Exercises: Machines for Strengthening   Cybex Knee Extension 20# 3 sets 10 hold 3 sec TKE   Cybex Knee Flexion 45 # 2 sets 15   Cybex Leg Press  50# 1x15   70# 1 set 15     Knee/Hip Exercises: Standing   Heel Raises Both;2 sets;15 reps  on black bar   Lateral Step Up Both;1 set;10 reps;Hand Hold: 1;Step Height: 6"   Forward Step Up Both;1 set;10 reps;Hand Hold: 1;Step Height: 6"     Cryotherapy   Number Minutes Cryotherapy 15 Minutes   Cryotherapy Location Knee   Type of Cryotherapy Ice pack     Vasopneumatic   Number Minutes Vasopneumatic  15 minutes   Vasopnuematic Location  Knee   Vasopneumatic Pressure Medium   Vasopneumatic Temperature  32                  PT Short Term Goals - 02/22/16 0917      PT SHORT TERM GOAL #1   Title independent with initial HEP   Time 2   Period Weeks   Status New           PT Long Term Goals - 03/13/16 1619      PT LONG TERM GOAL #1   Title decrease pain 50%   Status On-going  PT LONG TERM GOAL #2   Title increase AROM of the knees to 0-115 degrees flexion bilaterally   Status On-going     PT LONG TERM GOAL #3   Title walk 500 feet without stopping   Baseline edcuated on walking program at home to build endurance   Status On-going     PT LONG TERM GOAL #4   Title ambulate without deviation with a SPC or less device   Status On-going               Plan - 03/16/16 16100926    Clinical Impression Statement amb without AD in clinic with improved knee flexion bilaterally and good wt shift. tol steps fair, instructed to do step ups at home ot strengthen muscles.   PT Next Visit Plan strength and ROM      Patient will benefit from skilled therapeutic intervention in order to improve the following deficits and impairments:  Abnormal gait, Decreased activity tolerance, Decreased balance, Decreased mobility, Decreased range of motion, Increased edema, Difficulty walking, Impaired flexibility, Pain  Visit Diagnosis: Acute pain of right knee  Acute pain of left knee  Difficulty in walking, not elsewhere classified  Stiffness of right knee, not  elsewhere classified  Stiffness of left knee, not elsewhere classified  Localized swelling, mass and lump, lower limb, bilateral     Problem List Patient Active Problem List   Diagnosis Date Noted  . Bilateral primary osteoarthritis of knee 01/31/2016  . S/P total knee arthroplasty, bilateral 01/31/2016  . RBBB 12/30/2015  . Obesity (BMI 30-39.9) 01/23/2013  . Fever 01/19/2013  . Hyperlipidemia 10/31/2011  . POSTTRAUMATIC STRESS DISORDER 11/11/2009  . OTHER ACUTE REACTIONS TO STRESS 05/05/2009  . SCIATICA, RIGHT 01/21/2008  . GERD 07/22/2007  . FATIGUE 07/22/2007  . DEPRESSION 07/17/2007  . KNEE PAIN, RIGHT 05/09/2007  . ANXIETY 05/06/2007  . MIGRAINE HEADACHE 03/10/2007  . Morbid obesity (HCC) 08/23/2006  . CYST, SEBACEOUS 08/23/2006  . HYPOTHYROIDISM 03/12/2006  . Essential hypertension 03/12/2006    PAYSEUR,ANGIE PTA 03/16/2016, 9:28 AM  [facility] [address] [place], [place], [address] Phone: [phone]   Fax:  [phone]  Name: Hubert AzureRobin Delima MRN: [id number] Date of Birth: [birth year]

## 2016-03-18 ENCOUNTER — Other Ambulatory Visit: Payer: Self-pay | Admitting: Family Medicine

## 2016-03-20 ENCOUNTER — Ambulatory Visit: Payer: PRIVATE HEALTH INSURANCE | Admitting: Physical Therapy

## 2016-03-20 ENCOUNTER — Encounter: Payer: Self-pay | Admitting: Physical Therapy

## 2016-03-20 DIAGNOSIS — M25561 Pain in right knee: Secondary | ICD-10-CM

## 2016-03-20 DIAGNOSIS — M25662 Stiffness of left knee, not elsewhere classified: Secondary | ICD-10-CM

## 2016-03-20 DIAGNOSIS — M25661 Stiffness of right knee, not elsewhere classified: Secondary | ICD-10-CM

## 2016-03-20 DIAGNOSIS — R262 Difficulty in walking, not elsewhere classified: Secondary | ICD-10-CM

## 2016-03-20 DIAGNOSIS — M25562 Pain in left knee: Secondary | ICD-10-CM

## 2016-03-20 DIAGNOSIS — R2243 Localized swelling, mass and lump, lower limb, bilateral: Secondary | ICD-10-CM

## 2016-03-20 NOTE — Therapy (Signed)
[facility] [address] [place], [place], [address] Phone: [phone]   Fax:  [phone]  Physical Therapy Treatment  Patient Details  Name: Kristin AzureRobin Hubbard MRN: [id number] Date of Birth: 1960-07-22 Referring Provider: Dion SaucierLandau  Encounter Date: 03/20/2016      PT End of Session - 03/20/16 0928    Visit Number 9   Date for PT Re-Evaluation 04/20/16   PT Start Time 0845   PT Stop Time 0942   PT Time Calculation (min) 57 min      Past Medical History:  Diagnosis Date  . Anxiety   . Bilateral primary osteoarthritis of knee 01/31/2016  . Complication of anesthesia    after 1 st surgery was combative,other surgeries have been fine  . Hypertension   . Hypothyroidism   . Migraine   . Thyroid disease    Hypothyroidism    Past Surgical History:  Procedure Laterality Date  . BUNIONECTOMY     hammer toe  . CHOLECYSTECTOMY    . KNEE SURGERY  05/2007   left  . TOTAL KNEE ARTHROPLASTY Bilateral 01/31/2016   Procedure: TOTAL KNEE BILATERAL;  Surgeon: Teryl LucyJoshua Landau, MD;  Location: [facility] OR;  Service: Orthopedics;  Laterality: Bilateral;    There were no vitals filed for this visit.      Subjective Assessment - 03/20/16 0846    Subjective "okay"   Currently in Pain? Yes   Pain Score 5    Pain Location Knee   Pain Orientation Right;Left                         [facility] Adult PT Treatment/Exercise - 03/20/16 0001      Knee/Hip Exercises: Aerobic   Elliptical 3 min I 5 R 4  some increased pain and 1 stop to rest   [employer] push fwd and back 15 each way each direction   Nustep level 6x836min     Knee/Hip Exercises: Machines for Strengthening   Cybex Leg Press 70# 15 times, SL 10 each      Knee/Hip Exercises: Standing   Other Standing Knee Exercises 40# pulley press down 15 each   Other Standing Knee Exercises airex step over 15 times each     Cryotherapy   Number Minutes Cryotherapy 15 Minutes   Cryotherapy Location Knee   Type of Cryotherapy Ice pack     Vasopneumatic   Number Minutes Vasopneumatic  15 minutes   Vasopnuematic Location  Knee   Vasopneumatic Pressure Medium                  PT Short Term Goals - 02/22/16 25360917      PT SHORT TERM GOAL #1   Title independent with initial HEP   Time 2   Period Weeks   Status New           PT Long Term Goals - 03/13/16 1619      PT LONG TERM GOAL #1   Title decrease pain 50%   Status On-going     PT LONG TERM GOAL #2   Title increase AROM of the knees to 0-115 degrees flexion bilaterally   Status On-going     PT LONG TERM GOAL #3   Title walk 500 feet without stopping   Baseline edcuated on walking program at home to build endurance   Status On-going     PT LONG TERM GOAL #4   Title ambulate  without deviation with a SPC or less device   Status On-going               Plan - 03/20/16 1610    Clinical Impression Statement pt c/o fatigue only 3 hours sleep ,everything appeared harder today with increase rest needed and c/o pain with RT activities in standing but also fearful of bending in wt bearing.   PT Next Visit Plan strength and ROM, check ROM, 10th visit      Patient will benefit from skilled therapeutic intervention in order to improve the following deficits and impairments:  Abnormal gait, Decreased activity tolerance, Decreased balance, Decreased mobility, Decreased range of motion, Increased edema, Difficulty walking, Impaired flexibility, Pain  Visit Diagnosis: Acute pain of right knee  Acute pain of left knee  Difficulty in walking, not elsewhere classified  Stiffness of right knee, not elsewhere classified  Stiffness of left knee, not elsewhere classified  Localized swelling, mass and lump, lower limb, bilateral     Problem List Patient Active Problem List   Diagnosis Date Noted  . Bilateral primary osteoarthritis of knee 01/31/2016  . S/P total knee arthroplasty,  bilateral 01/31/2016  . RBBB 12/30/2015  . Obesity (BMI 30-39.9) 01/23/2013  . Fever 01/19/2013  . Hyperlipidemia 10/31/2011  . POSTTRAUMATIC STRESS DISORDER 11/11/2009  . OTHER ACUTE REACTIONS TO STRESS 05/05/2009  . SCIATICA, RIGHT 01/21/2008  . GERD 07/22/2007  . FATIGUE 07/22/2007  . DEPRESSION 07/17/2007  . KNEE PAIN, RIGHT 05/09/2007  . ANXIETY 05/06/2007  . MIGRAINE HEADACHE 03/10/2007  . Morbid obesity (HCC) 08/23/2006  . CYST, SEBACEOUS 08/23/2006  . HYPOTHYROIDISM 03/12/2006  . Essential hypertension 03/12/2006    PAYSEUR,ANGIE PTA 03/20/2016, 9:30 AM  [facility] [address] [place], [place], [address] Phone: [phone]   Fax:  [phone]  Name: Kristin Hubbard MRN: [id number] Date of Birth: Sep 29, 1960

## 2016-03-21 ENCOUNTER — Encounter: Payer: Self-pay | Admitting: Physical Therapy

## 2016-03-21 ENCOUNTER — Ambulatory Visit: Payer: PRIVATE HEALTH INSURANCE | Admitting: Physical Therapy

## 2016-03-21 DIAGNOSIS — M25561 Pain in right knee: Secondary | ICD-10-CM

## 2016-03-21 DIAGNOSIS — R262 Difficulty in walking, not elsewhere classified: Secondary | ICD-10-CM

## 2016-03-21 DIAGNOSIS — M25661 Stiffness of right knee, not elsewhere classified: Secondary | ICD-10-CM

## 2016-03-21 DIAGNOSIS — M25562 Pain in left knee: Secondary | ICD-10-CM

## 2016-03-21 NOTE — Therapy (Signed)
[facility] [address] [place], [place], [address] Phone: [phone]   Fax:  [phone]  Physical Therapy Treatment  Patient Details  Name: Kristin AzureRobin Hubbard MRN: [id number] Date of Birth: Sep 30, 1960 Referring Provider: Dion SaucierLandau  Encounter Date: 03/21/2016      PT End of Session - 03/21/16 0927    Visit Number 10   Date for PT Re-Evaluation 04/20/16   PT Start Time 0845   PT Stop Time 0940   PT Time Calculation (min) 55 min   Activity Tolerance Patient tolerated treatment well;Patient limited by fatigue   Behavior During Therapy [facility] for tasks assessed/performed      Past Medical History:  Diagnosis Date  . Anxiety   . Bilateral primary osteoarthritis of knee 01/31/2016  . Complication of anesthesia    after 1 st surgery was combative,other surgeries have been fine  . Hypertension   . Hypothyroidism   . Migraine   . Thyroid disease    Hypothyroidism    Past Surgical History:  Procedure Laterality Date  . BUNIONECTOMY     hammer toe  . CHOLECYSTECTOMY    . KNEE SURGERY  05/2007   left  . TOTAL KNEE ARTHROPLASTY Bilateral 01/31/2016   Procedure: TOTAL KNEE BILATERAL;  Surgeon: Teryl LucyJoshua Landau, MD;  Location: [facility] OR;  Service: Orthopedics;  Laterality: Bilateral;    There were no vitals filed for this visit.      Subjective Assessment - 03/21/16 0849    Subjective "I was just here yesterday so my quads are a little sore"   Currently in Pain? Yes   Pain Score 6    Pain Location --  Soreness quads                         [facility] Adult PT Treatment/Exercise - 03/21/16 0001      Knee/Hip Exercises: Aerobic   Nustep level 6x756min     Knee/Hip Exercises: Machines for Strengthening   Cybex Knee Extension 10# 3 sets 15    Cybex Knee Flexion 45 # 3 sets 15   Cybex Leg Press 70# 15 times, SL 10 each      Knee/Hip Exercises: Standing   Lateral Step Up Both;1 set;10 reps;Hand Hold: 1;Step Height:  6"   Forward Step Up Both;10 reps;Hand Hold: 1;Step Height: 6";2 sets     Cryotherapy   Number Minutes Cryotherapy 15 Minutes   Cryotherapy Location Knee   Type of Cryotherapy Ice pack     Vasopneumatic   Number Minutes Vasopneumatic  15 minutes   Vasopnuematic Location  Knee   Vasopneumatic Pressure Medium                  PT Short Term Goals - 02/22/16 [id number]      PT SHORT TERM GOAL #1   Title independent with initial HEP   Time 2   Period Weeks   Status New           PT Long Term Goals - 03/13/16 1619      PT LONG TERM GOAL #1   Title decrease pain 50%   Status On-going     PT LONG TERM GOAL #2   Title increase AROM of the knees to 0-115 degrees flexion bilaterally   Status On-going     PT LONG TERM GOAL #3   Title walk 500 feet without stopping   Baseline edcuated on walking program at home to  build endurance   Status On-going     PT LONG TERM GOAL #4   Title ambulate without deviation with a SPC or less device   Status On-going               Plan - 03/21/16 0927    Clinical Impression Statement Pt continues to be limited by fatigue mostly with functional interventions requiring frequent seated rest breaks. demos good strength and ROM with machine level interventions.    Rehab Potential Good   PT Duration 8 weeks   PT Treatment/Interventions ADLs/Self Care Home Management;Cryotherapy;Moist Heat;Gait training;Functional mobility training;Patient/family education;Balance training;Therapeutic exercise;Therapeutic activities;Manual techniques;Vasopneumatic Device   PT Next Visit Plan strength and ROM, check ROM, 11th visit due to pt reports on 10th      Patient will benefit from skilled therapeutic intervention in order to improve the following deficits and impairments:  Abnormal gait, Decreased activity tolerance, Decreased balance, Decreased mobility, Decreased range of motion, Increased edema, Difficulty walking, Impaired flexibility,  Pain  Visit Diagnosis: Acute pain of right knee  Acute pain of left knee  Stiffness of right knee, not elsewhere classified  Difficulty in walking, not elsewhere classified     Problem List Patient Active Problem List   Diagnosis Date Noted  . Bilateral primary osteoarthritis of knee 01/31/2016  . S/P total knee arthroplasty, bilateral 01/31/2016  . RBBB 12/30/2015  . Obesity (BMI 30-39.9) 01/23/2013  . Fever 01/19/2013  . Hyperlipidemia 10/31/2011  . POSTTRAUMATIC STRESS DISORDER 11/11/2009  . OTHER ACUTE REACTIONS TO STRESS 05/05/2009  . SCIATICA, RIGHT 01/21/2008  . GERD 07/22/2007  . FATIGUE 07/22/2007  . DEPRESSION 07/17/2007  . KNEE PAIN, RIGHT 05/09/2007  . ANXIETY 05/06/2007  . MIGRAINE HEADACHE 03/10/2007  . Morbid obesity (HCC) 08/23/2006  . CYST, SEBACEOUS 08/23/2006  . HYPOTHYROIDISM 03/12/2006  . Essential hypertension 03/12/2006    Grayce Sessions, PTA 03/21/2016, 9:30 AM  [facility] [address] [place], [place], [address] Phone: [phone]   Fax:  [phone]  Name: Kristin Hubbard MRN: [id number] Date of Birth: August 16, 1960

## 2016-03-23 ENCOUNTER — Ambulatory Visit: Payer: PRIVATE HEALTH INSURANCE | Attending: Orthopedic Surgery | Admitting: Physical Therapy

## 2016-03-23 DIAGNOSIS — M25561 Pain in right knee: Secondary | ICD-10-CM | POA: Diagnosis not present

## 2016-03-23 DIAGNOSIS — M25661 Stiffness of right knee, not elsewhere classified: Secondary | ICD-10-CM | POA: Diagnosis present

## 2016-03-23 DIAGNOSIS — M25562 Pain in left knee: Secondary | ICD-10-CM | POA: Insufficient documentation

## 2016-03-23 DIAGNOSIS — R2243 Localized swelling, mass and lump, lower limb, bilateral: Secondary | ICD-10-CM | POA: Diagnosis present

## 2016-03-23 DIAGNOSIS — R262 Difficulty in walking, not elsewhere classified: Secondary | ICD-10-CM | POA: Diagnosis present

## 2016-03-23 DIAGNOSIS — M25662 Stiffness of left knee, not elsewhere classified: Secondary | ICD-10-CM | POA: Diagnosis present

## 2016-03-23 NOTE — Therapy (Signed)
[facility] [address] [place], [place], [address] Phone: [phone]   Fax:  [phone]  Physical Therapy Treatment  Patient Details  Name: Kristin Hubbard MRN: [id number] Date of Birth: Jun 12, 1960 Referring Provider: Dion Saucier  Encounter Date: 03/23/2016      PT End of Session - 03/23/16 0851    Visit Number 11   Date for PT Re-Evaluation 04/20/16   PT Start Time 0846   PT Stop Time 0942   PT Time Calculation (min) 56 min   Activity Tolerance Patient tolerated treatment well;Patient limited by fatigue   Behavior During Therapy [facility] for tasks assessed/performed      Past Medical History:  Diagnosis Date  . Anxiety   . Bilateral primary osteoarthritis of knee 01/31/2016  . Complication of anesthesia    after 1 st surgery was combative,other surgeries have been fine  . Hypertension   . Hypothyroidism   . Migraine   . Thyroid disease    Hypothyroidism    Past Surgical History:  Procedure Laterality Date  . BUNIONECTOMY     hammer toe  . CHOLECYSTECTOMY    . KNEE SURGERY  05/2007   left  . TOTAL KNEE ARTHROPLASTY Bilateral 01/31/2016   Procedure: TOTAL KNEE BILATERAL;  Surgeon: Teryl Lucy, MD;  Location: [facility] OR;  Service: Orthopedics;  Laterality: Bilateral;    There were no vitals filed for this visit.      Subjective Assessment - 03/23/16 0848    Subjective Pt. reporting steps are still most difficult at this point with daily activities.     Patient Stated Goals walk and have less pain   Currently in Pain? Yes   Pain Score 3    Pain Location Knee   Pain Orientation Right;Left   Multiple Pain Sites No            [facility] PT Assessment - 03/23/16 0856      AROM   Right/Left Knee Right;Left   Right Knee Extension 0  seated    Right Knee Flexion 112  supine    Left Knee Extension 3  seated   Left Knee Flexion 113  supine             [facility] Adult PT Treatment/Exercise - 03/23/16 0855       Knee/Hip Exercises: Machines for Strengthening   Cybex Knee Extension 15# 2 sets 20 reps   Cybex Knee Flexion 45 # 2 sets 20  2nd set at 55#    Cybex Leg Press 70# 20 times, B SL 2 x 10 each reps      Knee/Hip Exercises: Standing   Lateral Step Up Both;1 set;10 reps;Hand Hold: 1;Step Height: 6"  at treadmill    Forward Step Up Both;10 reps;Hand Hold: 1;Step Height: 6";2 sets  at treadmill    Other Standing Knee Exercises Alternating lunge stretch onto treadmill 5" x 10 reps each leg      Knee/Hip Exercises: Seated   Long Arc Quad Both;15 reps   Long Arc Quad Weight 3 lbs.   Long [employer] Limitations with adduction ball squeeze     Cryotherapy   Number Minutes Cryotherapy 15 Minutes   Cryotherapy Location Knee  L knee   Type of Cryotherapy Ice pack     Vasopneumatic   Number Minutes Vasopneumatic  15 minutes   Vasopnuematic Location  Knee  R   Vasopneumatic Pressure Medium  PT Short Term Goals - 03/23/16 0930      PT SHORT TERM GOAL #1   Title independent with initial HEP   Time 2   Period Weeks   Status On-going           PT Long Term Goals - 03/13/16 1619      PT LONG TERM GOAL #1   Title decrease pain 50%   Status On-going     PT LONG TERM GOAL #2   Title increase AROM of the knees to 0-115 degrees flexion bilaterally   Status On-going     PT LONG TERM GOAL #3   Title walk 500 feet without stopping   Baseline edcuated on walking program at home to build endurance   Status On-going     PT LONG TERM GOAL #4   Title ambulate without deviation with a SPC or less device   Status On-going               Plan - 03/23/16 0930    Clinical Impression Statement Pt. progressing with strengthening activity today and able to demo mild ROM improvement in B knees.  Pt. still reporting benefit from ice/compression thus continued to end treatment today.  Pt. will continue to benefit from further skilled therapy to improve ROM, strength, and  function.   PT Treatment/Interventions ADLs/Self Care Home Management;Cryotherapy;Moist Heat;Gait training;Functional mobility training;Patient/family education;Balance training;Therapeutic exercise;Therapeutic activities;Manual techniques;Vasopneumatic Device   PT Next Visit Plan strength and ROM       Patient will benefit from skilled therapeutic intervention in order to improve the following deficits and impairments:  Abnormal gait, Decreased activity tolerance, Decreased balance, Decreased mobility, Decreased range of motion, Increased edema, Difficulty walking, Impaired flexibility, Pain  Visit Diagnosis: Acute pain of right knee  Acute pain of left knee  Stiffness of right knee, not elsewhere classified  Difficulty in walking, not elsewhere classified  Stiffness of left knee, not elsewhere classified  Localized swelling, mass and lump, lower limb, bilateral     Problem List Patient Active Problem List   Diagnosis Date Noted  . Bilateral primary osteoarthritis of knee 01/31/2016  . S/P total knee arthroplasty, bilateral 01/31/2016  . RBBB 12/30/2015  . Obesity (BMI 30-39.9) 01/23/2013  . Fever 01/19/2013  . Hyperlipidemia 10/31/2011  . POSTTRAUMATIC STRESS DISORDER 11/11/2009  . OTHER ACUTE REACTIONS TO STRESS 05/05/2009  . SCIATICA, RIGHT 01/21/2008  . GERD 07/22/2007  . FATIGUE 07/22/2007  . DEPRESSION 07/17/2007  . KNEE PAIN, RIGHT 05/09/2007  . ANXIETY 05/06/2007  . MIGRAINE HEADACHE 03/10/2007  . Morbid obesity (HCC) 08/23/2006  . CYST, SEBACEOUS 08/23/2006  . HYPOTHYROIDISM 03/12/2006  . Essential hypertension 03/12/2006   Kermit BaloMicah Adin Laker, PTA 03/23/16 12:32 PM  [facility] [address] [place], [place], [address] Phone: [phone]   Fax:  [phone]  Name: Kristin AzureRobin Hubbard MRN: [id number] Date of Birth: 16-Jun-1960

## 2016-03-26 ENCOUNTER — Ambulatory Visit: Payer: PRIVATE HEALTH INSURANCE | Admitting: Physical Therapy

## 2016-03-26 ENCOUNTER — Encounter: Payer: Self-pay | Admitting: Physical Therapy

## 2016-03-26 DIAGNOSIS — R262 Difficulty in walking, not elsewhere classified: Secondary | ICD-10-CM

## 2016-03-26 DIAGNOSIS — M25661 Stiffness of right knee, not elsewhere classified: Secondary | ICD-10-CM

## 2016-03-26 DIAGNOSIS — M25561 Pain in right knee: Secondary | ICD-10-CM

## 2016-03-26 DIAGNOSIS — M25562 Pain in left knee: Secondary | ICD-10-CM

## 2016-03-26 NOTE — Therapy (Signed)
[facility] [address] [place], [place], [address] Phone: [phone]   Fax:  [phone]  Physical Therapy Treatment  Patient Details  Name: Kristin Hubbard MRN: [id number] Date of Birth: 1960-08-18 Referring Provider: Dion SaucierLandau  Encounter Date: 03/26/2016      PT End of Session - 03/26/16 0924    Visit Number 12   Date for PT Re-Evaluation 04/20/16   PT Start Time 0845   PT Stop Time 0940   PT Time Calculation (min) 55 min   Activity Tolerance Patient tolerated treatment well;Patient limited by fatigue   Behavior During Therapy [facility] for tasks assessed/performed      Past Medical History:  Diagnosis Date  . Anxiety   . Bilateral primary osteoarthritis of knee 01/31/2016  . Complication of anesthesia    after 1 st surgery was combative,other surgeries have been fine  . Hypertension   . Hypothyroidism   . Migraine   . Thyroid disease    Hypothyroidism    Past Surgical History:  Procedure Laterality Date  . BUNIONECTOMY     hammer toe  . CHOLECYSTECTOMY    . KNEE SURGERY  05/2007   left  . TOTAL KNEE ARTHROPLASTY Bilateral 01/31/2016   Procedure: TOTAL KNEE BILATERAL;  Surgeon: Teryl LucyJoshua Landau, MD;  [facility] OR;  Service: Orthopedics;  Laterality: Bilateral;    There were no vitals filed for this visit.      Subjective Assessment - 03/26/16 0847    Subjective "Im  here"   Currently in Pain? Yes   Pain Score 2    Pain Location Knee   Pain Orientation Left;Right                         [facility] Adult PT Treatment/Exercise - 03/26/16 0001      Knee/Hip Exercises: Aerobic   Elliptical 3 min I 5 R 5   Nustep level 6x286min     Knee/Hip Exercises: Machines for Strengthening   Cybex Knee Extension 20# 2 sets 15 reps   Cybex Knee Flexion 55 # 2 sets 20   Cybex Leg Press 70# 2x15, B SL 30lb x10 each, heel raises 50lb2x15     Knee/Hip Exercises: Standing   Lateral Step Up Both;1 set;10  reps;Hand Hold: 1;Step Height: 6"   Forward Step Up Both;10 reps;Hand Hold: 1;Step Height: 6";1 set   Other Standing Knee Exercises Stnading march      Knee/Hip Exercises: Seated   Sit to Sand 2 sets;10 reps;without UE support  holding red ball      Cryotherapy   Number Minutes Cryotherapy 15 Minutes   Cryotherapy Location Knee   Type of Cryotherapy Ice pack     Vasopneumatic   Number Minutes Vasopneumatic  15 minutes   Vasopnuematic Location  Knee   Vasopneumatic Pressure Medium                  PT Short Term Goals - 03/23/16 0930      PT SHORT TERM GOAL #1   Title independent with initial HEP   Time 2   Period Weeks   Status On-going           PT Long Term Goals - 03/13/16 1619      PT LONG TERM GOAL #1   Title decrease pain 50%   Status On-going     PT LONG TERM GOAL #2   Title increase AROM of the knees  to 0-115 degrees flexion bilaterally   Status On-going     PT LONG TERM GOAL #3   Title walk 500 feet without stopping   Baseline edcuated on walking program at home to build endurance   Status On-going     PT LONG TERM GOAL #4   Title ambulate without deviation with a SPC or less device   Status On-going               Plan - 03/26/16 0925    Clinical Impression Statement Pt with a limited by fatigue with functional interventions such as step up sand sit to stand, requiring frequent rest breaks. Tolerated added weight with leg press and seated curls and extensions.   Rehab Potential Good   PT Frequency 3x / week   PT Duration 8 weeks   PT Treatment/Interventions ADLs/Self Care Home Management;Cryotherapy;Moist Heat;Gait training;Functional mobility training;Patient/family education;Balance training;Therapeutic exercise;Therapeutic activities;Manual techniques;Vasopneumatic Device   PT Next Visit Plan strength and ROM       Patient will benefit from skilled therapeutic intervention in order to improve the following deficits and  impairments:  Abnormal gait, Decreased activity tolerance, Decreased balance, Decreased mobility, Decreased range of motion, Increased edema, Difficulty walking, Impaired flexibility, Pain  Visit Diagnosis: Acute pain of right knee  Acute pain of left knee  Difficulty in walking, not elsewhere classified  Stiffness of right knee, not elsewhere classified     Problem List Patient Active Problem List   Diagnosis Date Noted  . Bilateral primary osteoarthritis of knee 01/31/2016  . S/P total knee arthroplasty, bilateral 01/31/2016  . RBBB 12/30/2015  . Obesity (BMI 30-39.9) 01/23/2013  . Fever 01/19/2013  . Hyperlipidemia 10/31/2011  . POSTTRAUMATIC STRESS DISORDER 11/11/2009  . OTHER ACUTE REACTIONS TO STRESS 05/05/2009  . SCIATICA, RIGHT 01/21/2008  . GERD 07/22/2007  . FATIGUE 07/22/2007  . DEPRESSION 07/17/2007  . KNEE PAIN, RIGHT 05/09/2007  . ANXIETY 05/06/2007  . MIGRAINE HEADACHE 03/10/2007  . Morbid obesity (HCC) 08/23/2006  . CYST, SEBACEOUS 08/23/2006  . HYPOTHYROIDISM 03/12/2006  . Essential hypertension 03/12/2006    Kristin Hubbard, PTA 03/26/2016, 9:26 AM  [facility] [place] [facility] [address] [place], [place], [address] Phone: [phone]   Fax:  [phone]  Name: Kristin Hubbard MRN: [id number] Date of Birth: [birth year]

## 2016-03-28 ENCOUNTER — Ambulatory Visit: Payer: PRIVATE HEALTH INSURANCE | Admitting: Physical Therapy

## 2016-03-28 ENCOUNTER — Encounter: Payer: Self-pay | Admitting: Physical Therapy

## 2016-03-28 DIAGNOSIS — M25561 Pain in right knee: Secondary | ICD-10-CM

## 2016-03-28 DIAGNOSIS — R2243 Localized swelling, mass and lump, lower limb, bilateral: Secondary | ICD-10-CM

## 2016-03-28 DIAGNOSIS — M25662 Stiffness of left knee, not elsewhere classified: Secondary | ICD-10-CM

## 2016-03-28 DIAGNOSIS — M25661 Stiffness of right knee, not elsewhere classified: Secondary | ICD-10-CM

## 2016-03-28 DIAGNOSIS — R262 Difficulty in walking, not elsewhere classified: Secondary | ICD-10-CM

## 2016-03-28 DIAGNOSIS — M25562 Pain in left knee: Secondary | ICD-10-CM

## 2016-03-28 NOTE — Therapy (Signed)
[facility] [address] [place], [place], [address] Phone: [phone]   Fax:  [phone]  Physical Therapy Treatment  Patient Details  Name: Kristin Hubbard MRN: [id number] Date of Birth: July 29, 1960 Referring Provider: Dion SaucierLandau  Encounter Date: 03/28/2016      PT End of Session - 03/28/16 0930    Visit Number 13   Date for PT Re-Evaluation 04/20/16   PT Start Time 0845   PT Stop Time 0942   PT Time Calculation (min) 57 min      Past Medical History:  Diagnosis Date  . Anxiety   . Bilateral primary osteoarthritis of knee 01/31/2016  . Complication of anesthesia    after 1 st surgery was combative,other surgeries have been fine  . Hypertension   . Hypothyroidism   . Migraine   . Thyroid disease    Hypothyroidism    Past Surgical History:  Procedure Laterality Date  . BUNIONECTOMY     hammer toe  . CHOLECYSTECTOMY    . KNEE SURGERY  05/2007   left  . TOTAL KNEE ARTHROPLASTY Bilateral 01/31/2016   Procedure: TOTAL KNEE BILATERAL;  Surgeon: Teryl LucyJoshua Landau, MD;  Location: [facility] OR;  Service: Orthopedics;  Laterality: Bilateral;    There were no vitals filed for this visit.      Subjective Assessment - 03/28/16 0850    Subjective Pt reports that last night she stood up and lost balance, she stated  she did not fall but her L leg slid out so she had to use her RLE to brace herself. Now is reporting some tightness I her R knee.   Currently in Pain? Yes   Pain Score 6    Pain Location Knee   Pain Orientation Right            [facility] PT Assessment - 03/28/16 0001      AROM   Right/Left Knee Right;Left   Right Knee Extension 0   Right Knee Flexion 108   Left Knee Extension 2   Left Knee Flexion 112                     [facility] Adult PT Treatment/Exercise - 03/28/16 0001      Knee/Hip Exercises: Aerobic   Nustep level 6x386min     Knee/Hip Exercises: Machines for Strengthening   Cybex Knee  Extension 20# 2 sets 15 reps; SL 10lb x10 each   Cybex Knee Flexion 55 # 2 sets 15; SL 20 B x10 each    Cybex Leg Press 80lb 3x10; SL 20lb x10 each   Other Machine Fitter presses 2 black x15 each     Knee/Hip Exercises: Seated   Sit to Sand 2 sets;10 reps;without UE support  seated on sit fit for 1st set                   PT Short Term Goals - 03/23/16 0930      PT SHORT TERM GOAL #1   Title independent with initial HEP   Time 2   Period Weeks   Status On-going           PT Long Term Goals - 03/13/16 1619      PT LONG TERM GOAL #1   Title decrease pain 50%   Status On-going     PT LONG TERM GOAL #2   Title increase AROM of the knees to 0-115 degrees flexion bilaterally   Status On-going  PT LONG TERM GOAL #3   Title walk 500 feet without stopping   Baseline edcuated on walking program at home to build endurance   Status On-going     PT LONG TERM GOAL #4   Title ambulate without deviation with a SPC or less device   Status On-going               Plan - 03/28/16 0931    Clinical Impression Statement Pt tolerated all machine level interventions well. She was able to progress to more single leg isolation exercises. Reports burning in quads with led extensions. Tolerated more with with leg press.   Rehab Potential Poor   PT Frequency 3x / week   PT Duration 8 weeks   PT Next Visit Plan strength and ROM       Patient will benefit from skilled therapeutic intervention in order to improve the following deficits and impairments:  Abnormal gait, Decreased activity tolerance, Decreased balance, Decreased mobility, Decreased range of motion, Increased edema, Difficulty walking, Impaired flexibility, Pain  Visit Diagnosis: Acute pain of right knee  Acute pain of left knee  Stiffness of right knee, not elsewhere classified  Difficulty in walking, not elsewhere classified  Stiffness of left knee, not elsewhere classified  Localized swelling,  mass and lump, lower limb, bilateral     Problem List Patient Active Problem List   Diagnosis Date Noted  . Bilateral primary osteoarthritis of knee 01/31/2016  . S/P total knee arthroplasty, bilateral 01/31/2016  . RBBB 12/30/2015  . Obesity (BMI 30-39.9) 01/23/2013  . Fever 01/19/2013  . Hyperlipidemia 10/31/2011  . POSTTRAUMATIC STRESS DISORDER 11/11/2009  . OTHER ACUTE REACTIONS TO STRESS 05/05/2009  . SCIATICA, RIGHT 01/21/2008  . GERD 07/22/2007  . FATIGUE 07/22/2007  . DEPRESSION 07/17/2007  . KNEE PAIN, RIGHT 05/09/2007  . ANXIETY 05/06/2007  . MIGRAINE HEADACHE 03/10/2007  . Morbid obesity (HCC) 08/23/2006  . CYST, SEBACEOUS 08/23/2006  . HYPOTHYROIDISM 03/12/2006  . Essential hypertension 03/12/2006    Grayce Sessions, PTA 03/28/2016, 9:35 AM  [facility] [address] [place], [place], [address] Phone: [phone]   Fax:  [phone]  Name: Kristin Hubbard MRN: [id number] Date of Birth: September 01, 1960

## 2016-03-30 ENCOUNTER — Encounter: Payer: Self-pay | Admitting: Physical Therapy

## 2016-03-30 ENCOUNTER — Ambulatory Visit: Payer: PRIVATE HEALTH INSURANCE | Admitting: Physical Therapy

## 2016-03-30 DIAGNOSIS — M25661 Stiffness of right knee, not elsewhere classified: Secondary | ICD-10-CM

## 2016-03-30 DIAGNOSIS — M25662 Stiffness of left knee, not elsewhere classified: Secondary | ICD-10-CM

## 2016-03-30 DIAGNOSIS — R262 Difficulty in walking, not elsewhere classified: Secondary | ICD-10-CM

## 2016-03-30 DIAGNOSIS — M25562 Pain in left knee: Secondary | ICD-10-CM

## 2016-03-30 DIAGNOSIS — M25561 Pain in right knee: Secondary | ICD-10-CM

## 2016-03-30 DIAGNOSIS — R2243 Localized swelling, mass and lump, lower limb, bilateral: Secondary | ICD-10-CM

## 2016-03-30 NOTE — Therapy (Signed)
[facility] [place] [facility] [address] [place], [place], [address] Phone: [phone]   Fax:  [phone]  Physical Therapy Treatment  Patient Details  Name: Kristin Hubbard MRN: [id number] Date of Birth: 08-Oct-1960 Referring Provider: Dion Saucier  Encounter Date: 03/30/2016      PT End of Session - 03/30/16 0927    Visit Number 14   Date for PT Re-Evaluation 04/20/16   PT Start Time 0845   PT Stop Time 0950   PT Time Calculation (min) 65 min      Past Medical History:  Diagnosis Date  . Anxiety   . Bilateral primary osteoarthritis of knee 01/31/2016  . Complication of anesthesia    after 1 st surgery was combative,other surgeries have been fine  . Hypertension   . Hypothyroidism   . Migraine   . Thyroid disease    Hypothyroidism    Past Surgical History:  Procedure Laterality Date  . BUNIONECTOMY     hammer toe  . CHOLECYSTECTOMY    . KNEE SURGERY  05/2007   left  . TOTAL KNEE ARTHROPLASTY Bilateral 01/31/2016   Procedure: TOTAL KNEE BILATERAL;  Surgeon: Teryl Lucy, MD;  Location: [facility] OR;  Service: Orthopedics;  Laterality: Bilateral;    There were no vitals filed for this visit.      Subjective Assessment - 03/30/16 0848    Subjective okay but tight   Currently in Pain? Yes   Pain Score 5    Pain Location Knee                         [facility] Adult PT Treatment/Exercise - 03/30/16 0001      Knee/Hip Exercises: Aerobic   Nustep level 6x54min     Knee/Hip Exercises: Machines for Strengthening   Cybex Knee Extension 20# 2 sets 15 reps; SL 10lb x10 each   Cybex Knee Flexion 55 # 2 sets 15; SL 20 B x15 each    Cybex Leg Press 90 lb 3x10; SL 30lb x10 each     Knee/Hip Exercises: Standing   Functional Squat 2 sets;10 reps  resisted   Other Standing Knee Exercises walking high knees red tband 20 feet 4 times each   Other Standing Knee Exercises side step with squat red tband 20 feet 2 times each way     Cryotherapy   Number Minutes Cryotherapy 15 Minutes   Cryotherapy Location Knee   Type of Cryotherapy Ice pack     Vasopneumatic   Number Minutes Vasopneumatic  15 minutes   Vasopnuematic Location  Knee   Vasopneumatic Pressure Medium                  PT Short Term Goals - 03/23/16 0930      PT SHORT TERM GOAL #1   Title independent with initial HEP   Time 2   Period Weeks   Status On-going           PT Long Term Goals - 03/13/16 1619      PT LONG TERM GOAL #1   Title decrease pain 50%   Status On-going     PT LONG TERM GOAL #2   Title increase AROM of the knees to 0-115 degrees flexion bilaterally   Status On-going     PT LONG TERM GOAL #3   Title walk 500 feet without stopping   Baseline edcuated on walking program at home to build endurance  Status On-going     PT LONG TERM GOAL #4   Title ambulate without deviation with a SPC or less device   Status On-going               Plan - 03/30/16 0927    Clinical Impression Statement pt very fearful of falling and afraid of advanced therapy ex progressions in standing. improved knee flexion with gait, still amb with walking stick   PT Next Visit Plan fucn strength      Patient will benefit from skilled therapeutic intervention in order to improve the following deficits and impairments:  Abnormal gait, Decreased activity tolerance, Decreased balance, Decreased mobility, Decreased range of motion, Increased edema, Difficulty walking, Impaired flexibility, Pain  Visit Diagnosis: Acute pain of right knee  Acute pain of left knee  Stiffness of right knee, not elsewhere classified  Difficulty in walking, not elsewhere classified  Stiffness of left knee, not elsewhere classified  Localized swelling, mass and lump, lower limb, bilateral     Problem List Patient Active Problem List   Diagnosis Date Noted  . Bilateral primary osteoarthritis of knee 01/31/2016  . S/P total knee  arthroplasty, bilateral 01/31/2016  . RBBB 12/30/2015  . Obesity (BMI 30-39.9) 01/23/2013  . Fever 01/19/2013  . Hyperlipidemia 10/31/2011  . POSTTRAUMATIC STRESS DISORDER 11/11/2009  . OTHER ACUTE REACTIONS TO STRESS 05/05/2009  . SCIATICA, RIGHT 01/21/2008  . GERD 07/22/2007  . FATIGUE 07/22/2007  . DEPRESSION 07/17/2007  . KNEE PAIN, RIGHT 05/09/2007  . ANXIETY 05/06/2007  . MIGRAINE HEADACHE 03/10/2007  . Morbid obesity (HCC) 08/23/2006  . CYST, SEBACEOUS 08/23/2006  . HYPOTHYROIDISM 03/12/2006  . Essential hypertension 03/12/2006    PAYSEUR,ANGIE PTA 03/30/2016, 9:30 AM  [facility] [address] [place], [place], [address] Phone: [phone]   Fax:  [phone]  Name: Kristin Hubbard MRN: [id number] Date of Birth: [birth year]

## 2016-04-02 ENCOUNTER — Ambulatory Visit: Payer: PRIVATE HEALTH INSURANCE | Admitting: Physical Therapy

## 2016-04-02 ENCOUNTER — Encounter: Payer: Self-pay | Admitting: Physical Therapy

## 2016-04-02 DIAGNOSIS — R2243 Localized swelling, mass and lump, lower limb, bilateral: Secondary | ICD-10-CM

## 2016-04-02 DIAGNOSIS — M25561 Pain in right knee: Secondary | ICD-10-CM

## 2016-04-02 DIAGNOSIS — M25562 Pain in left knee: Secondary | ICD-10-CM

## 2016-04-02 DIAGNOSIS — M25662 Stiffness of left knee, not elsewhere classified: Secondary | ICD-10-CM

## 2016-04-02 DIAGNOSIS — M25661 Stiffness of right knee, not elsewhere classified: Secondary | ICD-10-CM

## 2016-04-02 NOTE — Therapy (Signed)
[facility] [place] [facility] [address] [place], [place], [address] Phone: [phone]   Fax:  [phone]  Physical Therapy Treatment  Patient Details  Name: Kristin Hubbard MRN: [id number] Date of Birth: 1960/03/10 Referring Provider: Dion Saucier  Encounter Date: 04/02/2016      PT End of Session - 04/02/16 1346    Activity Tolerance Patient tolerated treatment well   Behavior During Therapy [facility] for tasks assessed/performed      Past Medical History:  Diagnosis Date  . Anxiety   . Bilateral primary osteoarthritis of knee 01/31/2016  . Complication of anesthesia    after 1 st surgery was combative,other surgeries have been fine  . Hypertension   . Hypothyroidism   . Migraine   . Thyroid disease    Hypothyroidism    Past Surgical History:  Procedure Laterality Date  . BUNIONECTOMY     hammer toe  . CHOLECYSTECTOMY    . KNEE SURGERY  05/2007   left  . TOTAL KNEE ARTHROPLASTY Bilateral 01/31/2016   Procedure: TOTAL KNEE BILATERAL;  Surgeon: Teryl Lucy, MD;  Location: [facility] OR;  Service: Orthopedics;  Laterality: Bilateral;    There were no vitals filed for this visit.      Subjective Assessment - 04/02/16 1259    Subjective "Pretty good right now" Pt reports that she has started back with her trainer    Currently in Pain? Yes   Pain Score 2    Pain Location Knee   Pain Orientation Left;Right                         [facility] Adult PT Treatment/Exercise - 04/02/16 0001      High Level Balance   High Level Balance Activities Side stepping;Backward walking     Knee/Hip Exercises: Aerobic   Nustep level 6x66min     Knee/Hip Exercises: Machines for Strengthening   Cybex Knee Extension SL 15lb 2x10 each   Cybex Knee Flexion SL 20 B x15 each    Cybex Leg Press SL 25lb 2x10 each     Knee/Hip Exercises: Standing   Forward Step Up Both;10 reps;Hand Hold: 1;Step Height: 6";1 set   Other Standing Knee Exercises  Standing march 2x10     Knee/Hip Exercises: Seated   Sit to Sand 2 sets;10 reps;without UE support     Cryotherapy   Number Minutes Cryotherapy 15 Minutes   Cryotherapy Location Knee   Type of Cryotherapy Ice pack     Vasopneumatic   Number Minutes Vasopneumatic  15 minutes   Vasopnuematic Location  Knee   Vasopneumatic Pressure Medium   Vasopneumatic Temperature  32                  PT Short Term Goals - 03/23/16 0930      PT SHORT TERM GOAL #1   Title independent with initial HEP   Time 2   Period Weeks   Status On-going           PT Long Term Goals - 03/13/16 1619      PT LONG TERM GOAL #1   Title decrease pain 50%   Status On-going     PT LONG TERM GOAL #2   Title increase AROM of the knees to 0-115 degrees flexion bilaterally   Status On-going     PT LONG TERM GOAL #3   Title walk 500 feet without stopping   Baseline edcuated on walking program  at home to build endurance   Status On-going     PT LONG TERM GOAL #4   Title ambulate without deviation with a SPC or less device   Status On-going               Plan - 04/02/16 1346    Clinical Impression Statement Pt continues to be fearful of falling with balance interventions. Progressing well with strength interventions tolerating increase resistance.    Rehab Potential Poor   PT Frequency 3x / week   PT Duration 8 weeks   PT Treatment/Interventions ADLs/Self Care Home Management;Cryotherapy;Moist Heat;Gait training;Functional mobility training;Patient/family education;Balance training;Therapeutic exercise;Therapeutic activities;Manual techniques;Vasopneumatic Device   PT Next Visit Plan fucn strength      Patient will benefit from skilled therapeutic intervention in order to improve the following deficits and impairments:  Abnormal gait, Decreased activity tolerance, Decreased balance, Decreased mobility, Decreased range of motion, Increased edema, Difficulty walking, Impaired  flexibility, Pain  Visit Diagnosis: Acute pain of right knee  Acute pain of left knee  Stiffness of right knee, not elsewhere classified  Localized swelling, mass and lump, lower limb, bilateral  Stiffness of left knee, not elsewhere classified     Problem List Patient Active Problem List   Diagnosis Date Noted  . Bilateral primary osteoarthritis of knee 01/31/2016  . S/P total knee arthroplasty, bilateral 01/31/2016  . RBBB 12/30/2015  . Obesity (BMI 30-39.9) 01/23/2013  . Fever 01/19/2013  . Hyperlipidemia 10/31/2011  . POSTTRAUMATIC STRESS DISORDER 11/11/2009  . OTHER ACUTE REACTIONS TO STRESS 05/05/2009  . SCIATICA, RIGHT 01/21/2008  . GERD 07/22/2007  . FATIGUE 07/22/2007  . DEPRESSION 07/17/2007  . KNEE PAIN, RIGHT 05/09/2007  . ANXIETY 05/06/2007  . MIGRAINE HEADACHE 03/10/2007  . Morbid obesity (HCC) 08/23/2006  . CYST, SEBACEOUS 08/23/2006  . HYPOTHYROIDISM 03/12/2006  . Essential hypertension 03/12/2006    Grayce Sessionsonald G Sheyenne Konz, PTA 04/02/2016, 1:50 PM  [facility] [place] [address] [place], [place], [address] Phone: [phone]   Fax:  [phone]  Name: Kristin AzureRobin Hubbard MRN: [id number] Date of Birth: July 12, 1960

## 2016-04-04 ENCOUNTER — Encounter: Payer: Self-pay | Admitting: Physical Therapy

## 2016-04-04 ENCOUNTER — Ambulatory Visit: Payer: PRIVATE HEALTH INSURANCE | Admitting: Physical Therapy

## 2016-04-04 DIAGNOSIS — M25661 Stiffness of right knee, not elsewhere classified: Secondary | ICD-10-CM

## 2016-04-04 DIAGNOSIS — M25561 Pain in right knee: Secondary | ICD-10-CM | POA: Diagnosis not present

## 2016-04-04 DIAGNOSIS — M25562 Pain in left knee: Secondary | ICD-10-CM

## 2016-04-04 DIAGNOSIS — R2243 Localized swelling, mass and lump, lower limb, bilateral: Secondary | ICD-10-CM

## 2016-04-04 NOTE — Therapy (Signed)
[facility] [address] [place], [place], [address] Phone: [phone]   Fax:  [phone]  Physical Therapy Treatment  Patient Details  Name: Kristin Hubbard MRN: [id number] Date of Birth: April 17, 1960 Referring Provider: Dion Saucier  Encounter Date: 04/04/2016      PT End of Session - 04/04/16 0928    Visit Number 16   Date for PT Re-Evaluation 04/20/16   PT Start Time 0845   PT Stop Time 0942   PT Time Calculation (min) 57 min   Activity Tolerance Patient tolerated treatment well   Behavior During Therapy [facility] for tasks assessed/performed      Past Medical History:  Diagnosis Date  . Anxiety   . Bilateral primary osteoarthritis of knee 01/31/2016  . Complication of anesthesia    after 1 st surgery was combative,other surgeries have been fine  . Hypertension   . Hypothyroidism   . Migraine   . Thyroid disease    Hypothyroidism    Past Surgical History:  Procedure Laterality Date  . BUNIONECTOMY     hammer toe  . CHOLECYSTECTOMY    . KNEE SURGERY  05/2007   left  . TOTAL KNEE ARTHROPLASTY Bilateral 01/31/2016   Procedure: TOTAL KNEE BILATERAL;  Surgeon: Teryl Lucy, MD;  [facility];  Service: Orthopedics;  Laterality: Bilateral;    There were no vitals filed for this visit.      Subjective Assessment - 04/04/16 0844    Subjective "Its going" "I trained again yesterday"   Currently in Pain? No/denies   Pain Score 0-No pain                         [facility] Adult PT Treatment/Exercise - 04/04/16 0001      Knee/Hip Exercises: Aerobic   Elliptical 3 min I 5 R 5   Nustep level 6x33min     Knee/Hip Exercises: Machines for Strengthening   Cybex Knee Extension 25lb 2x10; SL 15lb x15 each   Cybex Knee Flexion 45lb 2x10; SL 20 B x15 each    Cybex Leg Press --     Knee/Hip Exercises: Standing   Walking with Sports Cord 30lb 4 way x5     Knee/Hip Exercises: Seated   Sit to Sand 2  sets;without UE support;15 reps  holding yellow ball      Cryotherapy   Number Minutes Cryotherapy 15 Minutes   Cryotherapy Location Knee   Type of Cryotherapy Ice pack     Vasopneumatic   Number Minutes Vasopneumatic  15 minutes   Vasopnuematic Location  Knee   Vasopneumatic Pressure Medium   Vasopneumatic Temperature  32                  PT Short Term Goals - 03/23/16 0930      PT SHORT TERM GOAL #1   Title independent with initial HEP   Time 2   Period Weeks   Status On-going           PT Long Term Goals - 03/13/16 1619      PT LONG TERM GOAL #1   Title decrease pain 50%   Status On-going     PT LONG TERM GOAL #2   Title increase AROM of the knees to 0-115 degrees flexion bilaterally   Status On-going     PT LONG TERM GOAL #3   Title walk 500 feet without stopping   Baseline edcuated on walking  program at home to build endurance   Status On-going     PT LONG TERM GOAL #4   Title ambulate without deviation with a SPC or less device   Status On-going               Plan - 04/04/16 [id number]    Clinical Impression Statement Pt with a progression to resisted gain, some instability controlling weight with side steps. Does reports some low back pain with prolong standing. Good strength with machine level interventions.   Rehab Potential Poor   PT Frequency 3x / week   PT Duration 8 weeks   PT Treatment/Interventions ADLs/Self Care Home Management;Cryotherapy;Moist Heat;Gait training;Functional mobility training;Patient/family education;Balance training;Therapeutic exercise;Therapeutic activities;Manual techniques;Vasopneumatic Device   PT Next Visit Plan fucn strength      Patient will benefit from skilled therapeutic intervention in order to improve the following deficits and impairments:  Abnormal gait, Decreased activity tolerance, Decreased balance, Decreased mobility, Decreased range of motion, Increased edema, Difficulty walking, Impaired  flexibility, Pain  Visit Diagnosis: Acute pain of right knee  Acute pain of left knee  Stiffness of right knee, not elsewhere classified  Localized swelling, mass and lump, lower limb, bilateral     Problem List Patient Active Problem List   Diagnosis Date Noted  . Bilateral primary osteoarthritis of knee 01/31/2016  . S/P total knee arthroplasty, bilateral 01/31/2016  . RBBB 12/30/2015  . Obesity (BMI 30-39.9) 01/23/2013  . Fever 01/19/2013  . Hyperlipidemia 10/31/2011  . POSTTRAUMATIC STRESS DISORDER 11/11/2009  . OTHER ACUTE REACTIONS TO STRESS 05/05/2009  . SCIATICA, RIGHT 01/21/2008  . GERD 07/22/2007  . FATIGUE 07/22/2007  . DEPRESSION 07/17/2007  . KNEE PAIN, RIGHT 05/09/2007  . ANXIETY 05/06/2007  . MIGRAINE HEADACHE 03/10/2007  . Morbid obesity (HCC) 08/23/2006  . CYST, SEBACEOUS 08/23/2006  . HYPOTHYROIDISM 03/12/2006  . Essential hypertension 03/12/2006    Grayce Sessionsonald G Pemberton, PTA 04/04/2016, 9:30 AM  [facility] [address] [place], [place], [address] Phone: [phone]   Fax:  [phone]  Name: Kristin AzureRobin Hubbard MRN: [id number] Date of Birth: 1960-06-12

## 2016-04-06 ENCOUNTER — Ambulatory Visit: Payer: PRIVATE HEALTH INSURANCE | Admitting: Physical Therapy

## 2016-04-09 ENCOUNTER — Encounter: Payer: Self-pay | Admitting: Physical Therapy

## 2016-04-09 ENCOUNTER — Ambulatory Visit: Payer: PRIVATE HEALTH INSURANCE | Admitting: Physical Therapy

## 2016-04-09 DIAGNOSIS — M25562 Pain in left knee: Secondary | ICD-10-CM

## 2016-04-09 DIAGNOSIS — M25561 Pain in right knee: Secondary | ICD-10-CM

## 2016-04-09 DIAGNOSIS — M25661 Stiffness of right knee, not elsewhere classified: Secondary | ICD-10-CM

## 2016-04-09 DIAGNOSIS — R2243 Localized swelling, mass and lump, lower limb, bilateral: Secondary | ICD-10-CM

## 2016-04-09 NOTE — Therapy (Signed)
[facility] [address] [place], [place], [address] Phone: [phone]   Fax:  [phone]  Physical Therapy Treatment  Patient Details  Name: Kristin AzureRobin Hubbard MRN: [id number] Date of Birth: Sep 20, 1960 Referring Provider: Dion SaucierLandau  Encounter Date: 04/09/2016      PT End of Session - 04/09/16 0844    Visit Number 17   Date for PT Re-Evaluation 04/20/16   PT Start Time 0800   PT Stop Time 0859   PT Time Calculation (min) 59 min   Activity Tolerance Patient tolerated treatment well   Behavior During Therapy [facility] for tasks assessed/performed      Past Medical History:  Diagnosis Date  . Anxiety   . Bilateral primary osteoarthritis of knee 01/31/2016  . Complication of anesthesia    after 1 st surgery was combative,other surgeries have been fine  . Hypertension   . Hypothyroidism   . Migraine   . Thyroid disease    Hypothyroidism    Past Surgical History:  Procedure Laterality Date  . BUNIONECTOMY     hammer toe  . CHOLECYSTECTOMY    . KNEE SURGERY  05/2007   left  . TOTAL KNEE ARTHROPLASTY Bilateral 01/31/2016   Procedure: TOTAL KNEE BILATERAL;  Surgeon: Teryl LucyJoshua Landau, MD;  [facility];  Service: Orthopedics;  Laterality: Bilateral;    There were no vitals filed for this visit.      Subjective Assessment - 04/09/16 0803    Subjective "Same old same old" Pt reports that she could not walk Friday due to muscle soreness. Pt reports that she was with her trainer 3 days last week.    Currently in Pain? Yes   Pain Score 4    Pain Location Knee   Pain Orientation Right;Left                         [facility] Adult PT Treatment/Exercise - 04/09/16 0001      Knee/Hip Exercises: Aerobic   Elliptical 3 min I 5 R 5   Nustep level 6x446min     Knee/Hip Exercises: Standing   Heel Raises Both;2 sets;15 reps;2 seconds   Lateral Step Up Both;1 set;10 reps;Hand Hold: 1;Step Height: 6"   Forward Step Up  Both;10 reps;Step Height: 6";1 set;Hand Hold: 1   Walking with Sports Cord 40lb 4 way x5  fatigue, reports some pain inupper R HS    Other Standing Knee Exercises Lateral step over on airex 2x5      Knee/Hip Exercises: Seated   Sit to Sand 2 sets;without UE support;15 reps  holding yellow ball      Cryotherapy   Number Minutes Cryotherapy 15 Minutes   Cryotherapy Location Knee   Type of Cryotherapy Ice pack     Vasopneumatic   Number Minutes Vasopneumatic  15 minutes   Vasopnuematic Location  Knee   Vasopneumatic Pressure Medium   Vasopneumatic Temperature  32                  PT Short Term Goals - 03/23/16 0930      PT SHORT TERM GOAL #1   Title independent with initial HEP   Time 2   Period Weeks   Status On-going           PT Long Term Goals - 03/13/16 1619      PT LONG TERM GOAL #1   Title decrease pain 50%   Status On-going  PT LONG TERM GOAL #2   Title increase AROM of the knees to 0-115 degrees flexion bilaterally   Status On-going     PT LONG TERM GOAL #3   Title walk 500 feet without stopping   Baseline edcuated on walking program at home to build endurance   Status On-going     PT LONG TERM GOAL #4   Title ambulate without deviation with a SPC or less device   Status On-going               Plan - 04/09/16 0848    Clinical Impression Statement Pt continues to be hesitant with functional interventions due to balance. PT reports upper HS pain and fatigue with sport cord walking. She reports that she had to do step up with her trainer that was really high.   Rehab Potential Poor   PT Frequency 3x / week   PT Duration 8 weeks   PT Treatment/Interventions ADLs/Self Care Home Management;Cryotherapy;Moist Heat;Gait training;Functional mobility training;Patient/family education;Balance training;Therapeutic exercise;Therapeutic activities;Manual techniques;Vasopneumatic Device   PT Next Visit Plan fucn strength      Patient will  benefit from skilled therapeutic intervention in order to improve the following deficits and impairments:  Abnormal gait, Decreased activity tolerance, Decreased balance, Decreased mobility, Decreased range of motion, Increased edema, Difficulty walking, Impaired flexibility, Pain  Visit Diagnosis: Stiffness of right knee, not elsewhere classified  Localized swelling, mass and lump, lower limb, bilateral  Acute pain of right knee  Acute pain of left knee     Problem List Patient Active Problem List   Diagnosis Date Noted  . Bilateral primary osteoarthritis of knee 01/31/2016  . S/P total knee arthroplasty, bilateral 01/31/2016  . RBBB 12/30/2015  . Obesity (BMI 30-39.9) 01/23/2013  . Fever 01/19/2013  . Hyperlipidemia 10/31/2011  . POSTTRAUMATIC STRESS DISORDER 11/11/2009  . OTHER ACUTE REACTIONS TO STRESS 05/05/2009  . SCIATICA, RIGHT 01/21/2008  . GERD 07/22/2007  . FATIGUE 07/22/2007  . DEPRESSION 07/17/2007  . KNEE PAIN, RIGHT 05/09/2007  . ANXIETY 05/06/2007  . MIGRAINE HEADACHE 03/10/2007  . Morbid obesity (HCC) 08/23/2006  . CYST, SEBACEOUS 08/23/2006  . HYPOTHYROIDISM 03/12/2006  . Essential hypertension 03/12/2006    Kristin Hubbard 04/09/2016, 8:49 AM  [facility] [address] [place], [place], [address] Phone: [phone]   Fax:  [phone]  Name: Kristin Hubbard MRN: [id number] Date of Birth: 01-May-1960

## 2016-04-11 ENCOUNTER — Encounter: Payer: Self-pay | Admitting: Physical Therapy

## 2016-04-11 ENCOUNTER — Ambulatory Visit: Payer: PRIVATE HEALTH INSURANCE | Admitting: Physical Therapy

## 2016-04-11 DIAGNOSIS — R2243 Localized swelling, mass and lump, lower limb, bilateral: Secondary | ICD-10-CM

## 2016-04-11 DIAGNOSIS — M25662 Stiffness of left knee, not elsewhere classified: Secondary | ICD-10-CM

## 2016-04-11 DIAGNOSIS — R262 Difficulty in walking, not elsewhere classified: Secondary | ICD-10-CM

## 2016-04-11 DIAGNOSIS — M25661 Stiffness of right knee, not elsewhere classified: Secondary | ICD-10-CM

## 2016-04-11 DIAGNOSIS — M25562 Pain in left knee: Secondary | ICD-10-CM

## 2016-04-11 DIAGNOSIS — M25561 Pain in right knee: Secondary | ICD-10-CM

## 2016-04-11 NOTE — Therapy (Signed)
[place], [place], [address] Phone: [phone]   Fax:  [phone]  Physical Therapy Treatment  Patient Details  Name: Kristin Hubbard MRN: [id number] Date of Birth: Jan 24, 1960 Referring Provider: Mardelle Matte  Encounter Date: 04/11/2016      PT End of Session - 04/11/16 5997    Visit Number 18   Date for PT Re-Evaluation 04/20/16   PT Start Time 1300   PT Stop Time 1359   PT Time Calculation (min) 59 min   Activity Tolerance Patient tolerated treatment well   Behavior During Therapy [facility] for tasks assessed/performed      Past Medical History:  Diagnosis Date  . Anxiety   . Bilateral primary osteoarthritis of knee 01/31/2016  . Complication of anesthesia    after 1 st surgery was combative,other surgeries have been fine  . Hypertension   . Hypothyroidism   . Migraine   . Thyroid disease    Hypothyroidism    Past Surgical History:  Procedure Laterality Date  . BUNIONECTOMY     hammer toe  . CHOLECYSTECTOMY    . KNEE SURGERY  05/2007   left  . TOTAL KNEE ARTHROPLASTY Bilateral 01/31/2016   Procedure: TOTAL KNEE BILATERAL;  Surgeon: Marchia Bond, MD;  Location: [place];  Service: Orthopedics;  Laterality: Bilateral;    There were no vitals filed for this visit.      Subjective Assessment - 04/11/16 1300    Subjective "Same old, I went to the doctor today and he was pleased"   Currently in Pain? Yes   Pain Score 4    Pain Location Knee   Pain Orientation Right                         [facility] Adult PT Treatment/Exercise - 04/11/16 0001      Ambulation/Gait   Stairs Yes   Stairs Assistance 5: Supervision;4: Min guard   Stairs Assistance Details (indicate cue type and reason) CGA assist needed to descend stairs with one rail   Stair Management Technique One rail Right;Two rails;Alternating pattern   Number of Stairs 24   Height of Stairs 6   Gait Comments max  encouragement needed to perform stair negotiation     High Level Balance   High Level Balance Activities Negotiating over obstacles   High Level Balance Comments side stepping over objects      Knee/Hip Exercises: Aerobic   Elliptical 3 min I 10 R 5   Nustep level 6x67mn     Knee/Hip Exercises: Standing   Walking with Sports Cord 40lb 4 way x5     Cryotherapy   Number Minutes Cryotherapy 15 Minutes   Cryotherapy Location Knee   Type of Cryotherapy Ice massage     Vasopneumatic   Number Minutes Vasopneumatic  15 minutes   Vasopnuematic Location  Knee   Vasopneumatic Pressure High   Vasopneumatic Temperature  32                  PT Short Term Goals - 03/23/16 0930      PT SHORT TERM GOAL #1   Title independent with initial HEP   Time 2   Period Weeks   Status On-going           PT Long Term Goals - 03/13/16 1619      PT LONG TERM GOAL #1   Title decrease pain 50%  Status On-going     PT LONG TERM GOAL #2   Title increase AROM of the knees to 0-115 degrees flexion bilaterally   Status On-going     PT LONG TERM GOAL #3   Title walk 500 feet without stopping   Baseline edcuated on walking program at home to build endurance   Status On-going     PT LONG TERM GOAL #4   Title ambulate without deviation with a SPC or less device   Status On-going               Plan - 04/11/16 1348    Clinical Impression Statement Pt very nervous ad hesitant to attempt stair negotiation requiring mac encouragement. Pt required min to CGA to descend stairs. Fatigues quickly with today's interventions.   Rehab Potential Good   PT Frequency 3x / week   PT Duration 8 weeks   PT Treatment/Interventions ADLs/Self Care Home Management;Cryotherapy;Moist Heat;Gait training;Functional mobility training;Patient/family education;Balance training;Therapeutic exercise;Therapeutic activities;Manual techniques;Vasopneumatic Device   PT Next Visit Plan fucn strength and  balance      Patient will benefit from skilled therapeutic intervention in order to improve the following deficits and impairments:  Abnormal gait, Decreased activity tolerance, Decreased balance, Decreased mobility, Decreased range of motion, Increased edema, Difficulty walking, Impaired flexibility, Pain  Visit Diagnosis: Stiffness of right knee, not elsewhere classified  Localized swelling, mass and lump, lower limb, bilateral  Acute pain of right knee  Acute pain of left knee  Stiffness of left knee, not elsewhere classified  Difficulty in walking, not elsewhere classified     Problem List Patient Active Problem List   Diagnosis Date Noted  . Bilateral primary osteoarthritis of knee 01/31/2016  . S/P total knee arthroplasty, bilateral 01/31/2016  . RBBB 12/30/2015  . Obesity (BMI 30-39.9) 01/23/2013  . Fever 01/19/2013  . Hyperlipidemia 10/31/2011  . POSTTRAUMATIC STRESS DISORDER 11/11/2009  . OTHER ACUTE REACTIONS TO STRESS 05/05/2009  . SCIATICA, RIGHT 01/21/2008  . GERD 07/22/2007  . FATIGUE 07/22/2007  . DEPRESSION 07/17/2007  . KNEE PAIN, RIGHT 05/09/2007  . ANXIETY 05/06/2007  . MIGRAINE HEADACHE 03/10/2007  . Morbid obesity ([place]) 08/23/2006  . CYST, SEBACEOUS 08/23/2006  . HYPOTHYROIDISM 03/12/2006  . Essential hypertension 03/12/2006    Scot Jun 04/11/2016, 1:52 PM  [place] [address] [place], [place], [address] Phone: [phone]   Fax:  [phone]  Name: Belkys Henault MRN: [id number] Date of Birth: [birth year]

## 2016-04-13 ENCOUNTER — Ambulatory Visit: Payer: PRIVATE HEALTH INSURANCE | Admitting: Physical Therapy

## 2016-04-13 ENCOUNTER — Encounter: Payer: Self-pay | Admitting: Physical Therapy

## 2016-04-13 DIAGNOSIS — M25562 Pain in left knee: Secondary | ICD-10-CM

## 2016-04-13 DIAGNOSIS — M25662 Stiffness of left knee, not elsewhere classified: Secondary | ICD-10-CM

## 2016-04-13 DIAGNOSIS — M25561 Pain in right knee: Secondary | ICD-10-CM | POA: Diagnosis not present

## 2016-04-13 DIAGNOSIS — R2243 Localized swelling, mass and lump, lower limb, bilateral: Secondary | ICD-10-CM

## 2016-04-13 DIAGNOSIS — M25661 Stiffness of right knee, not elsewhere classified: Secondary | ICD-10-CM

## 2016-04-13 DIAGNOSIS — R262 Difficulty in walking, not elsewhere classified: Secondary | ICD-10-CM

## 2016-04-13 NOTE — Therapy (Signed)
[place], [place], [address] Phone: [phone]   Fax:  [phone]  Physical Therapy Treatment  Patient Details  Name: Kristin Hubbard MRN: [id number] Date of Birth: 05/02/1960 Referring Provider: Mardelle Matte  Encounter Date: 04/13/2016      PT End of Session - 04/13/16 0905    Visit Number 19   Date for PT Re-Evaluation 04/20/16   PT Start Time 0845   PT Stop Time 0945   PT Time Calculation (min) 60 min      Past Medical History:  Diagnosis Date  . Anxiety   . Bilateral primary osteoarthritis of knee 01/31/2016  . Complication of anesthesia    after 1 st surgery was combative,other surgeries have been fine  . Hypertension   . Hypothyroidism   . Migraine   . Thyroid disease    Hypothyroidism    Past Surgical History:  Procedure Laterality Date  . BUNIONECTOMY     hammer toe  . CHOLECYSTECTOMY    . KNEE SURGERY  05/2007   left  . TOTAL KNEE ARTHROPLASTY Bilateral 01/31/2016   Procedure: TOTAL KNEE BILATERAL;  Surgeon: Marchia Bond, MD;  Location: [place];  Service: Orthopedics;  Laterality: Bilateral;    There were no vitals filed for this visit.      Subjective Assessment - 04/13/16 0846    Subjective nothing new   Pain Score 4    Pain Location Knee   Pain Orientation Right            [facility] PT Assessment - 04/13/16 0001      AROM   Right Knee Extension 0   Right Knee Flexion 112   Left Knee Extension 0   Left Knee Flexion 116     Strength   Overall Strength Comments 4+/5 bilaterally                     [facility] Adult PT Treatment/Exercise - 04/13/16 0001      Knee/Hip Exercises: Aerobic   Elliptical 3 fwd/3 back I 6  R 4   Nustep level 6x27mn     Knee/Hip Exercises: Machines for Strengthening   Cybex Knee Extension 25lb 2x10; SL 15lb x15 each   Cybex Knee Flexion 45lb 2x10; SL 20 B x15 each    Cybex Leg Press 100# 3 sets 10. SL 30# 10 each     Knee/Hip Exercises: Standing   Other Standing Knee Exercises 5# step fwd and sw 15 times each way each leg   Other Standing Knee Exercises alt step touch 8 inch 20 times 2 sets     Cryotherapy   Number Minutes Cryotherapy 15 Minutes   Cryotherapy Location Knee   Type of Cryotherapy Ice pack     Vasopneumatic   Number Minutes Vasopneumatic  15 minutes   Vasopnuematic Location  Knee   Vasopneumatic Temperature  32                  PT Short Term Goals - 04/13/16 0904      PT SHORT TERM GOAL #1   Title independent with initial HEP   Status Achieved           PT Long Term Goals - 04/13/16 0904      PT LONG TERM GOAL #1   Title decrease pain 50%   Status Achieved     PT LONG TERM GOAL #2   Title increase AROM of the  knees to 0-115 degrees flexion bilaterally   Baseline RT 0-112. Left 0-116   Status Partially Met     PT LONG TERM GOAL #3   Title walk 500 feet without stopping   Status Achieved     PT LONG TERM GOAL #4   Title ambulate without deviation with a SPC or less device   Status On-going               Plan - 04/13/16 0905    Clinical Impression Statement goals partially met. pt request HOLD as she is OOT next week, then RTW and will continue at gym with trainer.   PT Next Visit Plan HOLD 2 weeks. If no pt f/u D/C      Patient will benefit from skilled therapeutic intervention in order to improve the following deficits and impairments:  Abnormal gait, Decreased activity tolerance, Decreased balance, Decreased mobility, Decreased range of motion, Increased edema, Difficulty walking, Impaired flexibility, Pain  Visit Diagnosis: Stiffness of right knee, not elsewhere classified  Localized swelling, mass and lump, lower limb, bilateral  Acute pain of right knee  Acute pain of left knee  Stiffness of left knee, not elsewhere classified  Difficulty in walking, not elsewhere classified     Problem List Patient Active Problem List    Diagnosis Date Noted  . Bilateral primary osteoarthritis of knee 01/31/2016  . S/P total knee arthroplasty, bilateral 01/31/2016  . RBBB 12/30/2015  . Obesity (BMI 30-39.9) 01/23/2013  . Fever 01/19/2013  . Hyperlipidemia 10/31/2011  . POSTTRAUMATIC STRESS DISORDER 11/11/2009  . OTHER ACUTE REACTIONS TO STRESS 05/05/2009  . SCIATICA, RIGHT 01/21/2008  . GERD 07/22/2007  . FATIGUE 07/22/2007  . DEPRESSION 07/17/2007  . KNEE PAIN, RIGHT 05/09/2007  . ANXIETY 05/06/2007  . MIGRAINE HEADACHE 03/10/2007  . Morbid obesity ([place]) 08/23/2006  . CYST, SEBACEOUS 08/23/2006  . HYPOTHYROIDISM 03/12/2006  . Essential hypertension 03/12/2006    Raileigh Sabater,ANGIE PTA 04/13/2016, 9:20 AM  [place] [address] [place] [address] [place], [place], [address] Phone: [phone]   Fax:  [phone]  Name: Kristin Hubbard MRN: [id number] Date of Birth: [birth year]

## 2016-07-02 ENCOUNTER — Other Ambulatory Visit: Payer: Self-pay | Admitting: Family Medicine

## 2016-07-02 DIAGNOSIS — E785 Hyperlipidemia, unspecified: Secondary | ICD-10-CM

## 2016-07-02 DIAGNOSIS — I10 Essential (primary) hypertension: Secondary | ICD-10-CM

## 2016-07-02 NOTE — Telephone Encounter (Signed)
Pt due for follow up please call and schedule.  

## 2016-07-02 NOTE — Telephone Encounter (Signed)
lvm advising patient to call back and scheduled appointment

## 2016-08-06 ENCOUNTER — Other Ambulatory Visit: Payer: Self-pay | Admitting: Family Medicine

## 2016-08-06 DIAGNOSIS — E785 Hyperlipidemia, unspecified: Secondary | ICD-10-CM

## 2016-08-06 DIAGNOSIS — I10 Essential (primary) hypertension: Secondary | ICD-10-CM

## 2016-09-06 ENCOUNTER — Ambulatory Visit: Payer: PRIVATE HEALTH INSURANCE | Admitting: Family Medicine

## 2016-09-19 ENCOUNTER — Other Ambulatory Visit: Payer: Self-pay | Admitting: Family Medicine

## 2016-09-19 DIAGNOSIS — E785 Hyperlipidemia, unspecified: Secondary | ICD-10-CM

## 2016-09-19 DIAGNOSIS — I10 Essential (primary) hypertension: Secondary | ICD-10-CM

## 2016-10-05 ENCOUNTER — Encounter: Payer: Self-pay | Admitting: Family Medicine

## 2016-10-05 ENCOUNTER — Ambulatory Visit (INDEPENDENT_AMBULATORY_CARE_PROVIDER_SITE_OTHER): Payer: PRIVATE HEALTH INSURANCE | Admitting: Family Medicine

## 2016-10-05 VITALS — BP 118/76 | HR 97 | Temp 98.3°F | Ht 67.5 in | Wt 312.8 lb

## 2016-10-05 DIAGNOSIS — E785 Hyperlipidemia, unspecified: Secondary | ICD-10-CM | POA: Diagnosis not present

## 2016-10-05 DIAGNOSIS — I10 Essential (primary) hypertension: Secondary | ICD-10-CM

## 2016-10-05 DIAGNOSIS — E039 Hypothyroidism, unspecified: Secondary | ICD-10-CM

## 2016-10-05 LAB — COMPREHENSIVE METABOLIC PANEL
ALBUMIN: 4.2 g/dL (ref 3.5–5.2)
ALT: 17 U/L (ref 0–35)
AST: 16 U/L (ref 0–37)
Alkaline Phosphatase: 83 U/L (ref 39–117)
BILIRUBIN TOTAL: 0.4 mg/dL (ref 0.2–1.2)
BUN: 16 mg/dL (ref 6–23)
CALCIUM: 9.6 mg/dL (ref 8.4–10.5)
CO2: 24 meq/L (ref 19–32)
Chloride: 103 mEq/L (ref 96–112)
Creatinine, Ser: 0.83 mg/dL (ref 0.40–1.20)
GFR: 75.39 mL/min (ref >60.00)
Glucose, Bld: 115 mg/dL — ABNORMAL HIGH (ref 70–99)
Potassium: 3.8 mEq/L (ref 3.5–5.1)
Sodium: 137 mEq/L (ref 135–145)
Total Protein: 7.5 g/dL (ref 6.0–8.3)

## 2016-10-05 LAB — LIPID PANEL
CHOL/HDL RATIO: 4
CHOLESTEROL: 168 mg/dL (ref 0–200)
HDL: 47.8 mg/dL (ref >39.00)
LDL Cholesterol: 89 mg/dL (ref 0–99)
NonHDL: 120.02
TRIGLYCERIDES: 157 mg/dL — AB (ref 0.0–149.0)
VLDL: 31.4 mg/dL (ref 0.0–40.0)

## 2016-10-05 LAB — TSH: TSH: 2.11 u[IU]/mL (ref 0.35–4.50)

## 2016-10-05 NOTE — Patient Instructions (Signed)

## 2016-10-05 NOTE — Assessment & Plan Note (Signed)
Tolerating statin, encouraged heart healthy diet, avoid trans fats, minimize simple carbs and saturated fats. Increase exercise as tolerated 

## 2016-10-05 NOTE — Progress Notes (Signed)
Patient ID: Kristin Hubbard, female    DOB: [birth year]  Age: 56 y.o. MRN: [id number]    Subjective:  Subjective  HPI Kristin Hubbard presents for f/u bp and cholesterol.  No complaints.  She does not want a flu shot.    Review of Systems  Constitutional: Negative for appetite change, diaphoresis, fatigue and unexpected weight change.  Eyes: Negative for pain, redness and visual disturbance.  Respiratory: Negative for cough, chest tightness, shortness of breath and wheezing.   Cardiovascular: Negative for chest pain, palpitations and leg swelling.  Endocrine: Negative for cold intolerance, heat intolerance, polydipsia, polyphagia and polyuria.  Genitourinary: Negative for difficulty urinating, dysuria and frequency.  Neurological: Negative for dizziness, light-headedness, numbness and headaches.    History Past Medical History:  Diagnosis Date  . Anxiety   . Bilateral primary osteoarthritis of knee 01/31/2016  . Complication of anesthesia    after 1 st surgery was combative,other surgeries have been fine  . Hypertension   . Hypothyroidism   . Migraine   . Thyroid disease    Hypothyroidism    She has a past surgical history that includes Cholecystectomy; Knee surgery (05/2007); Bunionectomy; and Total knee arthroplasty (Bilateral, 01/31/2016).   Her family history includes Alcohol abuse in her father and mother; Diabetes in her father and unknown relative.She reports that she has been smoking Cigarettes.  She has a 17.00 pack-year smoking history. She has never used smokeless tobacco. She reports that she does not drink alcohol or use drugs.  Current Outpatient Prescriptions on File Prior to Visit  Medication Sig Dispense Refill  . amLODipine (NORVASC) 5 MG tablet TAKE ONE TABLET BY MOUTH ONCE DAILY 30 tablet 5  . hydrochlorothiazide (HYDRODIURIL) 25 MG tablet Take 1 tablet (25 mg total) by mouth daily. 1 po qd prn 30 tablet 11  . levothyroxine (SYNTHROID, LEVOTHROID) 150 MCG tablet TAKE  1 TABLET BY MOUTH ONCE DAILY 90 tablet 0  . simvastatin (ZOCOR) 40 MG tablet Take 1 tablet (40 mg total) by mouth at bedtime. 15 tablet 0   No current facility-administered medications on file prior to visit.      Objective:  Objective  Physical Exam  Constitutional: She is oriented to person, place, and time. She appears well-developed and well-nourished.  HENT:  Head: Normocephalic and atraumatic.  Eyes: Conjunctivae and EOM are normal.  Neck: Normal range of motion. Neck supple. No JVD present. Carotid bruit is not present. No thyromegaly present.  Cardiovascular: Normal rate, regular rhythm and normal heart sounds.   No murmur heard. Pulmonary/Chest: Effort normal and breath sounds normal. No respiratory distress. She has no wheezes. She has no rales. She exhibits no tenderness.  Musculoskeletal: She exhibits no edema.  Neurological: She is alert and oriented to person, place, and time.  Psychiatric: She has a normal mood and affect.  Nursing note and vitals reviewed.  BP 118/76 (BP Location: Right Arm, Patient Position: Sitting, Cuff Size: Normal)   Pulse 97   Temp 98.3 F (36.8 C) (Oral)   Ht 5' 7.5" (1.715 m)   Wt (!) 312 lb 12.8 oz (141.9 kg)   SpO2 95%   BMI 48.27 kg/m  Wt Readings from Last 3 Encounters:  10/05/16 (!) 312 lb 12.8 oz (141.9 kg)  01/31/16 (!) 313 lb (142 kg)  01/19/16 (!) 313 lb (142 kg)     Lab Results  Component Value Date   WBC 7.2 02/03/2016   HGB 9.3 (L) 02/03/2016   HCT 28.0 (L) 02/03/2016  PLT 145 (L) 02/03/2016   GLUCOSE 115 (H) 10/05/2016   CHOL 168 10/05/2016   TRIG 157.0 (H) 10/05/2016   HDL 47.80 10/05/2016   LDLDIRECT 173.6 08/22/2011   LDLCALC 89 10/05/2016   ALT 17 10/05/2016   AST 16 10/05/2016   NA 137 10/05/2016   K 3.8 10/05/2016   CL 103 10/05/2016   CREATININE 0.83 10/05/2016   BUN 16 10/05/2016   CO2 24 10/05/2016   TSH 2.11 10/05/2016   HGBA1C 5.6 01/20/2013    No results found.   Assessment & Plan:    Plan  I have discontinued Ms. Perdomo nystatin cream, oxyCODONE-acetaminophen, ondansetron, baclofen, sennosides-docusate sodium, rivaroxaban, HYDROmorphone, and diazepam. I am also having her maintain her hydrochlorothiazide, levothyroxine, amLODipine, and simvastatin.  No orders of the defined types were placed in this encounter.   Problem List Items Addressed This Visit      Unprioritized   Essential hypertension    Well controlled, no changes to meds. Encouraged heart healthy diet such as the DASH diet and exercise as tolerated.       Relevant Orders   TSH (Completed)   Lipid panel (Completed)   Comprehensive metabolic panel (Completed)   Hyperlipidemia    Tolerating statin, encouraged heart healthy diet, avoid trans fats, minimize simple carbs and saturated fats. Increase exercise as tolerated      Hypothyroidism - Primary    con't meds Check tsh      Relevant Orders   TSH (Completed)   Lipid panel (Completed)   Comprehensive metabolic panel (Completed)    Other Visit Diagnoses    Hyperlipidemia LDL goal <100       Relevant Orders   TSH (Completed)   Lipid panel (Completed)   Comprehensive metabolic panel (Completed)      Follow-up: Return in about 6 months (around 04/04/2017) for hypertension, hyperlipidemia, annual exam, fasting.  Donato Schultz, DO

## 2016-10-05 NOTE — Assessment & Plan Note (Signed)
con't meds Check tsh 

## 2016-10-05 NOTE — Assessment & Plan Note (Signed)
Well controlled, no changes to meds. Encouraged heart healthy diet such as the DASH diet and exercise as tolerated.  °

## 2016-10-08 ENCOUNTER — Encounter: Payer: Self-pay | Admitting: Podiatry

## 2016-10-08 ENCOUNTER — Other Ambulatory Visit: Payer: Self-pay | Admitting: Family Medicine

## 2016-10-08 ENCOUNTER — Ambulatory Visit (INDEPENDENT_AMBULATORY_CARE_PROVIDER_SITE_OTHER): Payer: PRIVATE HEALTH INSURANCE

## 2016-10-08 ENCOUNTER — Ambulatory Visit (INDEPENDENT_AMBULATORY_CARE_PROVIDER_SITE_OTHER): Payer: PRIVATE HEALTH INSURANCE | Admitting: Podiatry

## 2016-10-08 DIAGNOSIS — M2010 Hallux valgus (acquired), unspecified foot: Secondary | ICD-10-CM

## 2016-10-08 DIAGNOSIS — E785 Hyperlipidemia, unspecified: Secondary | ICD-10-CM

## 2016-10-08 DIAGNOSIS — E119 Type 2 diabetes mellitus without complications: Secondary | ICD-10-CM

## 2016-10-08 NOTE — Progress Notes (Signed)
Subjective:    Patient ID: Kristin Hubbard, female   DOB: 56 y.o.   MRN: [id number]   HPI patient states that she's ready to get her right foot fixed and her left one has done well and she had knee replacement earlier this year    ROS      Objective:  Physical Exam neurovascular status found to be intact muscle strength adequate range of motion within normal limits with patient found to have large hyperostosis medial aspect first metatarsal head right with redness and deviation of the right hallux against the second toe with irritation between the 2 toes. 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 Today I discussed correction of the right and she wants to get this fixed and understands we may not be able to get complete correction with distal osteotomy but she is willing to undergo this procedure. I allowed her to read consent form going over alternative treatments complications associated with distal osteotomy and possible Akin osteotomy and I explained procedure risk and there is no guarantee that this will give her complete success. Patient wants surgery signed consent form is given all instructions for the surgical procedures and is encouraged to call with any questions. Patient is dispensed air fracture walker with instructions on usage  X-rays indicate corrected left first metatarsal with slight movement which is healed fine with secondary bone intact. The right shows significant structural deformity with deviation the hallux against the second toe with mild elevation of the second digit

## 2016-10-08 NOTE — Patient Instructions (Addendum)
Bunion A bunion is a bump on the base of the big toe that forms when the bones of the big toe joint move out of position. Bunions may be small at first, but they often get larger over time. The can make walking painful. What are the causes? A bunion may be caused by:  Wearing narrow or pointed shoes that force the big toe to press against the other toes.  Abnormal foot development that causes the foot to roll inward (pronate).  Changes in the foot that are caused by certain diseases, such as rheumatoid arthritis and polio.  A foot injury.  What increases the risk? The following factors may make you more likely to develop this condition:  Wearing shoes that squeeze the toes together.  Having certain diseases, such as: ? Rheumatoid arthritis. ? Polio. ? Cerebral palsy.  Having family members who have bunions.  Being born with a foot deformity, such as flat feet or low arches.  Doing activities that put a lot of pressure on the feet, such as ballet dancing.  What are the signs or symptoms? The main symptom of a bunion is a noticeable bump on the big toe. Other symptoms may include:  Pain.  Swelling around the big toe.  Redness and inflammation.  Thick or hardened skin on the big toe or between the toes.  Stiffness or loss of motion in the big toe.  Trouble with walking.  How is this diagnosed? A bunion may be diagnosed based on your symptoms, medical history, and activities. You may have tests, such as:  X-rays. These allow your health care provider to check the position of the bones in your foot and look for damage to your joint. They also help your health care provider to determine the severity of your bunion and the best way to treat it.  Joint aspiration. In this test, a sample of fluid is removed from the toe joint. This test, which may be done if you are in a lot of pain, helps to rule out diseases that cause painful swelling of the joints, such as  arthritis.  How is this treated? There is no cure for a bunion, but treatment can help to prevent a bunion from getting worse. Treatment depends on the severity of your symptoms. Your health care provider may recommend:  Wearing shoes that have a wide toe box.  Using bunion pads to cushion the affected area.  Taping your toes together to keep them in a normal position.  Placing a device inside your shoe (orthotics) to help reduce pressure on your toe joint.  Taking medicine to ease pain, inflammation, and swelling.  Applying heat or ice to the affected area.  Doing stretching exercises.  Surgery to remove scar tissue and move the toes back into their normal position. This treatment is rare.  Follow these instructions at home:  Support your toe joint with proper footwear, shoe padding, or taping as told by your health care provider.  Take over-the-counter and prescription medicines only as told by your health care provider.  If directed, apply ice to the injured area: ? Put ice in a plastic bag. ? Place a towel between your skin and the bag. ? Leave the ice on for 20 minutes, 2-3 times per day.  If directed, apply heat to the affected area before you exercise. Use the heat source that your health care provider recommends, such as a moist heat pack or a heating pad. ? Place a towel between your   skin and the heat source. ? Leave the heat on for 20-30 minutes. ? Remove the heat if your skin turns bright red. This is especially important if you are unable to feel pain, heat, or cold. You may have a greater risk of getting burned.  Do exercises as told by your health care provider.  Keep all follow-up visits as told by your health care provider. Contact a health care provider if:  Your symptoms get worse.  Your symptoms do not improve in 2 weeks. Get help right away if:  You have severe pain and trouble with walking. This information is not intended to replace advice given  to you by your health care provider. Make sure you discuss any questions you have with your health care provider. Document Released: 01/08/2005 Document Revised: 06/16/2015 Document Reviewed: 08/08/2014 Elsevier Interactive Patient Education  2018 [employer]  Pre-Operative Instructions  Congratulations, you have decided to take an important step towards improving your quality of life.  You can be assured that the doctors and staff at [facility] will be with you every step of the way.  Here are some important things you should know:  1. Plan to be at the surgery center/hospital at least 1 (one) hour prior to your scheduled time, unless otherwise directed by the surgical center/hospital staff.  You must have a responsible adult accompany you, remain during the surgery and drive you home.  Make sure you have directions to the surgical center/hospital to ensure you arrive on time. 2. If you are having surgery at [facility] or [facility] hospitals, you will need a copy of your medical history and physical form from your family physician within one month prior to the date of surgery. We will give you a form for your primary physician to complete.  3. We make every effort to accommodate the date you request for surgery.  However, there are times where surgery dates or times have to be moved.  We will contact you as soon as possible if a change in schedule is required.   4. No aspirin/ibuprofen for one week before surgery.  If you are on aspirin, any non-steroidal anti-inflammatory medications (Mobic, Aleve, Ibuprofen) should not be taken seven (7) days prior to your surgery.  You make take Tylenol for pain prior to surgery.  5. Medications - If you are taking daily heart and blood pressure medications, seizure, reflux, allergy, asthma, anxiety, pain or diabetes medications, make sure you notify the surgery center/hospital before the day of surgery so they can tell you which medications you should  take or avoid the day of surgery. 6. No food or drink after midnight the night before surgery unless directed otherwise by surgical center/hospital staff. 7. No alcoholic beverages 24-hours prior to surgery.  No smoking 24-hours prior or 24-hours after surgery. 8. Wear loose pants or shorts. They should be loose enough to fit over bandages, boots, and casts. 9. Don't wear slip-on shoes. Sneakers are preferred. 10. Bring your boot with you to the surgery center/hospital.  Also bring crutches or a walker if your physician has prescribed it for you.  If you do not have this equipment, it will be provided for you after surgery. 11. If you have not been contacted by the surgery center/hospital by the day before your surgery, call to confirm the date and time of your surgery. 12. Leave-time from work may vary depending on the type of surgery you have.  Appropriate arrangements should be made prior   to surgery with your employer. 13. Prescriptions will be provided immediately following surgery by your doctor.  Fill these as soon as possible after surgery and take the medication as directed. Pain medications will not be refilled on weekends and must be approved by the doctor. 14. Remove nail polish on the operative foot and avoid getting pedicures prior to surgery. 15. Wash the night before surgery.  The night before surgery wash the foot and leg well with water and the antibacterial soap provided. Be sure to pay special attention to beneath the toenails and in between the toes.  Wash for at least three (3) minutes. Rinse thoroughly with water and dry well with a towel.  Perform this wash unless told not to do so by your physician.  Enclosed: 1 Ice pack (please put in freezer the night before surgery)   1 Hibiclens skin cleaner   Pre-op instructions  If you have any questions regarding the instructions, please do not hesitate to call our office.  [place]: [address], [place], [place] [address] --  [phone]  [place]: [address] [place], [place] [address] -- [phone]  [place]: [address]  [place], [place] [address] -- [phone]  [place]: [address], [address], [place], [place] [address] -- [phone]  Website: [email or web address]  

## 2016-10-18 ENCOUNTER — Other Ambulatory Visit: Payer: Self-pay | Admitting: Family Medicine

## 2016-10-18 DIAGNOSIS — E785 Hyperlipidemia, unspecified: Secondary | ICD-10-CM

## 2016-10-18 DIAGNOSIS — I10 Essential (primary) hypertension: Secondary | ICD-10-CM

## 2016-10-31 ENCOUNTER — Ambulatory Visit (INDEPENDENT_AMBULATORY_CARE_PROVIDER_SITE_OTHER): Payer: PRIVATE HEALTH INSURANCE | Admitting: Nurse Practitioner

## 2016-10-31 ENCOUNTER — Encounter: Payer: Self-pay | Admitting: Nurse Practitioner

## 2016-10-31 VITALS — BP 138/90 | HR 88 | Temp 98.1°F

## 2016-10-31 DIAGNOSIS — L720 Epidermal cyst: Secondary | ICD-10-CM

## 2016-10-31 NOTE — Progress Notes (Signed)
Subjective:    Patient ID: Kristin Hubbard, female    DOB: 13-Jul-1960, 56 y.o.   MRN: [id number]  HPI  Ms Eutsler is a 56 year old female who presents today with c/o skin lesion. She describes the lesion as a "cyst" under her right axilla. She first noticed the cyst 3 days ago. She describes the cyst as tender to touch but otherwise painless. She's had several of these in the past which she has been able to successfully drain at home. She has tried to drain this cyst several times with a sterile needle but is unable to express any drainage from it. She has not tried warm compresses.  She denies any fevers, nausea, and overall feels well.  Review of Systems  See HPI  Past Medical History:  Diagnosis Date  . Anxiety   . Bilateral primary osteoarthritis of knee 01/31/2016  . Complication of anesthesia    after 1 st surgery was combative,other surgeries have been fine  . Hypertension   . Hypothyroidism   . Migraine   . Thyroid disease    Hypothyroidism     Social History   Social History  . Marital status: Single    Spouse name: N/A  . Number of children: N/A  . Years of education: N/A   Occupational History  . RECEPTionist [employer]   Social History Main Topics  . Smoking status: Current Every Day Smoker    Packs/day: 1.00    Years: 17.00    Types: Cigarettes  . Smokeless tobacco: Never Used     Comment: she said when she loses 100 lbs she will quit smoking  . Alcohol use No  . Drug use: No  . Sexual activity: Not Currently    Partners: Male   Other Topics Concern  . Not on file   Social History Narrative   Exercise--  Trainer and gym    Past Surgical History:  Procedure Laterality Date  . BUNIONECTOMY     hammer toe  . CHOLECYSTECTOMY    . KNEE SURGERY  05/2007   left  . TOTAL KNEE ARTHROPLASTY Bilateral 01/31/2016   Procedure: TOTAL KNEE BILATERAL;  Surgeon: Teryl Lucy, MD;  Location: [facility] OR;  Service: Orthopedics;  Laterality: Bilateral;    Family  History  Problem Relation Age of Onset  . Diabetes Unknown   . Alcohol abuse Father   . Diabetes Father   . Alcohol abuse Mother     Allergies  Allergen Reactions  . Lisinopril Cough    Current Outpatient Prescriptions on File Prior to Visit  Medication Sig Dispense Refill  . amLODipine (NORVASC) 5 MG tablet TAKE ONE TABLET BY MOUTH ONCE DAILY 30 tablet 5  . hydrochlorothiazide (HYDRODIURIL) 25 MG tablet Take 1 tablet (25 mg total) by mouth daily. 1 po qd prn 30 tablet 11  . levothyroxine (SYNTHROID, LEVOTHROID) 150 MCG tablet TAKE 1 TABLET BY MOUTH ONCE DAILY 90 tablet 0  . simvastatin (ZOCOR) 40 MG tablet TAKE 1 TABLET BY MOUTH AT BEDTIME **OVERDUE FOR AN APPOINTMENT** 15 tablet 0   No current facility-administered medications on file prior to visit.     BP 138/90 (BP Location: Left Arm) Comment (Cuff Size): thigh  Pulse 88   Temp 98.1 F (36.7 C) (Oral)   SpO2 99%       Objective:   Physical Exam  Constitutional: She is oriented to person, place, and time. No distress.  Obese.  Cardiovascular: Normal rate, regular rhythm and normal  heart sounds.   Pulmonary/Chest: Effort normal and breath sounds normal.  Neurological: She is alert and oriented to person, place, and time.  Skin: Skin is warm and dry.  Pea-sized subcutaneous nodule, right axilla. Firm, moveable, no fluctuance.        Assessment & Plan:  Cyst of skin and subcutaneous tissue- Patient requesting surgical drainage of cyst. I did not believe this was medically indicated due to the nature of the cyst. Sandford Craze, NP to exam room with me for another opinion and she agreed. We explained to the patient that I&D not medically indicated due to firm, non-fluctuant nature of nodule. We recommended warm compresses and offered oral antibiotics, which the patient angrily declined. She was very irritated and walked out of the exam room without further discussion or discharge instructions.

## 2016-11-21 ENCOUNTER — Other Ambulatory Visit: Payer: Self-pay | Admitting: Family Medicine

## 2016-11-21 DIAGNOSIS — I10 Essential (primary) hypertension: Secondary | ICD-10-CM

## 2016-11-21 DIAGNOSIS — E785 Hyperlipidemia, unspecified: Secondary | ICD-10-CM

## 2016-11-21 MED ORDER — SIMVASTATIN 40 MG PO TABS: 40.0000 mg | ORAL_TABLET | Freq: Every day | ORAL | 0 refills | Status: DC

## 2016-12-11 ENCOUNTER — Telehealth: Payer: Self-pay | Admitting: *Deleted

## 2016-12-11 ENCOUNTER — Encounter: Payer: Self-pay | Admitting: Podiatry

## 2016-12-11 DIAGNOSIS — M2011 Hallux valgus (acquired), right foot: Secondary | ICD-10-CM | POA: Diagnosis not present

## 2016-12-11 NOTE — Telephone Encounter (Signed)
Pt states she having difficulty swallowing, her mouth and throat are very dry and scratchy, having to drink lots of water to get food down. Pt asked if she was intubated, I told her I do not have her surgery notes. I told her it could be a reaction to the anesthesia or the percocet. I asked if her throat felt swollen and she described it as dry and difficult to swallow food. I told her to take small bites and to drink lots of fluids and eat soups and ice cream and jello to decrease the effects, and to go to the ED if she had swelling in the throat, mouth or face. Pt states understanding.

## 2016-12-17 NOTE — Progress Notes (Signed)
DOS 12/11/2016 [place] Bunionectomy RT; Possible Aiken Osteotomy RT

## 2016-12-18 ENCOUNTER — Telehealth: Payer: Self-pay | Admitting: Podiatry

## 2016-12-18 NOTE — Telephone Encounter (Signed)
[facility] - Kristin Hubbard states the Percocet 10/325 rx exceeds the recommended DEA 50mEq of Morephine per day and would like to change to Percocet 10/325mg  #21 one tablet every 8 hours prn foot pain. Dr. Everlena Cooperegal okayed the change and I informed Kristin Hubbard.

## 2016-12-18 NOTE — Telephone Encounter (Signed)
[facility] for Percocet 10-325. Please call us back at [phone]. Thank you. Bye bye.

## 2016-12-19 ENCOUNTER — Ambulatory Visit (INDEPENDENT_AMBULATORY_CARE_PROVIDER_SITE_OTHER): Payer: PRIVATE HEALTH INSURANCE | Admitting: Podiatry

## 2016-12-19 ENCOUNTER — Encounter: Payer: Self-pay | Admitting: Podiatry

## 2016-12-19 ENCOUNTER — Ambulatory Visit (INDEPENDENT_AMBULATORY_CARE_PROVIDER_SITE_OTHER): Payer: PRIVATE HEALTH INSURANCE

## 2016-12-19 DIAGNOSIS — M2010 Hallux valgus (acquired), unspecified foot: Secondary | ICD-10-CM | POA: Diagnosis not present

## 2016-12-19 NOTE — Progress Notes (Signed)
Subjective:    Patient ID: Kristin Hubbard, female   DOB: 56 y.o.   MRN: [id number]   HPI patient presents that she's doing well but she states that she admits she's been walking on her foot without immobilization    ROS      Objective:  Physical Exam neurovascular status intact negative Homans sign noted with patient's right foot healing well with wound edges well coapted and good alignment with good range of motion     Assessment:    Patient has been noncompliant but appears to be healing well     Plan:   H&P x-rays reviewed and I discussed the importance of immobilization compression and elevation. Patient promises me she will do this and at this time sterile dressing reapplied and patient be seen back 3 weeks or earlier if needed  X-rays indicated osteotomy is healing well with indications currently of movement but there is some stress on it secondary to noncompliance

## 2017-01-01 ENCOUNTER — Ambulatory Visit: Payer: Self-pay

## 2017-01-01 ENCOUNTER — Encounter: Payer: Self-pay | Admitting: Family Medicine

## 2017-01-01 ENCOUNTER — Telehealth: Payer: Self-pay | Admitting: Family Medicine

## 2017-01-01 NOTE — Telephone Encounter (Signed)
Pt calling asking if an antibiotic could be called in for treatment of a tooth infection. Pt voicing concern of infection spreading with her having recent knee replacements. Pt states she is in the process of finding a dentist and is not available to come in for an appt until next week. Pt advised to use evisits through Mychart or to go to Urgent Care. Pt states she does not want to go to Urgent Care at his time so she will try to do an evist through Mychart.Pt verbalized understanding.

## 2017-01-01 NOTE — Telephone Encounter (Signed)
Pt called to say that the e-visit did not work. Pt states that she attempted it but there is no area for tooth issues. [facility] stating she cannot get off work for that. Offered pt video/phone visit at [email or web address]. [facility] Pt does not have a dentist. Pt concerned that infection could go into her knees (Pt 1 yr ago s/p knee replacements). Pt was wanting a RX for antibiotics called in.   Reason for Disposition . Face is very swollen  Answer Assessment - Initial Assessment Questions 1. LOCATION: "Which tooth is hurting?"  (e.g., right-side/left-side, upper/lower, front/back)     Right side upper 2-3 rd tooth from the front 2. ONSET: "When did the toothache start?"  (e.g., hours, days)      Sunday 3. SEVERITY: "How bad is the toothache?"  (Scale 1-10; mild, moderate or severe)   - MILD (1-3): doesn't interfere with chewing    - MODERATE (4-7): interferes with chewing, interferes with normal activities, awakens from sleep     - SEVERE (8-10): unable to eat, unable to do any normal activities, excruciating pain        7/10 4. SWELLING: "Is there any visible swelling of your face?"     Yes right cheek area 5. OTHER SYMPTOMS: "Do you have any other symptoms?" (e.g., fever)     No  6. PREGNANCY: "Is there any chance you are pregnant?" "When was your last menstrual period?"     n/a  Protocols used: TOOTHACHE-A-AH

## 2017-01-02 ENCOUNTER — Encounter: Payer: Self-pay | Admitting: Family Medicine

## 2017-01-02 NOTE — Telephone Encounter (Signed)
augmentin 875 mg bid x 10 days  Ok to send diflucan 150 mg #2 1 po qd x1 may repeat in 3 days prn

## 2017-01-03 MED ORDER — FLUCONAZOLE 150 MG PO TABS: 150.0000 mg | ORAL_TABLET | Freq: Once | ORAL | 0 refills | Status: DC | PRN

## 2017-01-03 MED ORDER — AMOXICILLIN-POT CLAVULANATE 875-125 MG PO TABS: 1.0000 | ORAL_TABLET | Freq: Two times a day (BID) | ORAL | 0 refills | Status: DC

## 2017-01-03 NOTE — Telephone Encounter (Signed)
augmentin 875 bid x 10 days  

## 2017-01-10 ENCOUNTER — Ambulatory Visit (INDEPENDENT_AMBULATORY_CARE_PROVIDER_SITE_OTHER): Payer: PRIVATE HEALTH INSURANCE

## 2017-01-10 ENCOUNTER — Encounter: Payer: Self-pay | Admitting: Podiatry

## 2017-01-10 ENCOUNTER — Ambulatory Visit (INDEPENDENT_AMBULATORY_CARE_PROVIDER_SITE_OTHER): Payer: PRIVATE HEALTH INSURANCE | Admitting: Podiatry

## 2017-01-10 DIAGNOSIS — M2011 Hallux valgus (acquired), right foot: Secondary | ICD-10-CM

## 2017-01-10 NOTE — Progress Notes (Signed)
Subjective:   Patient ID: Kristin Hubbard, female   DOB: 56 y.o.   MRN: [id number]   HPI Patient presents stating she is doing pretty well but she still gets some swelling if she is on her foot too much   ROS      Objective:  Physical Exam  Neurovascular status intact with patient's right foot healing well with wound edges well coapted good range of motion with no crepitus of the joint noted     Assessment:  Doing well after having surgery right but patient was quite active on it and did walk on it and did develop some compromise of the joint     Plan:  Reviewed condition and x-ray at this point continue complete immobilization range of motion exercises and I do think it will heal uneventfully disposition but I did explain there is been some trauma to the joint surface new  The osteotomy has lifted some with fixation in place and I do think it will heal but I need to keep an eye on it over the next 4 weeks and we will continue to keep it immobilized

## 2017-01-14 ENCOUNTER — Other Ambulatory Visit: Payer: Self-pay | Admitting: Family Medicine

## 2017-02-11 ENCOUNTER — Ambulatory Visit (INDEPENDENT_AMBULATORY_CARE_PROVIDER_SITE_OTHER): Payer: PRIVATE HEALTH INSURANCE

## 2017-02-11 ENCOUNTER — Ambulatory Visit (INDEPENDENT_AMBULATORY_CARE_PROVIDER_SITE_OTHER): Payer: PRIVATE HEALTH INSURANCE | Admitting: Podiatry

## 2017-02-11 ENCOUNTER — Encounter: Payer: Self-pay | Admitting: Podiatry

## 2017-02-11 DIAGNOSIS — M2011 Hallux valgus (acquired), right foot: Secondary | ICD-10-CM | POA: Diagnosis not present

## 2017-02-11 DIAGNOSIS — M2010 Hallux valgus (acquired), unspecified foot: Secondary | ICD-10-CM

## 2017-02-13 NOTE — Progress Notes (Signed)
Subjective:   Patient ID: Kristin Hubbard, female   DOB: 57 y.o.   MRN: [id number]   HPI Patient states she is not in any pain but she was concerned because her big toe and her second toe are touching   ROS      Objective:  Physical Exam  Neurovascular status intact with patient's hallux and second toe slightly touching but no keratotic lesion in between with good range of motion of the first MPJ     Assessment:  Doing well post surgery right the patient is very active and has had some movement of the bone secondary to noncompliance     Plan:  Reviewed that everything looks good clinically and that I do want her to still be careful with it for another 4-6 weeks and that I do not think there is any issue between the big toe and second toe but I did apply padding to the big toe to cushion it and patient is discharged and will be seen back if any issues should occur  X-ray indicates that there is osteotomy that is undergone mild movement but fixation is in place and it appears to be stable at this position

## 2017-03-11 ENCOUNTER — Encounter: Payer: Self-pay | Admitting: Family Medicine

## 2017-03-11 NOTE — Telephone Encounter (Signed)
I think she  Means qsymia--- it has phenteramine in it which is not a good thing with bp meds etc  She can call Dr Dalbert GarnetBeasley about the healthy weight and wellness program -- we can give her that info

## 2017-03-11 NOTE — Telephone Encounter (Signed)
I really do not like qsymia because of the phenteramine---  I had another pt younger than her have an MI on it

## 2017-03-20 ENCOUNTER — Other Ambulatory Visit: Payer: Self-pay | Admitting: Family Medicine

## 2017-03-20 DIAGNOSIS — I10 Essential (primary) hypertension: Secondary | ICD-10-CM

## 2017-03-20 DIAGNOSIS — E785 Hyperlipidemia, unspecified: Secondary | ICD-10-CM

## 2017-04-29 ENCOUNTER — Ambulatory Visit: Payer: PRIVATE HEALTH INSURANCE | Admitting: Family Medicine

## 2017-04-29 ENCOUNTER — Encounter: Payer: Self-pay | Admitting: Family Medicine

## 2017-04-29 VITALS — BP 138/86 | HR 113 | Temp 98.4°F | Resp 16 | Ht 68.0 in | Wt 318.6 lb

## 2017-04-29 DIAGNOSIS — R739 Hyperglycemia, unspecified: Secondary | ICD-10-CM | POA: Diagnosis not present

## 2017-04-29 DIAGNOSIS — E785 Hyperlipidemia, unspecified: Secondary | ICD-10-CM | POA: Diagnosis not present

## 2017-04-29 DIAGNOSIS — E039 Hypothyroidism, unspecified: Secondary | ICD-10-CM

## 2017-04-29 DIAGNOSIS — I10 Essential (primary) hypertension: Secondary | ICD-10-CM

## 2017-04-29 DIAGNOSIS — E669 Obesity, unspecified: Secondary | ICD-10-CM | POA: Diagnosis not present

## 2017-04-29 NOTE — Patient Instructions (Signed)

## 2017-04-29 NOTE — Assessment & Plan Note (Signed)
D/w pt trying saxenda again-- she will check with her ins

## 2017-04-29 NOTE — Assessment & Plan Note (Signed)
D/w healthy weight and wellness but pt was not interested

## 2017-04-29 NOTE — Assessment & Plan Note (Signed)
Well controlled, no changes to meds. Encouraged heart healthy diet such as the DASH diet and exercise as tolerated.  °

## 2017-04-29 NOTE — Assessment & Plan Note (Signed)
Lab Results  Component Value Date   TSH 2.11 10/05/2016

## 2017-04-29 NOTE — Progress Notes (Signed)
Patient ID: Kristin Hubbard, female    DOB: September 13, 1960  Age: 57 y.o. MRN: [id number]    Subjective:  Subjective  HPI Kristin Hubbard presents for foster care form to fill out.  No complaints.  Review of Systems  Constitutional: Negative for appetite change, diaphoresis, fatigue and unexpected weight change.  Eyes: Negative for pain, redness and visual disturbance.  Respiratory: Negative for cough, chest tightness, shortness of breath and wheezing.   Cardiovascular: Negative for chest pain, palpitations and leg swelling.  Endocrine: Negative for cold intolerance, heat intolerance, polydipsia, polyphagia and polyuria.  Genitourinary: Negative for difficulty urinating, dysuria and frequency.  Neurological: Negative for dizziness, light-headedness, numbness and headaches.  Psychiatric/Behavioral: Negative.     History Past Medical History:  Diagnosis Date  . Anxiety   . Bilateral primary osteoarthritis of knee 01/31/2016  . Complication of anesthesia    after 1 st surgery was combative,other surgeries have been fine  . Hypertension   . Hypothyroidism   . Migraine   . Thyroid disease    Hypothyroidism    She has a past surgical history that includes Cholecystectomy; Knee surgery (05/2007); Bunionectomy; and Total knee arthroplasty (Bilateral, 01/31/2016).   Her family history includes Alcohol abuse in her father and mother; Diabetes in her father and unknown relative.She reports that she has been smoking cigarettes.  She has a 17.00 pack-year smoking history. She has never used smokeless tobacco. She reports that she does not drink alcohol or use drugs.  Current Outpatient Medications on File Prior to Visit  Medication Sig Dispense Refill  . amLODipine (NORVASC) 5 MG tablet TAKE 1 TABLET BY MOUTH ONCE DAILY 90 tablet 0  . levothyroxine (SYNTHROID, LEVOTHROID) 150 MCG tablet Take 1 tablet (150 mcg total) by mouth daily before breakfast. 90 tablet 0  . simvastatin (ZOCOR) 40 MG tablet TAKE 1  TABLET BY MOUTH AT BEDTIME 90 tablet 0   No current facility-administered medications on file prior to visit.      Objective:  Objective  Physical Exam  Constitutional: She is oriented to person, place, and time. She appears well-nourished.  HENT:  Nose: Mucosal edema, rhinorrhea and sinus tenderness present. No nasal deformity. Right sinus exhibits maxillary sinus tenderness and frontal sinus tenderness. Left sinus exhibits maxillary sinus tenderness and frontal sinus tenderness.  Mouth/Throat: Oropharynx is clear and moist and mucous membranes are normal. No oropharyngeal exudate.  Neck: Normal range of motion. Neck supple.  Cardiovascular: Normal rate, regular rhythm and normal heart sounds.  No murmur heard. Pulmonary/Chest: Effort normal and breath sounds normal. No respiratory distress. She has no rales.  Lymphadenopathy:    She has no cervical adenopathy.  Neurological: She is alert and oriented to person, place, and time.  Skin: Skin is warm. She is not diaphoretic.  Psychiatric: She has a normal mood and affect. Her behavior is normal. Judgment and thought content normal.  Nursing note and vitals reviewed.  BP 138/86 (BP Location: Right Arm, Cuff Size: Large)   Pulse (!) 113   Temp 98.4 F (36.9 C) (Oral)   Resp 16   Ht 5\' 8"  (1.727 m)   Wt (!) 318 lb 9.6 oz (144.5 kg)   SpO2 97%   BMI 48.44 kg/m  Wt Readings from Last 3 Encounters:  04/29/17 (!) 318 lb 9.6 oz (144.5 kg)  10/05/16 (!) 312 lb 12.8 oz (141.9 kg)  01/31/16 (!) 313 lb (142 kg)     Lab Results  Component Value Date   WBC 7.2 02/03/2016  HGB 9.3 (L) 02/03/2016   HCT 28.0 (L) 02/03/2016   PLT 145 (L) 02/03/2016   GLUCOSE 115 (H) 10/05/2016   CHOL 168 10/05/2016   TRIG 157.0 (H) 10/05/2016   HDL 47.80 10/05/2016   LDLDIRECT 173.6 08/22/2011   LDLCALC 89 10/05/2016   ALT 17 10/05/2016   AST 16 10/05/2016   NA 137 10/05/2016   K 3.8 10/05/2016   CL 103 10/05/2016   CREATININE 0.83 10/05/2016     BUN 16 10/05/2016   CO2 24 10/05/2016   TSH 2.11 10/05/2016   HGBA1C 5.6 01/20/2013    No results found.   Assessment & Plan:  Plan  I have discontinued Rosaland Pontarelli's hydrochlorothiazide, oxyCODONE-acetaminophen, ondansetron, amoxicillin-clavulanate, and fluconazole. I am also having her maintain her levothyroxine, amLODipine, and simvastatin.  No orders of the defined types were placed in this encounter.   Problem List Items Addressed This Visit      Unprioritized   Essential hypertension    Well controlled, no changes to meds. Encouraged heart healthy diet such as the DASH diet and exercise as tolerated.       Hyperlipidemia    Tolerating statin, encouraged heart healthy diet, avoid trans fats, minimize simple carbs and saturated fats. Increase exercise as tolerated      Hypothyroidism    Lab Results  Component Value Date   TSH 2.11 10/05/2016         Relevant Orders   TSH   Morbid obesity (HCC)    D/w healthy weight and wellness but pt was not interested      Obesity (BMI 30-39.9)    D/w pt trying saxenda again-- she will check with her ins       Other Visit Diagnoses    Hyperlipidemia LDL goal <100    -  Primary   Relevant Orders   Comprehensive metabolic panel   Lipid panel   Hyperglycemia       Relevant Orders   Hemoglobin A1c      Follow-up: Return in about 6 months (around 10/29/2017), or if symptoms worsen or fail to improve, for annual exam, fasting.  Donato SchultzYvonne R Lowne Chase, DO

## 2017-04-29 NOTE — Assessment & Plan Note (Signed)
Tolerating statin, encouraged heart healthy diet, avoid trans fats, minimize simple carbs and saturated fats. Increase exercise as tolerated 

## 2017-04-30 ENCOUNTER — Other Ambulatory Visit: Payer: Self-pay | Admitting: Family Medicine

## 2017-04-30 NOTE — Telephone Encounter (Signed)
Its an injectable --- she needs nurse visit so we can show her how to use it 0.6 mg daily for 1 week  Then 1.2 mg daily for 1 week Then 1.8 mg daily for 1 week Then inc by 0.6 every week until reach 3.0 mg   We can then send it in to the pharmacy

## 2017-04-30 NOTE — Telephone Encounter (Signed)
I nurse can give it ---  If she came her we could start her with a sample  Up to her

## 2017-05-01 ENCOUNTER — Other Ambulatory Visit: Payer: Self-pay | Admitting: Family Medicine

## 2017-05-02 ENCOUNTER — Encounter: Payer: Self-pay | Admitting: Family Medicine

## 2017-05-02 ENCOUNTER — Other Ambulatory Visit: Payer: Self-pay | Admitting: *Deleted

## 2017-05-02 MED ORDER — INSULIN PEN NEEDLE 32G X 6 MM MISC: 1 refills | Status: DC

## 2017-05-02 MED ORDER — LIRAGLUTIDE -WEIGHT MANAGEMENT 18 MG/3ML ~~LOC~~ SOPN: 3.0000 mg | PEN_INJECTOR | Freq: Every day | SUBCUTANEOUS | 1 refills | Status: DC

## 2017-05-02 NOTE — Telephone Encounter (Signed)
rx sent in for saxenda

## 2017-05-02 NOTE — Telephone Encounter (Signed)
Pt came to pick the sample and pt will need rx sent to [facility] [address]

## 2017-05-03 ENCOUNTER — Other Ambulatory Visit: Payer: Self-pay | Admitting: *Deleted

## 2017-05-03 MED ORDER — INSULIN PEN NEEDLE 32G X 4 MM MISC: 1 refills | Status: DC

## 2017-05-16 ENCOUNTER — Encounter: Payer: Self-pay | Admitting: Family Medicine

## 2017-05-16 NOTE — Telephone Encounter (Signed)
What dose is she on--  Sometimes pt can back down and inc slower----ie dont go up by 0.6 ---  Go inbetween

## 2017-05-21 NOTE — Telephone Encounter (Signed)
Patient is at 1.8 now.  So just advise to just go in between?

## 2017-06-25 ENCOUNTER — Encounter: Payer: Self-pay | Admitting: Family Medicine

## 2017-06-25 DIAGNOSIS — E785 Hyperlipidemia, unspecified: Secondary | ICD-10-CM

## 2017-06-25 DIAGNOSIS — I10 Essential (primary) hypertension: Secondary | ICD-10-CM

## 2017-06-25 MED ORDER — SIMVASTATIN 40 MG PO TABS: 40.0000 mg | ORAL_TABLET | Freq: Every day | ORAL | 1 refills | Status: DC

## 2017-06-25 MED ORDER — AMLODIPINE BESYLATE 5 MG PO TABS: 5.0000 mg | ORAL_TABLET | Freq: Every day | ORAL | 1 refills | Status: DC

## 2017-07-13 IMAGING — CR DG KNEE 1-2V PORT*R*
1 series · 1 of 1 positions shown · non-contrast
Comparison: None.

CLINICAL DATA: 55 y/o  F; bilateral total knee arthroplasty.

EXAM:
PORTABLE RIGHT KNEE - 1-2 VIEW

[AP]
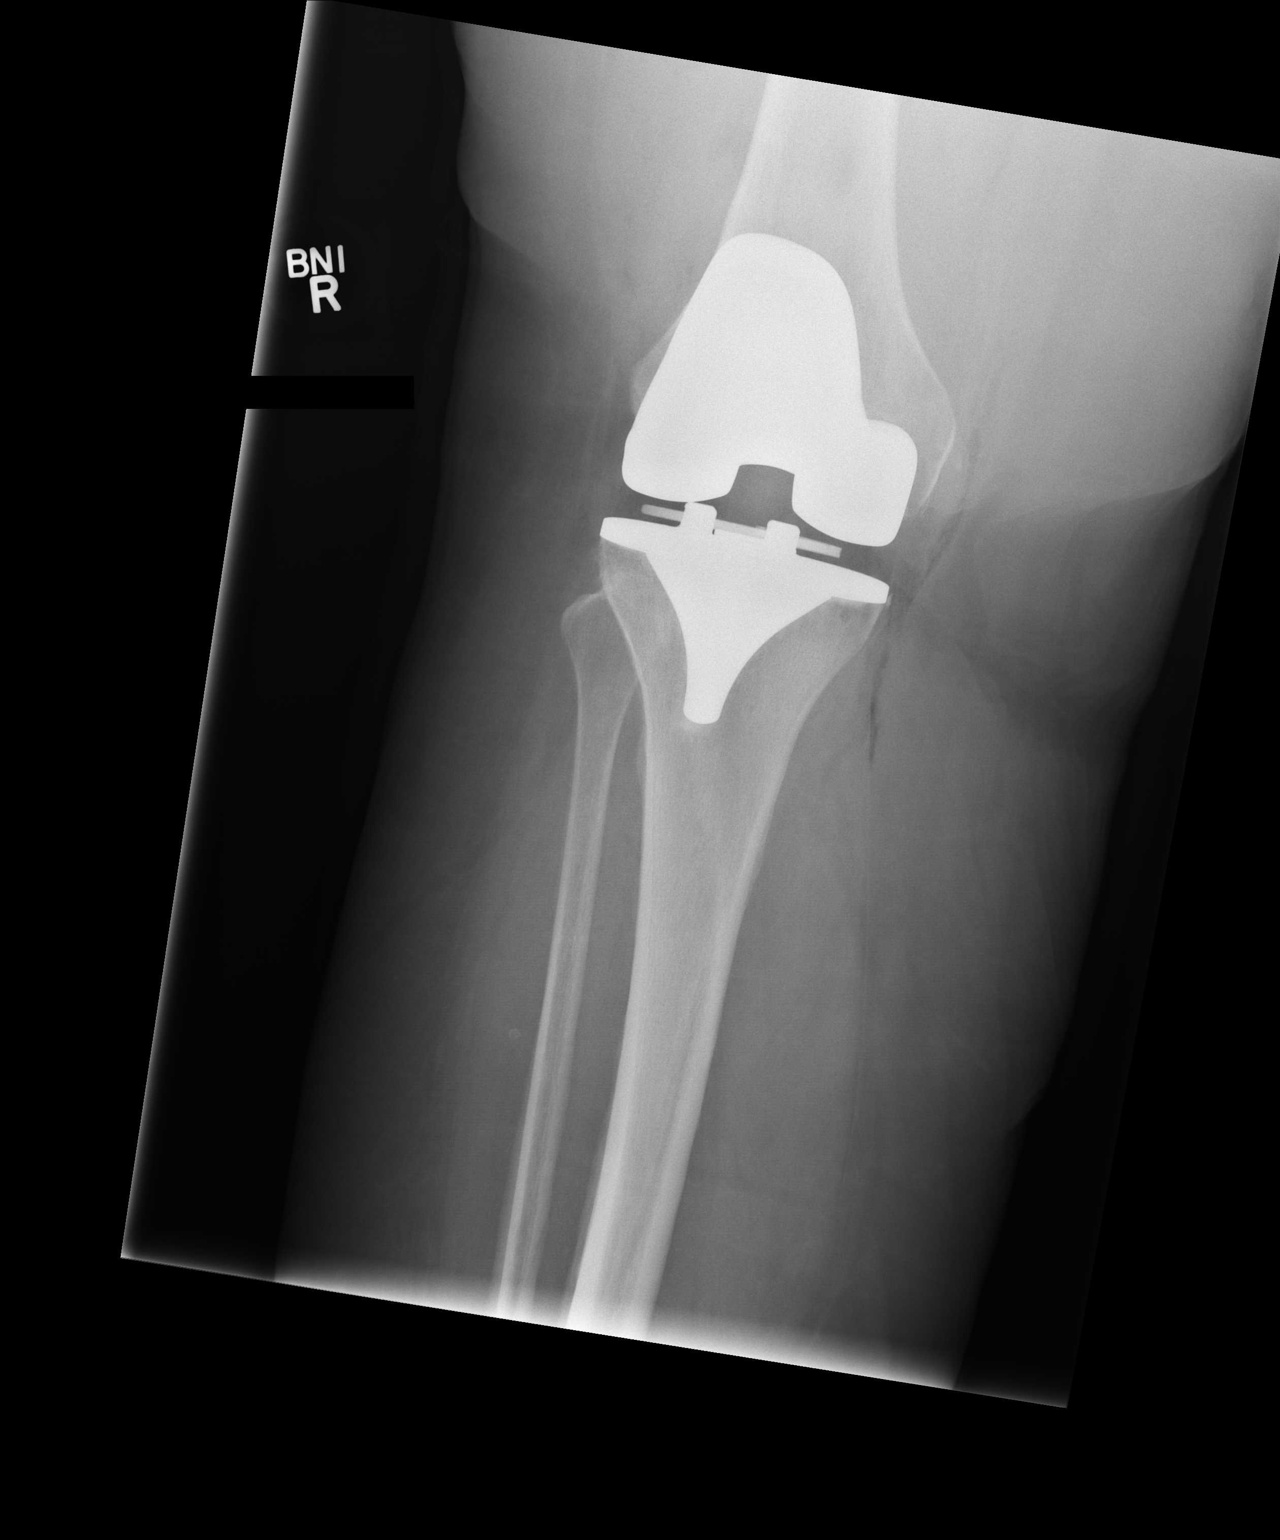

[1 of 1 positions shown; findings below may reference images not displayed]

FINDINGS: Right-sided total knee arthroplasty and patellar resurfacing with
prosthesis. No acute fracture is identified. No apparent hardware
related complication. Expected postsurgical changes with small joint
effusion and air and edema/air in the soft tissues.
IMPRESSION: Right-sided total knee arthroplasty with expected postsurgical
changes.

By: Tripti Tiger M.D.

## 2017-07-13 IMAGING — CR DG KNEE 1-2V PORT*L*
2 series · 2 of 2 positions shown · non-contrast
Comparison: None.

CLINICAL DATA: 55 y/o  F; total knee arthroplasty.

EXAM:
PORTABLE LEFT KNEE - 1-2 VIEW

[AP]
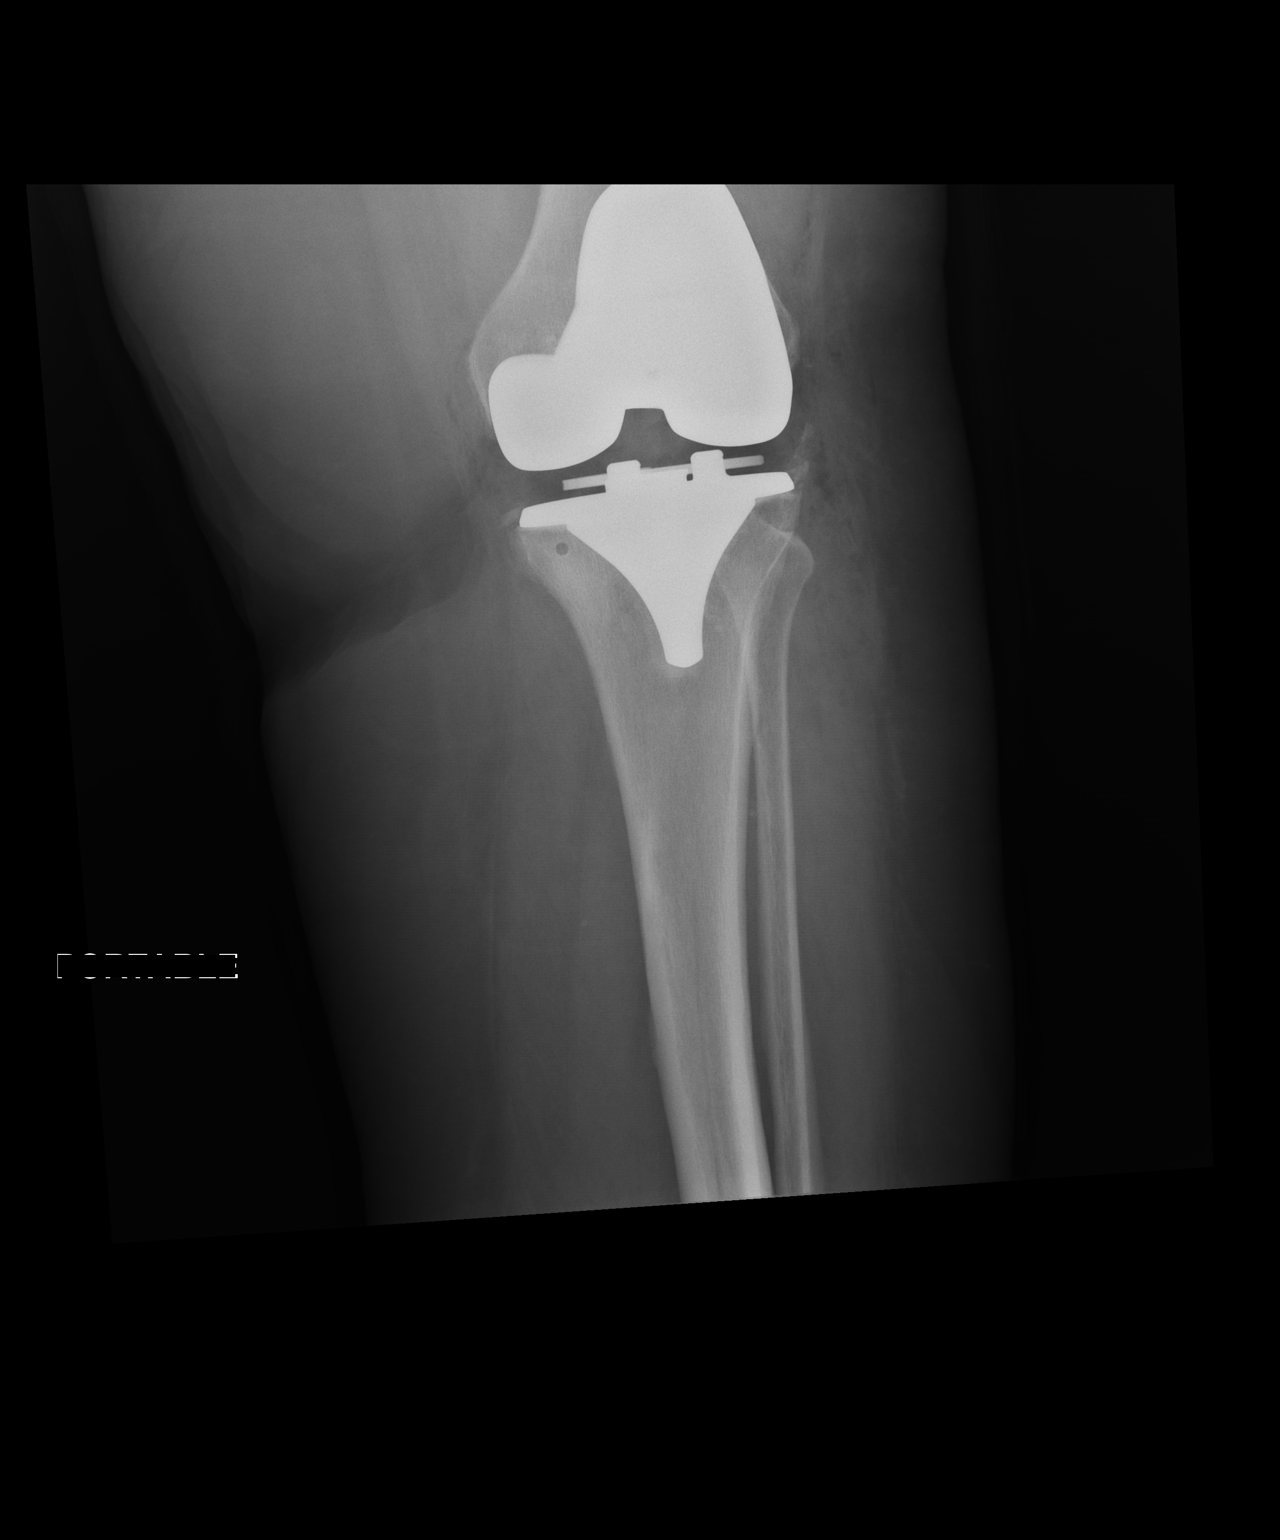

[xtable lateral]
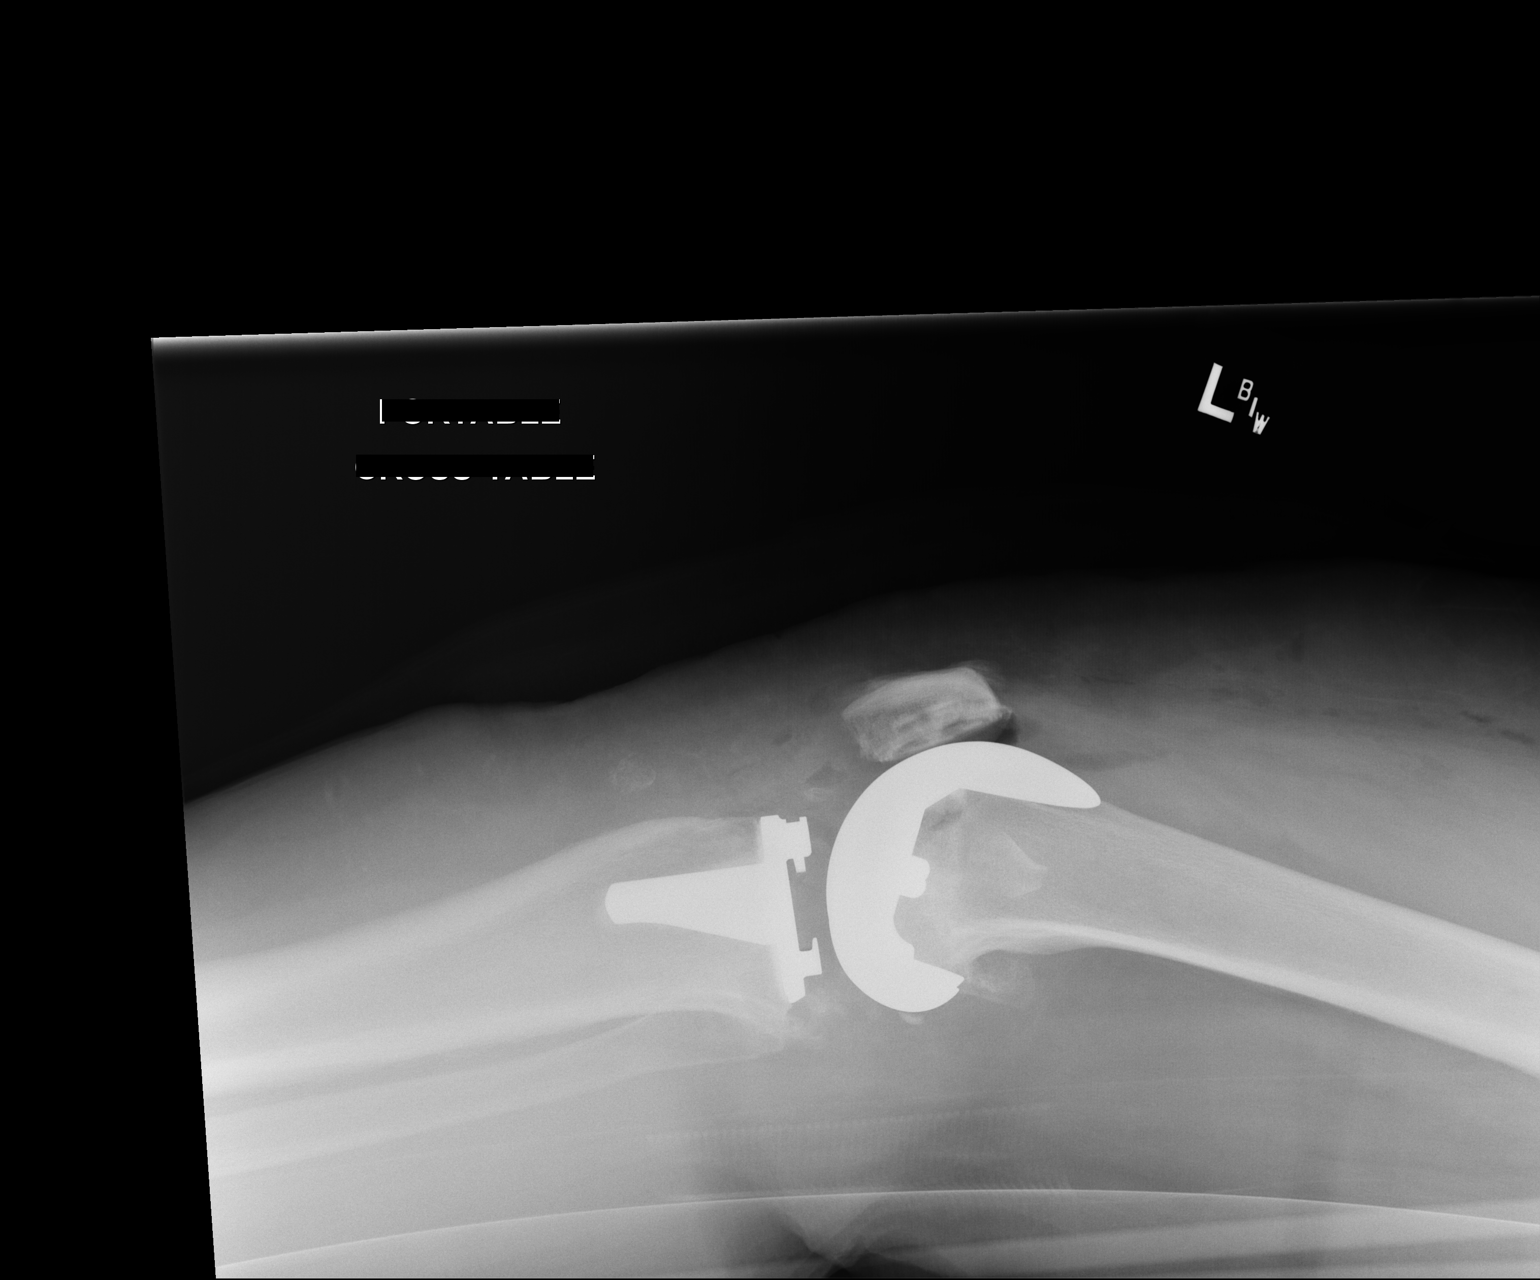

[2 of 2 positions shown; findings below may reference images not displayed]

FINDINGS: Total knee arthroplasty and patellar resurfacing with prosthesis. No
acute fracture or hardware related complication is identified. 6 mm
bony density at the medial margin of the tibial prosthesis probably
represents an intra-articular body or fragment. Expected
postsurgical changes with joint effusion with air and edema and air
in the surrounding soft tissues.
IMPRESSION: Total knee arthroplasty with expected postsurgical changes.

By: Lona Tor M.D.

## 2017-07-20 ENCOUNTER — Encounter: Payer: Self-pay | Admitting: Family Medicine

## 2017-07-22 MED ORDER — LIRAGLUTIDE -WEIGHT MANAGEMENT 18 MG/3ML ~~LOC~~ SOPN: 3.0000 mg | PEN_INJECTOR | Freq: Every day | SUBCUTANEOUS | 1 refills | Status: DC

## 2017-08-11 ENCOUNTER — Other Ambulatory Visit: Payer: Self-pay | Admitting: Family Medicine

## 2017-08-12 MED ORDER — LEVOTHYROXINE SODIUM 150 MCG PO TABS: 150.0000 ug | ORAL_TABLET | Freq: Every day | ORAL | 1 refills | Status: DC

## 2017-09-13 ENCOUNTER — Ambulatory Visit: Payer: Self-pay

## 2017-09-13 ENCOUNTER — Encounter: Payer: Self-pay | Admitting: Family Medicine

## 2017-09-13 ENCOUNTER — Ambulatory Visit: Payer: PRIVATE HEALTH INSURANCE | Admitting: Family Medicine

## 2017-09-13 VITALS — BP 138/67 | HR 88 | Temp 97.9°F | Resp 16 | Ht 68.0 in | Wt 320.4 lb

## 2017-09-13 DIAGNOSIS — T63461A Toxic effect of venom of wasps, accidental (unintentional), initial encounter: Secondary | ICD-10-CM

## 2017-09-13 DIAGNOSIS — L03114 Cellulitis of left upper limb: Secondary | ICD-10-CM | POA: Diagnosis not present

## 2017-09-13 MED ORDER — PREDNISONE 10 MG PO TABS: ORAL_TABLET | ORAL | 0 refills | Status: DC

## 2017-09-13 MED ORDER — FLUCONAZOLE 150 MG PO TABS: 150.0000 mg | ORAL_TABLET | Freq: Once | ORAL | 0 refills | Status: AC

## 2017-09-13 MED ORDER — CEPHALEXIN 500 MG PO CAPS: 500.0000 mg | ORAL_CAPSULE | Freq: Four times a day (QID) | ORAL | 0 refills | Status: DC

## 2017-09-13 NOTE — Progress Notes (Signed)
Patient ID: Kristin Hubbard, female   DOB: 11-07-1960, 57 y.o.   MRN: [id number]     Subjective:  I acted as a Neurosurgeon for Dr. Zola Button.  Apolonio Schneiders, CMA   Patient ID: Kristin Hubbard, female    DOB: [birth year], 57 y.o.   MRN: [id number]  Chief Complaint  Patient presents with  . Insect Bite    left arm    HPI  Patient is in today for insect bite on left forearm yesterday.  Pt was stung on L forearm and arm is red and hot --tender to touch.   No fevers   Patient Care Team: Zola Button, Grayling Congress, DO as PCP - General Teryl Lucy, MD as Consulting Physician (Orthopedic Surgery)   Past Medical History:  Diagnosis Date  . Anxiety   . Bilateral primary osteoarthritis of knee 01/31/2016  . Complication of anesthesia    after 1 st surgery was combative,other surgeries have been fine  . Hypertension   . Hypothyroidism   . Migraine   . Thyroid disease    Hypothyroidism    Past Surgical History:  Procedure Laterality Date  . BUNIONECTOMY     hammer toe  . CHOLECYSTECTOMY    . KNEE SURGERY  05/2007   left  . TOTAL KNEE ARTHROPLASTY Bilateral 01/31/2016   Procedure: TOTAL KNEE BILATERAL;  Surgeon: Teryl Lucy, MD;  Location: [facility] OR;  Service: Orthopedics;  Laterality: Bilateral;    Family History  Problem Relation Age of Onset  . Diabetes Unknown   . Alcohol abuse Father   . Diabetes Father   . Alcohol abuse Mother     Social History   Socioeconomic History  . Marital status: Single    Spouse name: Not on file  . Number of children: Not on file  . Years of education: Not on file  . Highest education level: Not on file  Occupational History  . Occupation: Database administrator: [employer]  Social Needs  . Financial resource strain: Not on file  . Food insecurity:    Worry: Not on file    Inability: Not on file  . Transportation needs:    Medical: Not on file    Non-medical: Not on file  Tobacco Use  . Smoking status: Current Every Day Smoker    Packs/day:  1.00    Years: 17.00    Pack years: 17.00    Types: Cigarettes  . Smokeless tobacco: Never Used  . Tobacco comment: she said when she loses 100 lbs she will quit smoking  Substance and Sexual Activity  . Alcohol use: No  . Drug use: No  . Sexual activity: Not Currently    Partners: Male  Lifestyle  . Physical activity:    Days per week: Not on file    Minutes per session: Not on file  . Stress: Not on file  Relationships  . Social connections:    Talks on phone: Not on file    Gets together: Not on file    Attends religious service: Not on file    Active member of club or organization: Not on file    Attends meetings of clubs or organizations: Not on file    Relationship status: Not on file  . Intimate partner violence:    Fear of current or ex partner: Not on file    Emotionally abused: Not on file    Physically abused: Not on file    Forced sexual activity: Not on file  Other Topics Concern  . Not on file  Social History Narrative   Exercise--  Trainer and gym    Outpatient Medications Prior to Visit  Medication Sig Dispense Refill  . amLODipine (NORVASC) 5 MG tablet Take 1 tablet (5 mg total) by mouth daily. 90 tablet 1  . Insulin Pen Needle (BD PEN NEEDLE NANO U/F) 32G X 4 MM MISC Use  1 pen needle once daily with Saxenda 100 each 1  . levothyroxine (SYNTHROID, LEVOTHROID) 150 MCG tablet Take 1 tablet (150 mcg total) by mouth daily before breakfast. 90 tablet 1  . Liraglutide -Weight Management (SAXENDA) 18 MG/3ML SOPN Inject 3 mg into the skin daily. 15 mL 1  . simvastatin (ZOCOR) 40 MG tablet Take 1 tablet (40 mg total) by mouth at bedtime. 90 tablet 1   No facility-administered medications prior to visit.     Allergies  Allergen Reactions  . Lisinopril Cough  . Vicodin [Hydrocodone-Acetaminophen]     Pt stated, "makes me constipated"    Review of Systems  Constitutional: Negative for fever and malaise/fatigue.  HENT: Negative for congestion.   Eyes:  Negative for blurred vision.  Respiratory: Negative for cough and shortness of breath.   Cardiovascular: Negative for chest pain, palpitations and leg swelling.  Gastrointestinal: Negative for vomiting.  Musculoskeletal: Negative for back pain.  Skin: Positive for itching and rash.  Neurological: Negative for loss of consciousness and headaches.       Objective:    Physical Exam  Constitutional: She is oriented to person, place, and time. She appears well-developed and well-nourished.  HENT:  Head: Normocephalic and atraumatic.  Eyes: Conjunctivae and EOM are normal.  Neck: Normal range of motion. Neck supple. No JVD present. Carotid bruit is not present. No thyromegaly present.  Cardiovascular: Normal rate, regular rhythm and normal heart sounds.  No murmur heard. Pulmonary/Chest: Effort normal and breath sounds normal. No respiratory distress. She has no wheezes. She has no rales. She exhibits no tenderness.  Musculoskeletal: She exhibits no edema.  Neurological: She is alert and oriented to person, place, and time.  Skin: Skin is warm. There is erythema.  Psychiatric: She has a normal mood and affect.  Nursing note and vitals reviewed.       BP 138/67 (BP Location: Right Arm, Cuff Size: Large)   Pulse 88   Temp 97.9 F (36.6 C) (Oral)   Resp 16   Ht 5\' 8"  (1.727 m)   Wt (!) 320 lb 6.4 oz (145.3 kg)   SpO2 99%   BMI 48.72 kg/m  Wt Readings from Last 3 Encounters:  09/13/17 (!) 320 lb 6.4 oz (145.3 kg)  04/29/17 (!) 318 lb 9.6 oz (144.5 kg)  10/05/16 (!) 312 lb 12.8 oz (141.9 kg)   BP Readings from Last 3 Encounters:  09/14/17 132/80  09/13/17 138/67  04/29/17 138/86     Immunization History  Administered Date(s) Administered  . PPD Test 04/14/2012  . Td 01/23/1996    Health Maintenance  Topic Date Due  . PNEUMOCOCCAL POLYSACCHARIDE VACCINE AGE 21-64 HIGH RISK  01/19/1962  . FOOT EXAM  01/19/1970  . OPHTHALMOLOGY EXAM  01/19/1970  . URINE MICROALBUMIN   01/19/1970  . HIV Screening  01/20/1975  . MAMMOGRAM  06/25/2010  . HEMOGLOBIN A1C  07/21/2013  . PAP SMEAR  06/06/2017  . INFLUENZA VACCINE  08/22/2017  . COLONOSCOPY  04/15/2022  . TETANUS/TDAP  04/15/2022  . Hepatitis C Screening  Completed    Lab Results  Component Value Date   WBC 7.2 02/03/2016   HGB 9.3 (L) 02/03/2016   HCT 28.0 (L) 02/03/2016   PLT 145 (L) 02/03/2016   GLUCOSE 115 (H) 10/05/2016   CHOL 168 10/05/2016   TRIG 157.0 (H) 10/05/2016   HDL 47.80 10/05/2016   LDLDIRECT 173.6 08/22/2011   LDLCALC 89 10/05/2016   ALT 17 10/05/2016   AST 16 10/05/2016   NA 137 10/05/2016   K 3.8 10/05/2016   CL 103 10/05/2016   CREATININE 0.83 10/05/2016   BUN 16 10/05/2016   CO2 24 10/05/2016   TSH 2.11 10/05/2016   HGBA1C 5.6 01/20/2013    Lab Results  Component Value Date   TSH 2.11 10/05/2016   Lab Results  Component Value Date   WBC 7.2 02/03/2016   HGB 9.3 (L) 02/03/2016   HCT 28.0 (L) 02/03/2016   MCV 94.3 02/03/2016   PLT 145 (L) 02/03/2016   Lab Results  Component Value Date   NA 137 10/05/2016   K 3.8 10/05/2016   CO2 24 10/05/2016   GLUCOSE 115 (H) 10/05/2016   BUN 16 10/05/2016   CREATININE 0.83 10/05/2016   BILITOT 0.4 10/05/2016   ALKPHOS 83 10/05/2016   AST 16 10/05/2016   ALT 17 10/05/2016   PROT 7.5 10/05/2016   ALBUMIN 4.2 10/05/2016   CALCIUM 9.6 10/05/2016   ANIONGAP 7 02/01/2016   GFR 75.39 10/05/2016   Lab Results  Component Value Date   CHOL 168 10/05/2016   Lab Results  Component Value Date   HDL 47.80 10/05/2016   Lab Results  Component Value Date   LDLCALC 89 10/05/2016   Lab Results  Component Value Date   TRIG 157.0 (H) 10/05/2016   Lab Results  Component Value Date   CHOLHDL 4 10/05/2016   Lab Results  Component Value Date   HGBA1C 5.6 01/20/2013         Assessment & Plan:   Problem List Items Addressed This Visit    None    Visit Diagnoses    Cellulitis of left upper extremity    -  Primary     Relevant Medications   cephALEXin (KEFLEX) 500 MG capsule   Wasp sting, accidental or unintentional, initial encounter       Relevant Medications   predniSONE (DELTASONE) 10 MG tablet    if redness worsens --- go to sat clinic /or ER   I am having Hubert Azure start on cephALEXin, predniSONE, and fluconazole. I am also having her maintain her Insulin Pen Needle, simvastatin, amLODipine, Liraglutide -Weight Management, and levothyroxine.  Meds ordered this encounter  Medications  . cephALEXin (KEFLEX) 500 MG capsule    Sig: Take 1 capsule (500 mg total) by mouth 4 (four) times daily.    Dispense:  28 capsule    Refill:  0  . predniSONE (DELTASONE) 10 MG tablet    Sig: TAKE 3 TABLETS PO QD FOR 3 DAYS THEN TAKE 2 TABLETS PO QD FOR 3 DAYS THEN TAKE 1 TABLET PO QD FOR 3 DAYS THEN TAKE 1/2 TAB PO QD FOR 3 DAYS    Dispense:  20 tablet    Refill:  0  . fluconazole (DIFLUCAN) 150 MG tablet    Sig: Take 1 tablet (150 mg total) by mouth once for 1 dose. May repeat in 3 days prn    Dispense:  2 tablet    Refill:  0   CMA served as scribe during this visit. History, Physical and Plan  performed by medical provider. Documentation and orders reviewed and attested to.  Ann Held, DO

## 2017-09-13 NOTE — Telephone Encounter (Signed)
Pt. Reports she was stung by a wasp yesterday on her left arm below the antecubital area. The area of redness and swelling continues to worsen. It is greater than 6 inches in diameter.Reports her "whole arm is achy." Denies any shortness of breath or other symptoms.Took one Benadryl 2 hours ago. Appointment made for today with her provider.  Reason for Disposition . [1] Red or very tender (to touch) area AND [2] started over 24 hours after the sting  Answer Assessment - Initial Assessment Questions 1. TYPE: "What type of sting was it?" (bee, yellow jacket, etc.)      Wasp 2. ONSET: "When did it occur?"      yesterday 3. LOCATION: "Where is the sting located?"  "How many stings?"     Left arm at the antecubital 4. SWELLING SIZE: "How big is the swelling?" (inches or centimeters)    >  6 inch area of redness and swelling 5. REDNESS: "Is the area red or pink?" If so, ask "What size is area of redness?" (inches or cm). "When did the redness start?"    > 6 inch 6. PAIN: "Is there any pain?" If so, ask: "How bad is it?"  (Scale 1-10; or mild, moderate, severe)      No pain - arm is achy 7. ITCHING: "Is there any itching?" If so, ask: "How bad is it?"      Minimal 8. RESPIRATORY DISTRESS: "Describe your breathing."      No 9. PRIOR REACTIONS: "Have you had any severe allergic reactions to stings in the past?" if yes, ask: "What happened?"     No 10. OTHER SYMPTOMS: "Do you have any other symptoms?" (e.g., face or tongue swelling, new rash elsewhere, abdominal pain, vomiting)       No 11. PREGNANCY: "Is there any chance you are pregnant?" "When was your last menstrual period?"       No  Protocols used: BEE OR YELLOW JACKET STING-A-AH

## 2017-09-13 NOTE — Patient Instructions (Signed)
Bee, Wasp, or [employer], Adult Bees, wasps, and hornets are part of a family of insects that can sting people. These stings can cause pain and inflammation, but they are usually not serious. However, some people may have an allergic reaction to a sting. This can cause the symptoms to be more severe. What increases the risk? You may be at a greater risk of getting stung if you:  Provoke a stinging insect by swatting or disturbing it.  Wear strong-smelling soaps, deodorants, or body sprays.  Spend time outdoors near gardens with flowers or fruit trees or in clothes that expose skin.  Eat or drink outside.  What are the signs or symptoms? Common symptoms of this condition include:  A red lump in the skin that sometimes has a tiny hole in the center. In some cases, a stinger may be in the center of the wound.  Pain and itching at the sting site.  Redness and swelling around the sting site. If you have an allergic reaction (localized allergic reaction), the swelling and redness may spread out from the sting site. In some cases, this reaction can continue to develop over the next 24-48 hours.  In rare cases, a person may have a severe allergic reaction (anaphylactic reaction) to a sting. Symptoms of an anaphylactic reaction may include:  Wheezing or difficulty breathing.  Raised, itchy, red patches on the skin (hives).  Nausea or vomiting.  Abdominal cramping.  Diarrhea.  Tightness in the chest or chest pain.  Dizziness or fainting.  Redness of the face (flushing).  Hoarse voice.  Swollen tongue, lips, or face.  How is this diagnosed? This condition is usually diagnosed based on your symptoms and medical history as well as a physical exam. You may have an allergy test to determine if you are allergic to the substance that the insect injected during the sting (venom). How is this treated? If you were stung by a bee, the stinger and a small sac of venom may be in the wound.  It is important to remove the stinger as soon as possible. You can do this by brushing across the wound with gauze, a fingernail, or a flat card such as a credit card. Removing the stinger can help reduce the severity of your body's reaction to the sting. Most stings can be treated with:  Icing to reduce swelling in the area.  Medicines (antihistamines) to treat itching or an allergic reaction.  Medicines to help reduce pain. These may be medicines that you take by mouth, or medicated creams or lotions that you apply to your skin.  Pay close attention to your symptoms after you have been stung. If possible, have someone stay with you to make sure you do not have an allergic reaction. If you have any signs of an allergic reaction, call your health care provider. If you have ever had a severe allergic reaction, your health care provider may give you an inhaler or injectable medicine (epinephrine auto-injector) to use if necessary. Follow these instructions at home:  Wash the sting site 2-3 times each day with soap and water as told by your health care provider.  Apply or take over-the-counter and prescription medicines only as told by your health care provider.  If directed, apply ice to the sting area. ? Put ice in a plastic bag. ? Place a towel between your skin and the bag. ? Leave the ice on for 20 minutes, 2-3 times a day.  Do not scratch the sting  area.  If you had a severe allergic reaction to a sting, you may need: ? To wear a medical bracelet or necklace that lists the allergy. ? To learn when and how to use an anaphylaxis kit or epinephrine injection. Your family members and coworkers may also need to learn this. ? To carry an anaphylaxis kit or epinephrine injection with you at all times. How is this prevented?  Avoid swatting at stinging insects and disturbing insect nests.  Do not use fragrant soaps or lotions.  Wear shoes, pants, and long sleeves when spending time  outdoors, especially in grassy areas where stinging insects are common.  Keep outdoor areas free from nests or hives.  Keep food and drink containers covered when eating outdoors.  Avoid working or sitting near [employer], if possible.  Wear gloves if you are gardening or working outdoors.  If an attack by a stinging insect or a swarm seems likely in the moment, move away from the area or find a barrier between you and the insect(s), such as a door. Contact a health care provider if:  Your symptoms do not get better in 2-3 days.  You have redness, swelling, or pain that spreads beyond the area of the sting.  You have a fever. Get help right away if: You have symptoms of a severe allergic reaction. These include:  Wheezing or difficulty breathing.  Tightness in the chest or chest pain.  Light-headedness or fainting.  Itchy, raised, red patches on the skin.  Nausea or vomiting.  Abdominal cramping.  Diarrhea.  A swollen tongue or lips, or trouble swallowing.  Dizziness or fainting.  Summary  Stings from bees, wasps, and hornets can cause pain and inflammation, but they are usually not serious. However, some people may have an allergic reaction to a sting. This can cause the symptoms to be more severe.  Pay close attention to your symptoms after you have been stung. If possible, have someone stay with you to make sure you do not have an allergic reaction.  Call your health care provider if you have any signs of an allergic reaction. This information is not intended to replace advice given to you by your health care provider. Make sure you discuss any questions you have with your health care provider. Document Released: 01/08/2005 Document Revised: 03/15/2016 Document Reviewed: 03/15/2016 Elsevier Interactive Patient Education  [employer].

## 2017-09-14 ENCOUNTER — Ambulatory Visit: Payer: PRIVATE HEALTH INSURANCE | Admitting: Family Medicine

## 2017-09-14 ENCOUNTER — Encounter: Payer: Self-pay | Admitting: Family Medicine

## 2017-09-14 VITALS — BP 132/80 | HR 100 | Temp 98.3°F | Ht 68.0 in

## 2017-09-14 DIAGNOSIS — L03114 Cellulitis of left upper limb: Secondary | ICD-10-CM | POA: Diagnosis not present

## 2017-09-14 DIAGNOSIS — T63461D Toxic effect of venom of wasps, accidental (unintentional), subsequent encounter: Secondary | ICD-10-CM

## 2017-09-14 MED ORDER — DOXYCYCLINE HYCLATE 100 MG PO TABS: 100.0000 mg | ORAL_TABLET | Freq: Two times a day (BID) | ORAL | 0 refills | Status: DC

## 2017-09-14 NOTE — Progress Notes (Signed)
   Kristin Hubbard is a 57 y.o. female here for an acute visit.  History of Present Illness:   HPI:  Wasp sting a few days ago. Started Keflex and Prednisone yesterday. Margins marked. Was offered injection but declined. Slept on left side last night. Redness extending past margins. No fever, chills, N/V/D, edema, CP, SOB, wheeze.   PMHx, SurgHx, SocialHx, Medications, and Allergies were reviewed in the Visit Navigator and updated as appropriate.  Current Medications:   .  amLODipine (NORVASC) 5 MG tablet, Take 1 tablet (5 mg total) by mouth daily., Disp: 90 tablet, Rfl: 1 .  cephALEXin (KEFLEX) 500 MG capsule, Take 1 capsule (500 mg total) by mouth 4 (four) times daily., Disp: 28 capsule, Rfl: 0 .  Insulin Pen Needle (BD PEN NEEDLE NANO U/F) 32G X 4 MM MISC, Use  1 pen needle once daily with Saxenda, Disp: 100 each, Rfl: 1 .  levothyroxine (SYNTHROID, LEVOTHROID) 150 MCG tablet, Take 1 tablet (150 mcg total) by mouth daily before breakfast., Disp: 90 tablet, Rfl: 1 .  Liraglutide -Weight Management (SAXENDA) 18 MG/3ML SOPN, Inject 3 mg into the skin daily., Disp: 15 mL, Rfl: 1 .  predniSONE (DELTASONE) 10 MG tablet, TAKE 3 TABLETS PO QD FOR 3 DAYS THEN TAKE 2 TABLETS PO QD FOR 3 DAYS THEN TAKE 1 TABLET PO QD FOR 3 DAYS THEN TAKE 1/2 TAB PO QD FOR 3 DAYS, Disp: 20 tablet, Rfl: 0 .  simvastatin (ZOCOR) 40 MG tablet, Take 1 tablet (40 mg total) by mouth at bedtime., Disp: 90 tablet, Rfl: 1  Allergies  Allergen Reactions  . Lisinopril Cough  . Vicodin [Hydrocodone-Acetaminophen]     Pt stated, "makes me constipated"   Review of Systems:   Pertinent items are noted in the HPI. Otherwise, ROS is negative.  Vitals:   Vitals:   09/14/17 0955  BP: 132/80  Pulse: 100  Temp: 98.3 F (36.8 C)  TempSrc: Oral  SpO2: 97%  Height: 5\' 8"  (1.727 m)     Body mass index is 48.72 kg/m.  Physical Exam:   Physical Exam  Constitutional: She appears well-nourished.  HENT:  Head: Normocephalic  and atraumatic.  Eyes: Pupils are equal, round, and reactive to light. EOM are normal.  Neck: Normal range of motion. Neck supple.  Cardiovascular: Normal rate, regular rhythm, normal heart sounds and intact distal pulses.  Pulmonary/Chest: Effort normal.  Abdominal: Soft.  Skin: Skin is warm.  See pic. Erythema has spread but not induration. No fluctuance or streaking.   Psychiatric: She has a normal mood and affect. Her behavior is normal.  Nursing note and vitals reviewed.   Assessment and Plan:   [name] was seen today for insect bite.  Diagnoses and all orders for this visit:  Wasp sting, accidental or unintentional, subsequent encounter Comments: See AVS.  Cellulitis of left upper extremity Comments: See AVS. Orders: -     doxycycline (VIBRA-TABS) 100 MG tablet; Take 1 tablet (100 mg total) by mouth 2 (two) times daily.   . Reviewed expectations re: course of current medical issues. . Discussed self-management of symptoms. . Outlined signs and symptoms indicating need for more acute intervention. . Patient verbalized understanding and all questions were answered. Kristin Hubbard. Health Maintenance issues including appropriate healthy diet, exercise, and smoking avoidance were discussed with patient. . See orders for this visit as documented in the electronic medical record. . Patient received an After Visit Summary.  Helane RimaErica Aikam Vinje, DO [place] [place] [facility] 09/14/2017

## 2017-09-14 NOTE — Patient Instructions (Signed)
I am reassured that your arm is stable. I am changing your antibiotic to Doxycycline. Continue the Prednisone. Take Benadryl every 8-12 hours as needed over the weekend. Cool compresses can help as well. If your symptoms worsen, go to the Emergency Department.

## 2017-09-16 ENCOUNTER — Telehealth: Payer: Self-pay

## 2017-09-16 NOTE — Telephone Encounter (Signed)
Follow up call made to patient regarding bee sting. States she started the stronger ABT the area has gone down in size and she does not feel she needs t be seen at this time. Advised to call office if anything changes.

## 2017-10-10 ENCOUNTER — Encounter: Payer: Self-pay | Admitting: Family Medicine

## 2017-10-10 MED ORDER — LIRAGLUTIDE -WEIGHT MANAGEMENT 18 MG/3ML ~~LOC~~ SOPN: 3.0000 mg | PEN_INJECTOR | Freq: Every day | SUBCUTANEOUS | 1 refills | Status: DC

## 2017-10-10 NOTE — Telephone Encounter (Signed)
Ok to refill 

## 2017-12-17 ENCOUNTER — Encounter: Payer: Self-pay | Admitting: Family Medicine

## 2017-12-20 NOTE — Telephone Encounter (Signed)
Who is her sister?  She sent message in her chart not her sisters

## 2017-12-23 NOTE — Telephone Encounter (Signed)
Kristin Hubbard DOB: [birth year]

## 2017-12-23 NOTE — Telephone Encounter (Signed)
That is fine 

## 2017-12-24 NOTE — Telephone Encounter (Signed)
Scheduled pt appt 01/13/18 @ 2pm

## 2017-12-24 NOTE — Telephone Encounter (Signed)
Copied information to [employer] Burroughs chart.

## 2018-01-14 ENCOUNTER — Encounter: Payer: Self-pay | Admitting: Family Medicine

## 2018-01-14 DIAGNOSIS — I10 Essential (primary) hypertension: Secondary | ICD-10-CM

## 2018-01-14 DIAGNOSIS — E785 Hyperlipidemia, unspecified: Secondary | ICD-10-CM

## 2018-01-17 MED ORDER — LIRAGLUTIDE -WEIGHT MANAGEMENT 18 MG/3ML ~~LOC~~ SOPN: 3.0000 mg | PEN_INJECTOR | Freq: Every day | SUBCUTANEOUS | 1 refills | Status: DC

## 2018-01-17 MED ORDER — AMLODIPINE BESYLATE 5 MG PO TABS: 5.0000 mg | ORAL_TABLET | Freq: Every day | ORAL | 1 refills | Status: DC

## 2018-01-17 MED ORDER — SIMVASTATIN 40 MG PO TABS: 40.0000 mg | ORAL_TABLET | Freq: Every day | ORAL | 1 refills | Status: DC

## 2018-01-30 ENCOUNTER — Encounter: Payer: Self-pay | Admitting: Family Medicine

## 2018-01-31 NOTE — Telephone Encounter (Signed)
She can come in for ov and we can do labs--- but I would lower dose and see if it goes away--- she can also stop it and start over when the symptoms subside to see what dose she can tolerate

## 2018-02-10 ENCOUNTER — Encounter: Payer: Self-pay | Admitting: Family Medicine

## 2018-02-10 ENCOUNTER — Ambulatory Visit: Payer: PRIVATE HEALTH INSURANCE | Admitting: Family Medicine

## 2018-02-10 ENCOUNTER — Telehealth: Payer: Self-pay | Admitting: Family Medicine

## 2018-02-10 VITALS — BP 126/86 | HR 98 | Temp 98.1°F | Resp 18 | Ht 68.0 in | Wt 321.8 lb

## 2018-02-10 DIAGNOSIS — R739 Hyperglycemia, unspecified: Secondary | ICD-10-CM

## 2018-02-10 DIAGNOSIS — R1032 Left lower quadrant pain: Secondary | ICD-10-CM

## 2018-02-10 DIAGNOSIS — E785 Hyperlipidemia, unspecified: Secondary | ICD-10-CM | POA: Diagnosis not present

## 2018-02-10 DIAGNOSIS — Z720 Tobacco use: Secondary | ICD-10-CM

## 2018-02-10 LAB — CBC WITH DIFFERENTIAL/PLATELET
Basophils Absolute: 0.1 10*3/uL (ref 0.0–0.1)
Basophils Relative: 1.1 % (ref 0.0–3.0)
EOS ABS: 0.1 10*3/uL (ref 0.0–0.7)
Eosinophils Relative: 2.4 % (ref 0.0–5.0)
HCT: 43.4 % (ref 36.0–46.0)
Hemoglobin: 14.8 g/dL (ref 12.0–15.0)
LYMPHS ABS: 1.8 10*3/uL (ref 0.7–4.0)
Lymphocytes Relative: 28.6 % (ref 12.0–46.0)
MCHC: 34.1 g/dL (ref 30.0–36.0)
MCV: 94.3 fl (ref 78.0–100.0)
MONO ABS: 0.4 10*3/uL (ref 0.1–1.0)
Monocytes Relative: 7.2 % (ref 3.0–12.0)
NEUTROS ABS: 3.8 10*3/uL (ref 1.4–7.7)
NEUTROS PCT: 60.7 % (ref 43.0–77.0)
PLATELETS: 176 10*3/uL (ref 150.0–400.0)
RBC: 4.6 Mil/uL (ref 3.87–5.11)
RDW: 13.3 % (ref 11.5–15.5)
WBC: 6.2 10*3/uL (ref 4.0–10.5)

## 2018-02-10 LAB — COMPREHENSIVE METABOLIC PANEL
ALBUMIN: 4.2 g/dL (ref 3.5–5.2)
ALT: 24 U/L (ref 0–35)
AST: 18 U/L (ref 0–37)
Alkaline Phosphatase: 86 U/L (ref 39–117)
BUN: 16 mg/dL (ref 6–23)
CHLORIDE: 105 meq/L (ref 96–112)
CO2: 25 mEq/L (ref 19–32)
Calcium: 9.3 mg/dL (ref 8.4–10.5)
Creatinine, Ser: 0.73 mg/dL (ref 0.40–1.20)
GFR: 81.87 mL/min (ref >60.00)
Glucose, Bld: 103 mg/dL — ABNORMAL HIGH (ref 70–99)
POTASSIUM: 4.2 meq/L (ref 3.5–5.1)
SODIUM: 139 meq/L (ref 135–145)
Total Bilirubin: 0.4 mg/dL (ref 0.2–1.2)
Total Protein: 6.9 g/dL (ref 6.0–8.3)

## 2018-02-10 LAB — HEMOGLOBIN A1C: Hgb A1c MFr Bld: 6 % (ref 4.6–6.5)

## 2018-02-10 LAB — LIPID PANEL
Cholesterol: 135 mg/dL (ref 0–200)
HDL: 34.8 mg/dL — AB (ref >39.00)
LDL Cholesterol: 76 mg/dL (ref 0–99)
NonHDL: 99.89
Total CHOL/HDL Ratio: 4
Triglycerides: 118 mg/dL (ref 0.0–149.0)
VLDL: 23.6 mg/dL (ref 0.0–40.0)

## 2018-02-10 LAB — LIPASE: LIPASE: 133 U/L — AB (ref 11.0–59.0)

## 2018-02-10 LAB — AMYLASE: AMYLASE: 69 U/L (ref 27–131)

## 2018-02-10 MED ORDER — NICOTINE 21 MG/24HR TD PT24: 21.0000 mg | MEDICATED_PATCH | Freq: Every day | TRANSDERMAL | 0 refills | Status: DC

## 2018-02-10 NOTE — Progress Notes (Signed)
Patient ID: Kristin Hubbard, female    DOB: [birth year]  Age: 58 y.o. MRN: [id number]    Subjective:  Subjective  HPI Kristin Hubbard presents for abd pain --LLQ since being on saxenda.  She lowered the dose to 2.4 and the pain has been gone the last few days.  No NVD  Review of Systems  Constitutional: Negative for appetite change, diaphoresis, fatigue and unexpected weight change.  Eyes: Negative for pain, redness and visual disturbance.  Respiratory: Negative for cough, chest tightness, shortness of breath and wheezing.   Cardiovascular: Negative for chest pain, palpitations and leg swelling.  Gastrointestinal: Positive for abdominal pain. Negative for abdominal distention, anal bleeding, blood in stool, constipation, diarrhea, nausea, rectal pain and vomiting.  Endocrine: Negative for cold intolerance, heat intolerance, polydipsia, polyphagia and polyuria.  Genitourinary: Negative for difficulty urinating, dysuria and frequency.  Neurological: Negative for dizziness, light-headedness, numbness and headaches.    History Past Medical History:  Diagnosis Date  . Anxiety   . Bilateral primary osteoarthritis of knee 01/31/2016  . Complication of anesthesia    after 1 st surgery was combative,other surgeries have been fine  . Hypertension   . Hypothyroidism   . Migraine   . Thyroid disease    Hypothyroidism    She has a past surgical history that includes Cholecystectomy; Knee surgery (05/2007); Bunionectomy; and Total knee arthroplasty (Bilateral, 01/31/2016).   Her family history includes Alcohol abuse in her father and mother; Diabetes in her father and unknown relative.She reports that she has been smoking cigarettes. She has a 17.00 pack-year smoking history. She has never used smokeless tobacco. She reports that she does not drink alcohol or use drugs.  Current Outpatient Medications on File Prior to Visit  Medication Sig Dispense Refill  . amLODipine (NORVASC) 5 MG tablet Take 1  tablet (5 mg total) by mouth daily. 90 tablet 1  . Insulin Pen Needle (BD PEN NEEDLE NANO U/F) 32G X 4 MM MISC Use  1 pen needle once daily with Saxenda 100 each 1  . levothyroxine (SYNTHROID, LEVOTHROID) 150 MCG tablet Take 1 tablet (150 mcg total) by mouth daily before breakfast. 90 tablet 1  . Liraglutide -Weight Management (SAXENDA) 18 MG/3ML SOPN Inject 3 mg into the skin daily. 15 mL 1  . simvastatin (ZOCOR) 40 MG tablet Take 1 tablet (40 mg total) by mouth at bedtime. 90 tablet 1   No current facility-administered medications on file prior to visit.      Objective:  Objective  Physical Exam Vitals signs and nursing note reviewed.  Constitutional:      Appearance: She is well-developed.  HENT:     Head: Normocephalic and atraumatic.  Eyes:     Conjunctiva/sclera: Conjunctivae normal.  Neck:     Musculoskeletal: Normal range of motion and neck supple.     Thyroid: No thyromegaly.     Vascular: No carotid bruit or JVD.  Cardiovascular:     Rate and Rhythm: Normal rate and regular rhythm.     Heart sounds: Normal heart sounds. No murmur.  Pulmonary:     Effort: Pulmonary effort is normal. No respiratory distress.     Breath sounds: Normal breath sounds. No wheezing or rales.  Chest:     Chest wall: No tenderness.  Abdominal:     General: Bowel sounds are normal. There is no distension or abdominal bruit. There are no signs of injury.     Palpations: Abdomen is soft.     Tenderness: There is  no abdominal tenderness. There is no right CVA tenderness, left CVA tenderness, guarding or rebound. Negative signs include Murphy's sign, Rovsing's sign, McBurney's sign, psoas sign and obturator sign.  Neurological:     Mental Status: She is alert and oriented to person, place, and time.    BP 126/86 (BP Location: Left Arm, Cuff Size: Large)   Pulse 98   Temp 98.1 F (36.7 C) (Oral)   Resp 18   Ht 5\' 8"  (1.727 m)   Wt (!) 321 lb 12.8 oz (146 kg)   SpO2 96%   BMI 48.93 kg/m  Wt  Readings from Last 3 Encounters:  02/10/18 (!) 321 lb 12.8 oz (146 kg)  09/13/17 (!) 320 lb 6.4 oz (145.3 kg)  04/29/17 (!) 318 lb 9.6 oz (144.5 kg)     Lab Results  Component Value Date   WBC 7.2 02/03/2016   HGB 9.3 (L) 02/03/2016   HCT 28.0 (L) 02/03/2016   PLT 145 (L) 02/03/2016   GLUCOSE 115 (H) 10/05/2016   CHOL 168 10/05/2016   TRIG 157.0 (H) 10/05/2016   HDL 47.80 10/05/2016   LDLDIRECT 173.6 08/22/2011   LDLCALC 89 10/05/2016   ALT 17 10/05/2016   AST 16 10/05/2016   NA 137 10/05/2016   K 3.8 10/05/2016   CL 103 10/05/2016   CREATININE 0.83 10/05/2016   BUN 16 10/05/2016   CO2 24 10/05/2016   TSH 2.11 10/05/2016   HGBA1C 5.6 01/20/2013    No results found.   Assessment & Plan:  Plan  I have discontinued Tinisha Maturin's cephALEXin, predniSONE, and doxycycline. I am also having her start on nicotine. Additionally, I am having her maintain her Insulin Pen Needle, levothyroxine, amLODipine, Liraglutide -Weight Management, and simvastatin.  Meds ordered this encounter  Medications  . nicotine (NICODERM CQ) 21 mg/24hr patch    Sig: Place 1 patch (21 mg total) onto the skin daily.    Dispense:  28 patch    Refill:  0    Problem List Items Addressed This Visit      Unprioritized   Hyperglycemia   Relevant Orders   Hemoglobin A1c   Hyperlipidemia   Relevant Orders   Lipid panel   Left lower quadrant abdominal pain - Primary    Check labs today con't lower dose saxenda ----  If labs abnormal she may need to stop-- she is aware and understands this        Relevant Orders   CBC with Differential/Platelet   Comprehensive metabolic panel   Amylase   Lipase   Tobacco abuse    nicoderm cq patches       Relevant Medications   nicotine (NICODERM CQ) 21 mg/24hr patch      Follow-up: Return in about 3 months (around 05/12/2018), or if symptoms worsen or fail to improve, for annual exam, fasting.  Donato Schultz, DO

## 2018-02-10 NOTE — Assessment & Plan Note (Signed)
Check labs today con't lower dose saxenda ----  If labs abnormal she may need to stop-- she is aware and understands this

## 2018-02-10 NOTE — Assessment & Plan Note (Signed)
nicoderm cq patches

## 2018-02-10 NOTE — Patient Instructions (Signed)
Abdominal Pain, Adult  Abdominal pain can be caused by many things. Often, abdominal pain is not serious and it gets better with no treatment or by being treated at home. However, sometimes abdominal pain is serious. Your health care provider will do a medical history and a physical exam to try to determine the cause of your abdominal pain.  Follow these instructions at home:   Take over-the-counter and prescription medicines only as told by your health care provider. Do not take a laxative unless told by your health care provider.   Drink enough fluid to keep your urine clear or pale yellow.   Watch your condition for any changes.   Keep all follow-up visits as told by your health care provider. This is important.  Contact a health care provider if:   Your abdominal pain changes or gets worse.   You are not hungry or you lose weight without trying.   You are constipated or have diarrhea for more than 2-3 days.   You have pain when you urinate or have a bowel movement.   Your abdominal pain wakes you up at night.   Your pain gets worse with meals, after eating, or with certain foods.   You are throwing up and cannot keep anything down.   You have a fever.  Get help right away if:   Your pain does not go away as soon as your health care provider told you to expect.   You cannot stop throwing up.   Your pain is only in areas of the abdomen, such as the right side or the left lower portion of the abdomen.   You have bloody or black stools, or stools that look like tar.   You have severe pain, cramping, or bloating in your abdomen.   You have signs of dehydration, such as:  ? Dark urine, very little urine, or no urine.  ? Cracked lips.  ? Dry mouth.  ? Sunken eyes.  ? Sleepiness.  ? Weakness.  This information is not intended to replace advice given to you by your health care provider. Make sure you discuss any questions you have with your health care provider.  Document Released: 10/18/2004 Document  Revised: 07/29/2015 Document Reviewed: 06/22/2015  Elsevier Interactive Patient Education  2019 [employer]

## 2018-02-10 NOTE — Telephone Encounter (Signed)
Copied from CRM [id number]. Topic: Quick Communication - See Telephone Encounter >> Feb 10, 2018  2:57 PM Jens Som A wrote: CRM for notification. See Telephone encounter for: 02/10/18.  Patient is calling regarding her lab results has an additional question. Please advise [phone]

## 2018-02-11 ENCOUNTER — Other Ambulatory Visit: Payer: Self-pay | Admitting: Family Medicine

## 2018-02-11 ENCOUNTER — Telehealth: Payer: Self-pay

## 2018-02-11 ENCOUNTER — Encounter: Payer: Self-pay | Admitting: Family Medicine

## 2018-02-11 DIAGNOSIS — E039 Hypothyroidism, unspecified: Secondary | ICD-10-CM

## 2018-02-11 DIAGNOSIS — R748 Abnormal levels of other serum enzymes: Secondary | ICD-10-CM

## 2018-02-11 NOTE — Telephone Encounter (Signed)
Order in.

## 2018-02-11 NOTE — Telephone Encounter (Signed)
D/w pt --- as discussed with pt during ov--- she needs to stop the saxenda ,  Stay on clear liquid diet until labs re done-- she needs app for either tomorrow or Thursday---Please call her to make lab appointment

## 2018-02-11 NOTE — Telephone Encounter (Signed)
Call patient about her results.  Advised her of your notes.  She started getting very anxious and getting upset and asking to speak with provider.  Can you please call patient?

## 2018-02-11 NOTE — Telephone Encounter (Signed)
Please advise 

## 2018-02-11 NOTE — Telephone Encounter (Signed)
Can you call patient to make lab only visit for tomorrow or Thursday?

## 2018-02-11 NOTE — Telephone Encounter (Signed)
Copied from CRM [id number]. Topic: General - Other >> Feb 11, 2018  3:23 PM Jaquita Rector A wrote: Reason for CRM: Patient called to ask if Dr Zola Button can please put orders in for her to have her Thyroids checked. Thank you

## 2018-02-11 NOTE — Telephone Encounter (Signed)
Dr. Laury AxonLowne spoke with patient

## 2018-02-11 NOTE — Telephone Encounter (Signed)
Patient notified that labs were put in.

## 2018-02-11 NOTE — Telephone Encounter (Signed)
Lab appt scheduled 02/12/2018.

## 2018-02-12 ENCOUNTER — Encounter: Payer: Self-pay | Admitting: Family Medicine

## 2018-02-12 ENCOUNTER — Other Ambulatory Visit (INDEPENDENT_AMBULATORY_CARE_PROVIDER_SITE_OTHER): Payer: PRIVATE HEALTH INSURANCE

## 2018-02-12 DIAGNOSIS — E039 Hypothyroidism, unspecified: Secondary | ICD-10-CM

## 2018-02-12 DIAGNOSIS — R748 Abnormal levels of other serum enzymes: Secondary | ICD-10-CM

## 2018-02-12 LAB — COMPREHENSIVE METABOLIC PANEL
ALT: 26 U/L (ref 0–35)
AST: 20 U/L (ref 0–37)
Albumin: 4.2 g/dL (ref 3.5–5.2)
Alkaline Phosphatase: 88 U/L (ref 39–117)
BUN: 11 mg/dL (ref 6–23)
CO2: 27 mEq/L (ref 19–32)
Calcium: 9 mg/dL (ref 8.4–10.5)
Chloride: 103 mEq/L (ref 96–112)
Creatinine, Ser: 0.68 mg/dL (ref 0.40–1.20)
GFR: 88.85 mL/min (ref >60.00)
GLUCOSE: 107 mg/dL — AB (ref 70–99)
Potassium: 4.2 mEq/L (ref 3.5–5.1)
Sodium: 138 mEq/L (ref 135–145)
Total Bilirubin: 0.5 mg/dL (ref 0.2–1.2)
Total Protein: 7.2 g/dL (ref 6.0–8.3)

## 2018-02-12 LAB — AMYLASE: Amylase: 28 U/L (ref 27–131)

## 2018-02-12 LAB — LIPASE: Lipase: 23 U/L (ref 11.0–59.0)

## 2018-02-12 NOTE — Telephone Encounter (Signed)
Loose stools --- a couple --- are ok-----   Hopefully her pain is improving She may need ov

## 2018-02-12 NOTE — Telephone Encounter (Signed)
Dr. Laury Axon spoke with patient on phone 02/11/18

## 2018-02-13 ENCOUNTER — Other Ambulatory Visit: Payer: Self-pay | Admitting: Family Medicine

## 2018-02-13 DIAGNOSIS — R7989 Other specified abnormal findings of blood chemistry: Secondary | ICD-10-CM

## 2018-02-13 LAB — THYROID PANEL WITH TSH
Free Thyroxine Index: 4.8 — ABNORMAL HIGH (ref 1.4–3.8)
T3 Uptake: 31 % (ref 22–35)
T4, Total: 15.5 ug/dL — ABNORMAL HIGH (ref 5.1–11.9)
TSH: 0.46 mIU/L (ref 0.40–4.50)

## 2018-02-18 ENCOUNTER — Other Ambulatory Visit: Payer: Self-pay | Admitting: Family Medicine

## 2018-04-14 ENCOUNTER — Encounter: Payer: Self-pay | Admitting: Family Medicine

## 2018-04-14 DIAGNOSIS — I10 Essential (primary) hypertension: Secondary | ICD-10-CM

## 2018-04-14 DIAGNOSIS — E785 Hyperlipidemia, unspecified: Secondary | ICD-10-CM

## 2018-04-15 MED ORDER — SIMVASTATIN 40 MG PO TABS: 40.0000 mg | ORAL_TABLET | Freq: Every day | ORAL | 1 refills | Status: DC

## 2018-05-09 ENCOUNTER — Encounter: Payer: Self-pay | Admitting: Family Medicine

## 2018-05-09 ENCOUNTER — Other Ambulatory Visit: Payer: Self-pay

## 2018-05-09 ENCOUNTER — Ambulatory Visit (INDEPENDENT_AMBULATORY_CARE_PROVIDER_SITE_OTHER): Payer: PRIVATE HEALTH INSURANCE | Admitting: Family Medicine

## 2018-05-09 DIAGNOSIS — M25551 Pain in right hip: Secondary | ICD-10-CM | POA: Diagnosis not present

## 2018-05-09 MED ORDER — PREDNISONE 10 MG PO TABS: ORAL_TABLET | ORAL | 0 refills | Status: DC

## 2018-05-09 MED ORDER — METHOCARBAMOL 500 MG PO TABS: 500.0000 mg | ORAL_TABLET | Freq: Four times a day (QID) | ORAL | 1 refills | Status: DC

## 2018-05-09 NOTE — Progress Notes (Signed)
Virtual Visit via Video Note  I connected with Jackie Sturm on 05/09/18 at  8:15 AM EDT by a video enabled telemedicine application and verified that I am speaking with the correct person using two identifiers.   I discussed the limitations of evaluation and management by telemedicine and the availability of in person appointments. The patient expressed understanding and agreed to proceed.  History of Present Illness: Pt is at work and c/o r leg pain from groin down leg to the knee in the front   A therapist at work gave her some stretches to do     She thinks she hurt it at the gym about 1 month ago.   Observations/Objective: Unable to get vs due to virtual visit  Pt is in NAD rr normal    Assessment and Plan: 1. Pain of right hip joint Ice/ heat , reset  xrays ordered Call next week if no better  - DG Lumbar Spine Complete; Future - DG Hip Unilat W OR W/O Pelvis 2-3 Views Right; Future - methocarbamol (ROBAXIN) 500 MG tablet; Take 1 tablet (500 mg total) by mouth 4 (four) times daily.  Dispense: 45 tablet; Refill: 1 - predniSONE (DELTASONE) 10 MG tablet; TAKE 3 TABLETS PO QD FOR 3 DAYS THEN TAKE 2 TABLETS PO QD FOR 3 DAYS THEN TAKE 1 TABLET PO QD FOR 3 DAYS THEN TAKE 1/2 TAB PO QD FOR 3 DAYS  Dispense: 20 tablet; Refill: 0   Follow Up Instructions:    I discussed the assessment and treatment plan with the patient. The patient was provided an opportunity to ask questions and all were answered. The patient agreed with the plan and demonstrated an understanding of the instructions.   The patient was advised to call back or seek an in-person evaluation if the symptoms worsen or if the condition fails to improve as anticipated.    Donato Schultz, DO

## 2018-05-09 NOTE — Telephone Encounter (Signed)
Virtual Visit scheduled °

## 2018-05-15 ENCOUNTER — Encounter: Payer: Self-pay | Admitting: Family Medicine

## 2018-05-15 DIAGNOSIS — R109 Unspecified abdominal pain: Secondary | ICD-10-CM

## 2018-05-16 ENCOUNTER — Other Ambulatory Visit: Payer: PRIVATE HEALTH INSURANCE

## 2018-05-16 ENCOUNTER — Telehealth: Payer: Self-pay

## 2018-05-16 NOTE — Telephone Encounter (Signed)
Patient questioned has been answered thru mychart.

## 2018-05-16 NOTE — Telephone Encounter (Signed)
Dr Zola Button -- pt has virtual visit on 05/09/18. Please advise pt's concerns about abdominal pain?

## 2018-05-16 NOTE — Telephone Encounter (Signed)
We can check bmp, amylase , lipase For pain if its coming back-- we can refer to sport med or ortho

## 2018-05-16 NOTE — Telephone Encounter (Signed)
My chart message sent to pt.

## 2018-05-16 NOTE — Telephone Encounter (Signed)
See other phone message  

## 2018-05-16 NOTE — Telephone Encounter (Signed)
Copied from CRM [id number]. Topic: General - Other >> May 15, 2018  3:54 PM Reggie Pile, NT wrote: Reason for CRM: Patient called in stating she would like to discuss some concerns she has with Dr.Lowne-Chase or nurse whenever available. Patient is having symptoms of pancreatitis and would like to discuss further. [phone]

## 2018-06-09 ENCOUNTER — Telehealth: Payer: Self-pay | Admitting: *Deleted

## 2018-06-09 DIAGNOSIS — E039 Hypothyroidism, unspecified: Secondary | ICD-10-CM

## 2018-06-09 NOTE — Telephone Encounter (Signed)
[facility] is changing levothyroxine to euthyroxine are you ok with change?

## 2018-06-09 NOTE — Telephone Encounter (Signed)
Ok-- we should check thyroid panel in 2 months

## 2018-06-11 NOTE — Telephone Encounter (Signed)
Left message on machine that she will need to come in for lab only appointment.   Pharmacy notified.

## 2018-06-25 ENCOUNTER — Encounter: Payer: Self-pay | Admitting: Family Medicine

## 2018-07-02 ENCOUNTER — Other Ambulatory Visit: Payer: Self-pay | Admitting: *Deleted

## 2018-07-02 MED ORDER — AMLODIPINE BESYLATE 5 MG PO TABS: 5.0000 mg | ORAL_TABLET | Freq: Every day | ORAL | 1 refills | Status: DC

## 2018-09-10 ENCOUNTER — Other Ambulatory Visit: Payer: Self-pay

## 2018-09-10 ENCOUNTER — Telehealth: Payer: Self-pay | Admitting: Family Medicine

## 2018-09-10 NOTE — Telephone Encounter (Signed)
LM for pt to call regarding request for appt Kristin Hubbard)  Reason for visit: Request an Appointment  Comments: Med check, blood work, and pain left side

## 2018-09-11 ENCOUNTER — Other Ambulatory Visit: Payer: Self-pay | Admitting: Family Medicine

## 2018-09-18 ENCOUNTER — Ambulatory Visit: Payer: PRIVATE HEALTH INSURANCE | Admitting: Family Medicine

## 2018-09-18 ENCOUNTER — Other Ambulatory Visit: Payer: Self-pay

## 2018-09-18 ENCOUNTER — Encounter: Payer: Self-pay | Admitting: Family Medicine

## 2018-09-18 VITALS — BP 142/88 | HR 109 | Temp 97.5°F | Resp 18 | Ht 68.0 in | Wt 338.4 lb

## 2018-09-18 DIAGNOSIS — I10 Essential (primary) hypertension: Secondary | ICD-10-CM

## 2018-09-18 DIAGNOSIS — R7989 Other specified abnormal findings of blood chemistry: Secondary | ICD-10-CM

## 2018-09-18 DIAGNOSIS — E039 Hypothyroidism, unspecified: Secondary | ICD-10-CM

## 2018-09-18 DIAGNOSIS — R109 Unspecified abdominal pain: Secondary | ICD-10-CM

## 2018-09-18 DIAGNOSIS — E785 Hyperlipidemia, unspecified: Secondary | ICD-10-CM

## 2018-09-18 LAB — POC URINALSYSI DIPSTICK (AUTOMATED)
Blood, UA: NEGATIVE
Glucose, UA: POSITIVE — AB
Ketones, UA: NEGATIVE
Leukocytes, UA: NEGATIVE
Nitrite, UA: NEGATIVE
Protein, UA: POSITIVE — AB
Spec Grav, UA: 1.02 (ref 1.010–1.025)
Urobilinogen, UA: 0.2 E.U./dL
pH, UA: 6 (ref 5.0–8.0)

## 2018-09-18 LAB — LIPID PANEL
Cholesterol: 181 mg/dL (ref 0–200)
HDL: 40.3 mg/dL (ref >39.00)
LDL Cholesterol: 116 mg/dL — ABNORMAL HIGH (ref 0–99)
NonHDL: 140.87
Total CHOL/HDL Ratio: 4
Triglycerides: 126 mg/dL (ref 0.0–149.0)
VLDL: 25.2 mg/dL (ref 0.0–40.0)

## 2018-09-18 LAB — LIPASE: Lipase: 56 U/L (ref 11.0–59.0)

## 2018-09-18 LAB — COMPREHENSIVE METABOLIC PANEL
ALT: 31 U/L (ref 0–35)
AST: 20 U/L (ref 0–37)
Albumin: 4 g/dL (ref 3.5–5.2)
Alkaline Phosphatase: 81 U/L (ref 39–117)
BUN: 15 mg/dL (ref 6–23)
CO2: 26 mEq/L (ref 19–32)
Calcium: 9.2 mg/dL (ref 8.4–10.5)
Chloride: 105 mEq/L (ref 96–112)
Creatinine, Ser: 0.8 mg/dL (ref 0.40–1.20)
GFR: 73.5 mL/min (ref >60.00)
Glucose, Bld: 119 mg/dL — ABNORMAL HIGH (ref 70–99)
Potassium: 4.3 mEq/L (ref 3.5–5.1)
Sodium: 138 mEq/L (ref 135–145)
Total Bilirubin: 0.3 mg/dL (ref 0.2–1.2)
Total Protein: 6.8 g/dL (ref 6.0–8.3)

## 2018-09-18 LAB — AMYLASE: Amylase: 36 U/L (ref 27–131)

## 2018-09-18 NOTE — Assessment & Plan Note (Signed)
Check labs today.

## 2018-09-18 NOTE — Progress Notes (Signed)
Patient ID: Kristin Hubbard [place], female    DOB: Apr 21, 1960  Age: 58 y.o. MRN: [id number]    Subjective:  Subjective  HPI Kristin Hubbard presents for L flank pain that comes and goes   No fever, ?constipation   No NVD She is concerned about the pancreas again.     Review of Systems  Constitutional: Negative for appetite change, diaphoresis, fatigue and unexpected weight change.  Eyes: Negative for pain, redness and visual disturbance.  Respiratory: Negative for cough, chest tightness, shortness of breath and wheezing.   Cardiovascular: Negative for chest pain, palpitations and leg swelling.  Gastrointestinal: Positive for abdominal pain and constipation. Negative for abdominal distention, blood in stool, diarrhea, nausea, rectal pain and vomiting.  Endocrine: Negative for cold intolerance, heat intolerance, polydipsia, polyphagia and polyuria.  Genitourinary: Negative for difficulty urinating, dysuria and frequency.  Neurological: Negative for dizziness, light-headedness, numbness and headaches.    History Past Medical History:  Diagnosis Date  . Anxiety   . Bilateral primary osteoarthritis of knee 01/31/2016  . Complication of anesthesia    after 1 st surgery was combative,other surgeries have been fine  . Hypertension   . Hypothyroidism   . Migraine   . Thyroid disease    Hypothyroidism    She has a past surgical history that includes Cholecystectomy; Knee surgery (05/2007); Bunionectomy; and Total knee arthroplasty (Bilateral, 01/31/2016).   Her family history includes Alcohol abuse in her father and mother; Diabetes in her father and unknown relative.She reports that she has been smoking cigarettes. She has a 17.00 pack-year smoking history. She has never used smokeless tobacco. She reports that she does not drink alcohol or use drugs.  Current Outpatient Medications on File Prior to Visit  Medication Sig Dispense Refill  . EUTHYROX 150 MCG tablet TAKE 1 TABLET BY MOUTH ONCE DAILY BEFORE  BREAKFAST 90 tablet 1  . simvastatin (ZOCOR) 40 MG tablet Take 1 tablet (40 mg total) by mouth at bedtime. 90 tablet 1  . amLODipine (NORVASC) 5 MG tablet Take 1 tablet (5 mg total) by mouth daily. (Patient not taking: Reported on 09/18/2018) 90 tablet 1   No current facility-administered medications on file prior to visit.      Objective:  Objective  Physical Exam Vitals signs and nursing note reviewed.  Constitutional:      Appearance: She is well-developed.  HENT:     Head: Normocephalic and atraumatic.  Eyes:     Conjunctiva/sclera: Conjunctivae normal.  Neck:     Musculoskeletal: Normal range of motion and neck supple.     Thyroid: No thyromegaly.     Vascular: No carotid bruit or JVD.  Cardiovascular:     Rate and Rhythm: Normal rate and regular rhythm.     Heart sounds: Normal heart sounds. No murmur.  Pulmonary:     Effort: Pulmonary effort is normal. No respiratory distress.     Breath sounds: Normal breath sounds. No wheezing or rales.  Chest:     Chest wall: No tenderness.  Abdominal:     General: There is no distension.     Palpations: Abdomen is soft. There is no mass.     Tenderness: There is no abdominal tenderness. There is no right CVA tenderness, left CVA tenderness, guarding or rebound.     Hernia: No hernia is present.  Neurological:     Mental Status: She is alert and oriented to person, place, and time.    BP (!) 142/88 (BP Location: Right Arm, Patient Position: Sitting,  Cuff Size: Large)   Pulse (!) 109   Temp (!) 97.5 F (36.4 C) (Temporal)   Resp 18   Ht 5\' 8"  (1.727 m)   Wt (!) 338 lb 6.4 oz (153.5 kg)   SpO2 95%   BMI 51.45 kg/m  Wt Readings from Last 3 Encounters:  09/18/18 (!) 338 lb 6.4 oz (153.5 kg)  02/10/18 (!) 321 lb 12.8 oz (146 kg)  09/13/17 (!) 320 lb 6.4 oz (145.3 kg)     Lab Results  Component Value Date   WBC 6.2 02/10/2018   HGB 14.8 02/10/2018   HCT 43.4 02/10/2018   PLT 176.0 02/10/2018   GLUCOSE 107 (H) 02/12/2018    CHOL 135 02/10/2018   TRIG 118.0 02/10/2018   HDL 34.80 (L) 02/10/2018   LDLDIRECT 173.6 08/22/2011   LDLCALC 76 02/10/2018   ALT 26 02/12/2018   AST 20 02/12/2018   NA 138 02/12/2018   K 4.2 02/12/2018   CL 103 02/12/2018   CREATININE 0.68 02/12/2018   BUN 11 02/12/2018   CO2 27 02/12/2018   TSH 0.46 02/12/2018   HGBA1C 6.0 02/10/2018    No results found.   Assessment & Plan:  Plan  I have discontinued Kristin Hubbard's Insulin Pen Needle, Liraglutide -Weight Management, nicotine, methocarbamol, and predniSONE. I am also having her maintain her simvastatin, amLODipine, and Euthyrox.  No orders of the defined types were placed in this encounter.   Problem List Items Addressed This Visit      Unprioritized   Essential hypertension    Well controlled, no changes to meds. Encouraged heart healthy diet such as the DASH diet and exercise as tolerated.       Relevant Orders   Comprehensive metabolic panel   Hyperlipidemia    Tolerating statin, encouraged heart healthy diet, avoid trans fats, minimize simple carbs and saturated fats. Increase exercise as tolerated      Relevant Orders   Lipid panel   Comprehensive metabolic panel   Hypothyroidism    Check labs today      Left flank pain - Primary    Check labs  No pain at the moment ua neg       Relevant Orders   Lipid panel   Comprehensive metabolic panel   Amylase   Lipase   POCT Urinalysis Dipstick (Automated) (Completed)   Urine Culture    Other Visit Diagnoses    Abnormal thyroid blood test       Relevant Orders   Thyroid Panel With TSH      Follow-up: Return in about 6 months (around 03/21/2019), or if symptoms worsen or fail to improve, for fasting, annual exam.  Ann Held, DO

## 2018-09-18 NOTE — Assessment & Plan Note (Signed)
Tolerating statin, encouraged heart healthy diet, avoid trans fats, minimize simple carbs and saturated fats. Increase exercise as tolerated 

## 2018-09-18 NOTE — Patient Instructions (Signed)
Flank Pain, Adult Flank pain is pain in your side. The flank is the area of your side between your upper belly (abdomen) and your back. The pain may occur over a short time (acute), or it may be long-term or come back often (chronic). It may be mild or very bad. Pain in this area can be caused by many different things. Follow these instructions at home:   Drink enough fluid to keep your pee (urine) clear or pale yellow.  Rest as told by your doctor.  Take over-the-counter and prescription medicines only as told by your doctor.  Keep a journal to keep track of: ? What has caused your flank pain. ? What has made it feel better.  Keep all follow-up visits as told by your doctor. This is important. Contact a doctor if:  Medicine does not help your pain.  You have new symptoms.  Your pain gets worse.  You have a fever.  Your symptoms last longer than 2-3 days.  You have trouble peeing.  You are peeing more often than normal. Get help right away if:  You have trouble breathing.  You are short of breath.  Your belly hurts, or it is swollen or red.  You feel sick to your stomach (nauseous).  You throw up (vomit).  You feel like you will pass out, or you do pass out (faint).  You have blood in your pee. Summary  Flank pain is pain in your side. The flank is the area of your side between your upper belly (abdomen) and your back.  Flank pain may occur over a short time (acute), or it may be long-term or come back often (chronic). It may be mild or very bad.  Pain in this area can be caused by many different things.  Contact your doctor if your symptoms get worse or they last longer than 2-3 days. This information is not intended to replace advice given to you by your health care provider. Make sure you discuss any questions you have with your health care provider. Document Released: 10/18/2007 Document Revised: 12/21/2016 Document Reviewed: 04/30/2016 Elsevier Patient  Education  2020 [employer] Inc[employer]  

## 2018-09-18 NOTE — Assessment & Plan Note (Signed)
Check labs  No pain at the moment ua neg

## 2018-09-18 NOTE — Assessment & Plan Note (Signed)
Well controlled, no changes to meds. Encouraged heart healthy diet such as the DASH diet and exercise as tolerated.  °

## 2018-09-19 LAB — THYROID PANEL WITH TSH
Free Thyroxine Index: 3.5 (ref 1.4–3.8)
T3 Uptake: 32 % (ref 22–35)
T4, Total: 10.9 ug/dL (ref 5.1–11.9)
TSH: 2.55 mIU/L (ref 0.40–4.50)

## 2018-09-20 LAB — URINE CULTURE
MICRO NUMBER:: 818705
Result:: NO GROWTH
SPECIMEN QUALITY:: ADEQUATE

## 2018-11-14 ENCOUNTER — Encounter: Payer: Self-pay | Admitting: Family Medicine

## 2018-11-14 DIAGNOSIS — E785 Hyperlipidemia, unspecified: Secondary | ICD-10-CM

## 2018-11-14 DIAGNOSIS — I10 Essential (primary) hypertension: Secondary | ICD-10-CM

## 2018-11-17 MED ORDER — SIMVASTATIN 40 MG PO TABS: 40.0000 mg | ORAL_TABLET | Freq: Every day | ORAL | 1 refills | Status: DC

## 2018-12-17 ENCOUNTER — Telehealth: Payer: Self-pay | Admitting: Family Medicine

## 2018-12-17 NOTE — Telephone Encounter (Signed)
Please advise. Pt is refusing OV

## 2018-12-17 NOTE — Telephone Encounter (Signed)
Medication Refill - Medication: Prednosone  Has the patient contacted their pharmacy? No - new complaint of right shoulder pain and right arm pain, states she was given prednisone for issue in leg and doesn't want OV, just wants medication. (Agent: If no, request that the patient contact the pharmacy for the refill.) (Agent: If yes, when and what did the pharmacy advise?)  Preferred Pharmacy (with phone number or street name):  [facility] [place], [place] [phone] (Phone) [phone] (Fax)   Agent: Please be advised that RX refills may take up to 3 business days. We ask that you follow-up with your pharmacy.

## 2018-12-21 NOTE — Telephone Encounter (Signed)
Needs ov

## 2018-12-22 ENCOUNTER — Encounter: Payer: Self-pay | Admitting: Family Medicine

## 2018-12-22 NOTE — Telephone Encounter (Signed)
Left detailed message that she needs an ov and to call back to schedule.

## 2018-12-23 NOTE — Telephone Encounter (Signed)
I really can not just call in prednisone for a pat when I have not seen them and the problem is different

## 2019-01-05 ENCOUNTER — Encounter: Payer: Self-pay | Admitting: Family Medicine

## 2019-01-05 MED ORDER — AMLODIPINE BESYLATE 5 MG PO TABS: 5.0000 mg | ORAL_TABLET | Freq: Every day | ORAL | 1 refills | Status: DC

## 2019-02-13 ENCOUNTER — Other Ambulatory Visit: Payer: Self-pay

## 2019-02-16 ENCOUNTER — Other Ambulatory Visit: Payer: Self-pay | Admitting: Family Medicine

## 2019-02-16 ENCOUNTER — Encounter: Payer: Self-pay | Admitting: Family Medicine

## 2019-02-16 ENCOUNTER — Ambulatory Visit (INDEPENDENT_AMBULATORY_CARE_PROVIDER_SITE_OTHER): Payer: PRIVATE HEALTH INSURANCE | Admitting: Family Medicine

## 2019-02-16 ENCOUNTER — Other Ambulatory Visit: Payer: Self-pay

## 2019-02-16 VITALS — BP 132/84 | HR 100 | Temp 97.7°F | Resp 18 | Ht 68.0 in | Wt 339.8 lb

## 2019-02-16 DIAGNOSIS — E119 Type 2 diabetes mellitus without complications: Secondary | ICD-10-CM

## 2019-02-16 DIAGNOSIS — Z1211 Encounter for screening for malignant neoplasm of colon: Secondary | ICD-10-CM

## 2019-02-16 DIAGNOSIS — R739 Hyperglycemia, unspecified: Secondary | ICD-10-CM | POA: Diagnosis not present

## 2019-02-16 DIAGNOSIS — Z0000 Encounter for general adult medical examination without abnormal findings: Secondary | ICD-10-CM | POA: Diagnosis not present

## 2019-02-16 DIAGNOSIS — R1012 Left upper quadrant pain: Secondary | ICD-10-CM

## 2019-02-16 DIAGNOSIS — E1169 Type 2 diabetes mellitus with other specified complication: Secondary | ICD-10-CM

## 2019-02-16 DIAGNOSIS — R109 Unspecified abdominal pain: Secondary | ICD-10-CM

## 2019-02-16 DIAGNOSIS — R110 Nausea: Secondary | ICD-10-CM

## 2019-02-16 DIAGNOSIS — G8929 Other chronic pain: Secondary | ICD-10-CM

## 2019-02-16 DIAGNOSIS — M25511 Pain in right shoulder: Secondary | ICD-10-CM | POA: Diagnosis not present

## 2019-02-16 LAB — LIPID PANEL
Cholesterol: 173 mg/dL (ref 0–200)
HDL: 39.8 mg/dL (ref >39.00)
LDL Cholesterol: 112 mg/dL — ABNORMAL HIGH (ref 0–99)
NonHDL: 133.63
Total CHOL/HDL Ratio: 4
Triglycerides: 106 mg/dL (ref 0.0–149.0)
VLDL: 21.2 mg/dL (ref 0.0–40.0)

## 2019-02-16 LAB — COMPREHENSIVE METABOLIC PANEL
ALT: 33 U/L (ref 0–35)
AST: 20 U/L (ref 0–37)
Albumin: 4.2 g/dL (ref 3.5–5.2)
Alkaline Phosphatase: 96 U/L (ref 39–117)
BUN: 13 mg/dL (ref 6–23)
CO2: 27 mEq/L (ref 19–32)
Calcium: 9.4 mg/dL (ref 8.4–10.5)
Chloride: 103 mEq/L (ref 96–112)
Creatinine, Ser: 0.7 mg/dL (ref 0.40–1.20)
GFR: 85.63 mL/min (ref >60.00)
Glucose, Bld: 127 mg/dL — ABNORMAL HIGH (ref 70–99)
Potassium: 4 mEq/L (ref 3.5–5.1)
Sodium: 139 mEq/L (ref 135–145)
Total Bilirubin: 0.3 mg/dL (ref 0.2–1.2)
Total Protein: 7 g/dL (ref 6.0–8.3)

## 2019-02-16 LAB — CBC WITH DIFFERENTIAL/PLATELET
Basophils Absolute: 0.1 10*3/uL (ref 0.0–0.1)
Basophils Relative: 1.3 % (ref 0.0–3.0)
Eosinophils Absolute: 0.1 10*3/uL (ref 0.0–0.7)
Eosinophils Relative: 2.2 % (ref 0.0–5.0)
HCT: 45 % (ref 36.0–46.0)
Hemoglobin: 15.2 g/dL — ABNORMAL HIGH (ref 12.0–15.0)
Lymphocytes Relative: 31.1 % (ref 12.0–46.0)
Lymphs Abs: 2.1 10*3/uL (ref 0.7–4.0)
MCHC: 33.7 g/dL (ref 30.0–36.0)
MCV: 97.3 fl (ref 78.0–100.0)
Monocytes Absolute: 0.5 10*3/uL (ref 0.1–1.0)
Monocytes Relative: 6.9 % (ref 3.0–12.0)
Neutro Abs: 4 10*3/uL (ref 1.4–7.7)
Neutrophils Relative %: 58.5 % (ref 43.0–77.0)
Platelets: 193 10*3/uL (ref 150.0–400.0)
RBC: 4.62 Mil/uL (ref 3.87–5.11)
RDW: 13.4 % (ref 11.5–15.5)
WBC: 6.9 10*3/uL (ref 4.0–10.5)

## 2019-02-16 LAB — POC URINALSYSI DIPSTICK (AUTOMATED)
Bilirubin, UA: NEGATIVE
Blood, UA: NEGATIVE
Glucose, UA: NEGATIVE
Ketones, UA: NEGATIVE
Leukocytes, UA: NEGATIVE
Nitrite, UA: NEGATIVE
Protein, UA: NEGATIVE
Spec Grav, UA: 1.02 (ref 1.010–1.025)
Urobilinogen, UA: 0.2 E.U./dL
pH, UA: 6 (ref 5.0–8.0)

## 2019-02-16 LAB — HEMOGLOBIN A1C: Hgb A1c MFr Bld: 6.7 % — ABNORMAL HIGH (ref 4.6–6.5)

## 2019-02-16 LAB — AMYLASE: Amylase: 29 U/L (ref 27–131)

## 2019-02-16 LAB — TSH: TSH: 2.13 u[IU]/mL (ref 0.35–4.50)

## 2019-02-16 MED ORDER — PREDNISONE 10 MG PO TABS: ORAL_TABLET | ORAL | 0 refills | Status: DC

## 2019-02-16 NOTE — Progress Notes (Signed)
Subjective:     Kristin Hubbard is a 59 y.o. female and is here for a comprehensive physical exam. The patient reports problems - L upper quadrant pain that feels the same as when she had the pancreatitis --- it comes and goes --she has been under a lot of stress -- her mom died just after Christmas ,   she has been nauseous . She also c/o R shoulder pain-- no known injury ----   Social History   Socioeconomic History  . Marital status: Single    Spouse name: Not on file  . Number of children: Not on file  . Years of education: Not on file  . Highest education level: Not on file  Occupational History  . Occupation: Database administrator: [employer]  Tobacco Use  . Smoking status: Current Every Day Smoker    Packs/day: 1.00    Years: 17.00    Pack years: 17.00    Types: Cigarettes  . Smokeless tobacco: Never Used  . Tobacco comment: she said when she loses 100 lbs she will quit smoking  Substance and Sexual Activity  . Alcohol use: No  . Drug use: No  . Sexual activity: Not Currently    Partners: Male  Other Topics Concern  . Not on file  Social History Narrative   Exercise--  Psychologist, educational and gym   Social Determinants of Health   Financial Resource Strain:   . Difficulty of Paying Living Expenses: Not on file  Food Insecurity:   . Worried About Programme researcher, broadcasting/film/video in the Last Year: Not on file  . Ran Out of Food in the Last Year: Not on file  Transportation Needs:   . Lack of Transportation (Medical): Not on file  . Lack of Transportation (Non-Medical): Not on file  Physical Activity:   . Days of Exercise per Week: Not on file  . Minutes of Exercise per Session: Not on file  Stress:   . Feeling of Stress : Not on file  Social Connections:   . Frequency of Communication with Friends and Family: Not on file  . Frequency of Social Gatherings with Friends and Family: Not on file  . Attends Religious Services: Not on file  . Active Member of Clubs or  Organizations: Not on file  . Attends Banker Meetings: Not on file  . Marital Status: Not on file  Intimate Partner Violence:   . Fear of Current or Ex-Partner: Not on file  . Emotionally Abused: Not on file  . Physically Abused: Not on file  . Sexually Abused: Not on file   Health Maintenance  Topic Date Due  . FOOT EXAM  01/19/1970  . OPHTHALMOLOGY EXAM  01/19/1970  . URINE MICROALBUMIN  01/19/1970  . HIV Screening  01/20/1975  . MAMMOGRAM  06/25/2010  . PAP SMEAR-Modifier  06/06/2017  . HEMOGLOBIN A1C  08/11/2018  . INFLUENZA VACCINE  04/22/2019 (Originally 08/23/2018)  . PNEUMOCOCCAL POLYSACCHARIDE VACCINE AGE 62-64 HIGH RISK  02/16/2020 (Originally 01/19/1962)  . COLONOSCOPY  04/15/2022  . TETANUS/TDAP  04/15/2022  . Hepatitis C Screening  Completed    The following portions of the patient's history were reviewed and updated as appropriate:  She  has a past medical history of Anxiety, Bilateral primary osteoarthritis of knee (01/31/2016), Complication of anesthesia, Hypertension, Hypothyroidism, Migraine, and Thyroid disease. She does not have any pertinent problems on file. She  has a past surgical history that includes Cholecystectomy; Knee surgery (  05/2007); Bunionectomy; and Total knee arthroplasty (Bilateral, 01/31/2016). Her family history includes Alcohol abuse in her father and mother; Diabetes in her father and another family member; Heart attack in her father; Kidney disease in her mother. She  reports that she has been smoking cigarettes. She has a 17.00 pack-year smoking history. She has never used smokeless tobacco. She reports that she does not drink alcohol or use drugs. She has a current medication list which includes the following prescription(s): amlodipine, euthyrox, simvastatin, and prednisone. Current Outpatient Medications on File Prior to Visit  Medication Sig Dispense Refill  . amLODipine (NORVASC) 5 MG tablet Take 1 tablet (5 mg total) by mouth  daily. 90 tablet 1  . EUTHYROX 150 MCG tablet TAKE 1 TABLET BY MOUTH ONCE DAILY BEFORE BREAKFAST 90 tablet 1  . simvastatin (ZOCOR) 40 MG tablet Take 1 tablet (40 mg total) by mouth at bedtime. 90 tablet 1   No current facility-administered medications on file prior to visit.   She is allergic to lisinopril and vicodin [hydrocodone-acetaminophen]..  Review of Systems Review of Systems  Constitutional: Negative for activity change, appetite change and fatigue.  HENT: Negative for hearing loss, congestion, tinnitus and ear discharge.  dentist q68m Eyes: Negative for visual disturbance (see optho q1y -- vision corrected to 20/20 with glasses).  Respiratory: Negative for cough, chest tightness and shortness of breath.   Cardiovascular: Negative for chest pain, palpitations and leg swelling.  Gastrointestinal: Negative for abdominal pain, diarrhea, constipation and abdominal distention.  Genitourinary: Negative for urgency, frequency, decreased urine volume and difficulty urinating.  Musculoskeletal: Negative for back pain, and gait problem.  R shoulder pain  Skin: Negative for color change, pallor and rash.  Neurological: Negative for dizziness, light-headedness, numbness and headaches.  Hematological: Negative for adenopathy. Does not bruise/bleed easily.  Psychiatric/Behavioral: Negative for suicidal ideas, confusion, sleep disturbance, self-injury, dysphoric mood, decreased concentration and agitation.       Objective:    BP 132/84 (BP Location: Right Arm, Patient Position: Sitting, Cuff Size: Large)   Pulse 100   Temp 97.7 F (36.5 C) (Temporal)   Resp 18   Ht 5\' 8"  (1.727 m)   Wt (!) 339 lb 12.8 oz (154.1 kg)   SpO2 97%   BMI 51.67 kg/m  General appearance: alert, cooperative, appears stated age and no distress Head: Normocephalic, without obvious abnormality, atraumatic Eyes: negative findings: lids and lashes normal, conjunctivae and sclerae normal and pupils equal, round,  reactive to light and accomodation Ears: normal TM's and external ear canals both ears Neck: no adenopathy, no carotid bruit, no JVD, supple, symmetrical, trachea midline and thyroid not enlarged, symmetric, no tenderness/mass/nodules Back: symmetric, no curvature. ROM normal. No CVA tenderness. Lungs: clear to auscultation bilaterally Breasts: normal appearance, no masses or tenderness Heart: regular rate and rhythm, S1, S2 normal, no murmur, click, rub or gallop Abdomen: soft, non-tender; bowel sounds normal; no masses,  no organomegaly Pelvic: exam obscured by obesity, external genitalia normal and vagina normal without discharge Extremities: extremities normal, atraumatic, no cyanosis or edema Pulses: 2+ and symmetric Skin: Skin color, texture, turgor normal. No rashes or lesions Lymph nodes: Cervical, supraclavicular, and axillary nodes normal. Neurologic: Alert and oriented X 3, normal strength and tone. Normal symmetric reflexes. Normal coordination and gait   MS exam--  R shoulder --- full rom but pain past 90degree abduction  Assessment:    Healthy female exam.      Plan:    ghm utd--- pt refusing all vaccines  Form filled out for foster children  Check labs  See After Visit Summary for Counseling Recommendations    1. Preventative health care See above  - Lipid panel - Comprehensive metabolic panel - CBC with Differential/Platelet - TSH  2. Left upper quadrant abdominal pain con't with nausea  Check labs  F/u gi  - Amylase - POCT Urinalysis Dipstick (Automated)  3. Colon cancer screening Refer to GI  4. Hyperglycemia Check labs  - Hemoglobin A1c  5. 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 Nausea Check labs   - Ambulatory referral to Gastroenterology  7. Left sided abdominal pain Chronic Pt did not want to have ct scan  Refer to GI - Ambulatory referral to Gastroenterology

## 2019-02-16 NOTE — Patient Instructions (Signed)

## 2019-03-02 ENCOUNTER — Encounter: Payer: Self-pay | Admitting: Family Medicine

## 2019-03-03 NOTE — Telephone Encounter (Signed)
I have faxed the referral to Dr. Kinnie Scales.

## 2019-03-18 ENCOUNTER — Encounter: Payer: Self-pay | Admitting: Family Medicine

## 2019-03-19 MED ORDER — LEVOTHYROXINE SODIUM 150 MCG PO TABS: ORAL_TABLET | ORAL | 1 refills | Status: DC

## 2019-04-20 LAB — HM COLONOSCOPY

## 2019-04-21 ENCOUNTER — Telehealth: Payer: Self-pay

## 2019-04-21 ENCOUNTER — Other Ambulatory Visit: Payer: Self-pay

## 2019-04-21 ENCOUNTER — Encounter: Payer: Self-pay | Admitting: Family Medicine

## 2019-04-21 ENCOUNTER — Ambulatory Visit: Payer: PRIVATE HEALTH INSURANCE | Admitting: Family Medicine

## 2019-04-21 VITALS — BP 138/80 | HR 99 | Temp 98.6°F | Resp 18 | Ht 68.0 in | Wt 341.0 lb

## 2019-04-21 DIAGNOSIS — M25511 Pain in right shoulder: Secondary | ICD-10-CM | POA: Diagnosis not present

## 2019-04-21 DIAGNOSIS — G8929 Other chronic pain: Secondary | ICD-10-CM | POA: Diagnosis not present

## 2019-04-21 DIAGNOSIS — M25552 Pain in left hip: Secondary | ICD-10-CM | POA: Diagnosis not present

## 2019-04-21 MED ORDER — SERTRALINE HCL 50 MG PO TABS: ORAL_TABLET | ORAL | 1 refills | Status: DC

## 2019-04-21 MED ORDER — PREDNISONE 20 MG PO TABS: 40.0000 mg | ORAL_TABLET | Freq: Every day | ORAL | 0 refills | Status: DC

## 2019-04-21 NOTE — Telephone Encounter (Signed)
We can try zoloft 50 mg 1/2 po qd x 1 week then 1 po qd ------- f/u 1 month #30  pills 1 refill

## 2019-04-21 NOTE — Progress Notes (Signed)
Patient ID: Kristin Hubbard, female    DOB: Oct 09, 1960  Age: 59 y.o. MRN: [id number]    Subjective:  Subjective  HPI Kristin Hubbard presents for c/o pain L thigh.  She is able to walk and lift leg up when standing but if sitting or laying down she can not lift it up    Review of Systems  Constitutional: Negative for appetite change, diaphoresis, fatigue and unexpected weight change.  Eyes: Negative for pain, redness and visual disturbance.  Respiratory: Negative for cough, chest tightness, shortness of breath and wheezing.   Cardiovascular: Negative for chest pain, palpitations and leg swelling.  Endocrine: Negative for cold intolerance, heat intolerance, polydipsia, polyphagia and polyuria.  Genitourinary: Negative for difficulty urinating, dysuria and frequency.  Neurological: Negative for dizziness, light-headedness, numbness and headaches.    History Past Medical History:  Diagnosis Date  . Anxiety   . Bilateral primary osteoarthritis of knee 01/31/2016  . Complication of anesthesia    after 1 st surgery was combative,other surgeries have been fine  . Hypertension   . Hypothyroidism   . Migraine   . Thyroid disease    Hypothyroidism    She has a past surgical history that includes Cholecystectomy; Knee surgery (05/2007); Bunionectomy; and Total knee arthroplasty (Bilateral, 01/31/2016).   Her family history includes Alcohol abuse in her father and mother; Diabetes in her father and another family member; Heart attack in her father; Kidney disease in her mother.She reports that she has been smoking cigarettes. She has a 17.00 pack-year smoking history. She has never used smokeless tobacco. She reports that she does not drink alcohol or use drugs.  Current Outpatient Medications on File Prior to Visit  Medication Sig Dispense Refill  . amLODipine (NORVASC) 5 MG tablet Take 1 tablet (5 mg total) by mouth daily. 90 tablet 1  . levothyroxine (EUTHYROX) 150 MCG tablet TAKE 1 TABLET BY  MOUTH ONCE DAILY BEFORE BREAKFAST 90 tablet 1  . simvastatin (ZOCOR) 40 MG tablet Take 1 tablet (40 mg total) by mouth at bedtime. 90 tablet 1   No current facility-administered medications on file prior to visit.     Objective:  Objective  Physical Exam Vitals and nursing note reviewed.  Constitutional:      Appearance: She is well-developed.  HENT:     Head: Normocephalic and atraumatic.  Eyes:     Conjunctiva/sclera: Conjunctivae normal.  Neck:     Thyroid: No thyromegaly.     Vascular: No carotid bruit or JVD.  Cardiovascular:     Rate and Rhythm: Normal rate and regular rhythm.     Heart sounds: Normal heart sounds. No murmur.  Pulmonary:     Effort: Pulmonary effort is normal. No respiratory distress.     Breath sounds: Normal breath sounds. No wheezing or rales.  Chest:     Chest wall: No tenderness.  Musculoskeletal:        General: Tenderness present.     Right shoulder: Tenderness present. Decreased range of motion.       Arms:     Cervical back: Normal range of motion and neck supple.     Right upper leg: Normal.     Left upper leg: Tenderness present.       Legs:  Neurological:     Mental Status: She is alert and oriented to person, place, and time.    BP 138/80 (BP Location: Right Arm, Patient Position: Sitting, Cuff Size: Large)   Pulse 99   Temp 98.6 F (  37 C) (Temporal)   Resp 18   Ht 5\' 8"  (1.727 m)   Wt (!) 341 lb (154.7 kg)   SpO2 97%   BMI 51.85 kg/m  Wt Readings from Last 3 Encounters:  04/21/19 (!) 341 lb (154.7 kg)  02/16/19 (!) 339 lb 12.8 oz (154.1 kg)  09/18/18 (!) 338 lb 6.4 oz (153.5 kg)     Lab Results  Component Value Date   WBC 6.9 02/16/2019   HGB 15.2 (H) 02/16/2019   HCT 45.0 02/16/2019   PLT 193.0 02/16/2019   GLUCOSE 127 (H) 02/16/2019   CHOL 173 02/16/2019   TRIG 106.0 02/16/2019   HDL 39.80 02/16/2019   LDLDIRECT 173.6 08/22/2011   LDLCALC 112 (H) 02/16/2019   ALT 33 02/16/2019   AST 20 02/16/2019   NA 139  02/16/2019   K 4.0 02/16/2019   CL 103 02/16/2019   CREATININE 0.70 02/16/2019   BUN 13 02/16/2019   CO2 27 02/16/2019   TSH 2.13 02/16/2019   HGBA1C 6.7 (H) 02/16/2019    No results found.   Assessment & Plan:  Plan  I have discontinued Kristin Hubbard's predniSONE. I am also having her start on predniSONE. Additionally, I am having her maintain her simvastatin, amLODipine, and levothyroxine.  Meds ordered this encounter  Medications  . predniSONE (DELTASONE) 20 MG tablet    Sig: Take 2 tablets (40 mg total) by mouth daily.    Dispense:  10 tablet    Refill:  0    Problem List Items Addressed This Visit      Unprioritized   Chronic right shoulder pain    Refer to sport med if no improvement with pred taper       Relevant Medications   predniSONE (DELTASONE) 20 MG tablet   Left hip pain - Primary    L thigh and hip pain Will try pred taper--- short taper Refer to ortho or sport med       Relevant Medications   predniSONE (DELTASONE) 20 MG tablet      Follow-up: Return in about 6 months (around 10/22/2019), or if symptoms worsen or fail to improve, for hypertension, hyperlipidemia.  10/24/2019, DO

## 2019-04-21 NOTE — Assessment & Plan Note (Signed)
Refer to sport med if no improvement with pred taper

## 2019-04-21 NOTE — Telephone Encounter (Signed)
New med sent in. Pt made aware.

## 2019-04-21 NOTE — Assessment & Plan Note (Signed)
L thigh and hip pain Will try pred taper--- short taper Refer to ortho or sport med

## 2019-04-21 NOTE — Patient Instructions (Signed)
Hip Pain The hip is the joint between the upper legs and the lower pelvis. The bones, cartilage, tendons, and muscles of your hip joint support your body and allow you to move around. Hip pain can range from a minor ache to severe pain in one or both of your hips. The pain may be felt on the inside of the hip joint near the groin, or on the outside near the buttocks and upper thigh. You may also have swelling or stiffness in your hip area. Follow these instructions at home: Managing pain, stiffness, and swelling      If directed, put ice on the painful area. To do this: ? Put ice in a plastic bag. ? Place a towel between your skin and the bag. ? Leave the ice on for 20 minutes, 2-3 times a day.  If directed, apply heat to the affected area as often as told by your health care provider. Use the heat source that your health care provider recommends, such as a moist heat pack or a heating pad. ? Place a towel between your skin and the heat source. ? Leave the heat on for 20-30 minutes. ? Remove the heat if your skin turns bright red. This is especially important if you are unable to feel pain, heat, or cold. You may have a greater risk of getting burned. Activity  Do exercises as told by your health care provider.  Avoid activities that cause pain. General instructions   Take over-the-counter and prescription medicines only as told by your health care provider.  Keep a journal of your symptoms. Write down: ? How often you have hip pain. ? The location of your pain. ? What the pain feels like. ? What makes the pain worse.  Sleep with a pillow between your legs on your most comfortable side.  Keep all follow-up visits as told by your health care provider. This is important. Contact a health care provider if:  You cannot put weight on your leg.  Your pain or swelling continues or gets worse after one week.  It gets harder to walk.  You have a fever. Get help right away  if:  You fall.  You have a sudden increase in pain and swelling in your hip.  Your hip is red or swollen or very tender to touch. Summary  Hip pain can range from a minor ache to severe pain in one or both of your hips.  The pain may be felt on the inside of the hip joint near the groin, or on the outside near the buttocks and upper thigh.  Avoid activities that cause pain.  Write down how often you have hip pain, the location of the pain, what makes it worse, and what it feels like. This information is not intended to replace advice given to you by your health care provider. Make sure you discuss any questions you have with your health care provider. Document Revised: 05/26/2018 Document Reviewed: 05/26/2018 Elsevier Patient Education  2020 Elsevier Inc[employer] -- 

## 2019-04-21 NOTE — Telephone Encounter (Signed)
Before patient left. She states she would like something for depression. Pt states she wanted to talk to you about this at the most recent visit. Pt states she has had two family members pass recently. Please advise

## 2019-04-22 NOTE — Telephone Encounter (Signed)
Pt was notified about the Zoloft being sent in.

## 2019-05-27 ENCOUNTER — Other Ambulatory Visit: Payer: Self-pay | Admitting: Family Medicine

## 2019-05-27 DIAGNOSIS — E785 Hyperlipidemia, unspecified: Secondary | ICD-10-CM

## 2019-05-27 DIAGNOSIS — I10 Essential (primary) hypertension: Secondary | ICD-10-CM

## 2019-06-01 ENCOUNTER — Encounter: Payer: Self-pay | Admitting: Family Medicine

## 2019-06-01 ENCOUNTER — Other Ambulatory Visit: Payer: Self-pay

## 2019-06-01 ENCOUNTER — Ambulatory Visit: Payer: PRIVATE HEALTH INSURANCE | Admitting: Family Medicine

## 2019-06-01 VITALS — BP 134/90 | HR 111 | Temp 97.9°F | Resp 18 | Ht 68.0 in | Wt 334.6 lb

## 2019-06-01 DIAGNOSIS — F321 Major depressive disorder, single episode, moderate: Secondary | ICD-10-CM | POA: Diagnosis not present

## 2019-06-01 DIAGNOSIS — R350 Frequency of micturition: Secondary | ICD-10-CM | POA: Diagnosis not present

## 2019-06-01 LAB — POC URINALSYSI DIPSTICK (AUTOMATED)
Bilirubin, UA: NEGATIVE
Glucose, UA: NEGATIVE
Ketones, UA: NEGATIVE
Nitrite, UA: NEGATIVE
Protein, UA: POSITIVE — AB
Spec Grav, UA: 1.025
Urobilinogen, UA: 0.2 U/dL
pH, UA: 6

## 2019-06-01 MED ORDER — CEPHALEXIN 500 MG PO CAPS: 500.0000 mg | ORAL_CAPSULE | Freq: Two times a day (BID) | ORAL | 0 refills | Status: DC

## 2019-06-01 MED ORDER — SERTRALINE HCL 100 MG PO TABS: 100.0000 mg | ORAL_TABLET | Freq: Every day | ORAL | 3 refills | Status: DC

## 2019-06-01 NOTE — Progress Notes (Signed)
Patient ID: Kristin Hubbard, female    DOB: 05/17/1960  Age: 60 y.o. MRN: [id number]    Subjective:  Subjective  HPI Kristin Hubbard presents for c/o urinary frequency.  No blood, no dysuria.    No back pain   Review of Systems  Constitutional: Negative for appetite change, diaphoresis, fatigue and unexpected weight change.  Eyes: Negative for pain, redness and visual disturbance.  Respiratory: Negative for cough, chest tightness, shortness of breath and wheezing.   Cardiovascular: Negative for chest pain, palpitations and leg swelling.  Endocrine: Negative for cold intolerance, heat intolerance, polydipsia, polyphagia and polyuria.  Genitourinary: Positive for frequency. Negative for difficulty urinating, dysuria and hematuria.  Neurological: Negative for dizziness, light-headedness, numbness and headaches.    History Past Medical History:  Diagnosis Date  . Anxiety   . Bilateral primary osteoarthritis of knee 01/31/2016  . Complication of anesthesia    after 1 st surgery was combative,other surgeries have been fine  . Hypertension   . Hypothyroidism   . Migraine   . Thyroid disease    Hypothyroidism    She has a past surgical history that includes Cholecystectomy; Knee surgery (05/2007); Bunionectomy; and Total knee arthroplasty (Bilateral, 01/31/2016).   Her family history includes Alcohol abuse in her father and mother; Diabetes in her father and another family member; Heart attack in her father; Kidney disease in her mother.She reports that she has been smoking cigarettes. She has a 17.00 pack-year smoking history. She has never used smokeless tobacco. She reports that she does not drink alcohol or use drugs.  Current Outpatient Medications on File Prior to Visit  Medication Sig Dispense Refill  . amLODipine (NORVASC) 5 MG tablet Take 1 tablet (5 mg total) by mouth daily. 90 tablet 1  . levothyroxine (EUTHYROX) 150 MCG tablet TAKE 1 TABLET BY MOUTH ONCE DAILY BEFORE BREAKFAST 90  tablet 1  . simvastatin (ZOCOR) 40 MG tablet TAKE 1 TABLET BY MOUTH AT BEDTIME 90 tablet 0   No current facility-administered medications on file prior to visit.     Objective:  Objective  Physical Exam Vitals and nursing note reviewed.  Constitutional:      Appearance: She is well-developed.  HENT:     Head: Normocephalic and atraumatic.  Eyes:     Conjunctiva/sclera: Conjunctivae normal.  Neck:     Thyroid: No thyromegaly.     Vascular: No carotid bruit or JVD.  Cardiovascular:     Rate and Rhythm: Normal rate and regular rhythm.     Heart sounds: Normal heart sounds. No murmur.  Pulmonary:     Effort: Pulmonary effort is normal. No respiratory distress.     Breath sounds: Normal breath sounds. No wheezing or rales.  Chest:     Chest wall: No tenderness.  Musculoskeletal:     Cervical back: Normal range of motion and neck supple.  Neurological:     Mental Status: She is alert and oriented to person, place, and time.    BP 134/90 (BP Location: Right Arm, Patient Position: Sitting, Cuff Size: Large)   Pulse (!) 111   Temp 97.9 F (36.6 C) (Temporal)   Resp 18   Ht 5\' 8"  (1.727 m)   Wt (!) 334 lb 9.6 oz (151.8 kg)   SpO2 95%   BMI 50.88 kg/m  Wt Readings from Last 3 Encounters:  06/01/19 (!) 334 lb 9.6 oz (151.8 kg)  04/21/19 (!) 341 lb (154.7 kg)  02/16/19 (!) 339 lb 12.8 oz (154.1 kg)  Lab Results  Component Value Date   WBC 6.9 02/16/2019   HGB 15.2 (H) 02/16/2019   HCT 45.0 02/16/2019   PLT 193.0 02/16/2019   GLUCOSE 127 (H) 02/16/2019   CHOL 173 02/16/2019   TRIG 106.0 02/16/2019   HDL 39.80 02/16/2019   LDLDIRECT 173.6 08/22/2011   LDLCALC 112 (H) 02/16/2019   ALT 33 02/16/2019   AST 20 02/16/2019   NA 139 02/16/2019   K 4.0 02/16/2019   CL 103 02/16/2019   CREATININE 0.70 02/16/2019   BUN 13 02/16/2019   CO2 27 02/16/2019   TSH 2.13 02/16/2019   HGBA1C 6.7 (H) 02/16/2019    No results found.   Assessment & Plan:  Plan  I have  discontinued Kristin Hubbard's predniSONE and sertraline. I am also having her start on cephALEXin and sertraline. Additionally, I am having her maintain her amLODipine, levothyroxine, and simvastatin.  Meds ordered this encounter  Medications  . cephALEXin (KEFLEX) 500 MG capsule    Sig: Take 1 capsule (500 mg total) by mouth 2 (two) times daily.    Dispense:  20 capsule    Refill:  0  . sertraline (ZOLOFT) 100 MG tablet    Sig: Take 1 tablet (100 mg total) by mouth daily.    Dispense:  30 tablet    Refill:  3    Problem List Items Addressed This Visit    None    Visit Diagnoses    Urine frequency    -  Primary   Relevant Medications   cephALEXin (KEFLEX) 500 MG capsule   Other Relevant Orders   POCT Urinalysis Dipstick (Automated) (Completed)   Urine Culture   Depression, major, single episode, moderate (HCC)       Relevant Medications   sertraline (ZOLOFT) 100 MG tablet      Follow-up: Return if symptoms worsen or fail to improve.  Ann Held, DO

## 2019-06-01 NOTE — Patient Instructions (Signed)
Urinary Tract Infection, Adult A urinary tract infection (UTI) is an infection of any part of the urinary tract. The urinary tract includes:  The kidneys.  The ureters.  The bladder.  The urethra. These organs make, store, and get rid of pee (urine) in the body. What are the causes? This is caused by germs (bacteria) in your genital area. These germs grow and cause swelling (inflammation) of your urinary tract. What increases the risk? You are more likely to develop this condition if:  You have a small, thin tube (catheter) to drain pee.  You cannot control when you pee or poop (incontinence).  You are female, and: ? You use these methods to prevent pregnancy:  A medicine that kills sperm (spermicide).  A device that blocks sperm (diaphragm). ? You have low levels of a female hormone (estrogen). ? You are pregnant.  You have genes that add to your risk.  You are sexually active.  You take antibiotic medicines.  You have trouble peeing because of: ? A prostate that is bigger than normal, if you are female. ? A blockage in the part of your body that drains pee from the bladder (urethra). ? A kidney stone. ? A nerve condition that affects your bladder (neurogenic bladder). ? Not getting enough to drink. ? Not peeing often enough.  You have other conditions, such as: ? Diabetes. ? A weak disease-fighting system (immune system). ? Sickle cell disease. ? Gout. ? Injury of the spine. What are the signs or symptoms? Symptoms of this condition include:  Needing to pee right away (urgently).  Peeing often.  Peeing small amounts often.  Pain or burning when peeing.  Blood in the pee.  Pee that smells bad or not like normal.  Trouble peeing.  Pee that is cloudy.  Fluid coming from the vagina, if you are female.  Pain in the belly or lower back. Other symptoms include:  Throwing up (vomiting).  No urge to eat.  Feeling mixed up (confused).  Being tired  and grouchy (irritable).  A fever.  Watery poop (diarrhea). How is this treated? This condition may be treated with:  Antibiotic medicine.  Other medicines.  Drinking enough water. Follow these instructions at home:  Medicines  Take over-the-counter and prescription medicines only as told by your doctor.  If you were prescribed an antibiotic medicine, take it as told by your doctor. Do not stop taking it even if you start to feel better. General instructions  Make sure you: ? Pee until your bladder is empty. ? Do not hold pee for a long time. ? Empty your bladder after sex. ? Wipe from front to back after pooping if you are a female. Use each tissue one time when you wipe.  Drink enough fluid to keep your pee pale yellow.  Keep all follow-up visits as told by your doctor. This is important. Contact a doctor if:  You do not get better after 1-2 days.  Your symptoms go away and then come back. Get help right away if:  You have very bad back pain.  You have very bad pain in your lower belly.  You have a fever.  You are sick to your stomach (nauseous).  You are throwing up. Summary  A urinary tract infection (UTI) is an infection of any part of the urinary tract.  This condition is caused by germs in your genital area.  There are many risk factors for a UTI. These include having a small, thin   tube to drain pee and not being able to control when you pee or poop.  Treatment includes antibiotic medicines for germs.  Drink enough fluid to keep your pee pale yellow. This information is not intended to replace advice given to you by your health care provider. Make sure you discuss any questions you have with your health care provider. Document Revised: 12/26/2017 Document Reviewed: 07/18/2017 Elsevier Patient Education  2020 Elsevier Inc.  

## 2019-06-01 NOTE — Telephone Encounter (Signed)
Attempted to reach pt by phone to schedule OV today and left detailed message to call and schedule appointment to assess symptoms and do testing.

## 2019-06-03 LAB — URINE CULTURE
MICRO NUMBER:: 10458569
SPECIMEN QUALITY:: ADEQUATE

## 2019-07-08 ENCOUNTER — Other Ambulatory Visit: Payer: Self-pay | Admitting: Family Medicine

## 2019-07-08 DIAGNOSIS — R350 Frequency of micturition: Secondary | ICD-10-CM

## 2019-07-13 ENCOUNTER — Other Ambulatory Visit: Payer: Self-pay | Admitting: Family Medicine

## 2019-08-05 ENCOUNTER — Other Ambulatory Visit: Payer: Self-pay | Admitting: Family Medicine

## 2019-08-05 ENCOUNTER — Encounter: Payer: Self-pay | Admitting: Family Medicine

## 2019-08-05 DIAGNOSIS — M79603 Pain in arm, unspecified: Secondary | ICD-10-CM

## 2019-08-05 MED ORDER — PREDNISONE 10 MG PO TABS: ORAL_TABLET | ORAL | 0 refills | Status: DC

## 2019-08-05 NOTE — Telephone Encounter (Signed)
pred taper sent in and referral placed

## 2019-08-05 NOTE — Telephone Encounter (Signed)
Please advise 

## 2019-08-26 ENCOUNTER — Other Ambulatory Visit: Payer: Self-pay | Admitting: Family Medicine

## 2019-08-26 DIAGNOSIS — I10 Essential (primary) hypertension: Secondary | ICD-10-CM

## 2019-08-26 DIAGNOSIS — E785 Hyperlipidemia, unspecified: Secondary | ICD-10-CM

## 2019-09-15 ENCOUNTER — Other Ambulatory Visit: Payer: Self-pay | Admitting: Family Medicine

## 2019-09-23 ENCOUNTER — Ambulatory Visit (INDEPENDENT_AMBULATORY_CARE_PROVIDER_SITE_OTHER): Payer: PRIVATE HEALTH INSURANCE | Admitting: Family Medicine

## 2019-09-23 ENCOUNTER — Other Ambulatory Visit: Payer: Self-pay

## 2019-09-23 ENCOUNTER — Encounter: Payer: Self-pay | Admitting: Family Medicine

## 2019-09-23 VITALS — HR 80 | Ht 68.0 in

## 2019-09-23 DIAGNOSIS — J208 Acute bronchitis due to other specified organisms: Secondary | ICD-10-CM | POA: Diagnosis not present

## 2019-09-23 MED ORDER — AZITHROMYCIN 250 MG PO TABS: ORAL_TABLET | ORAL | 0 refills | Status: DC

## 2019-09-23 MED ORDER — BENZONATATE 100 MG PO CAPS: 100.0000 mg | ORAL_CAPSULE | Freq: Two times a day (BID) | ORAL | 0 refills | Status: DC | PRN

## 2019-09-23 MED ORDER — PREDNISONE 10 MG PO TABS: ORAL_TABLET | ORAL | 0 refills | Status: DC

## 2019-09-23 NOTE — Progress Notes (Addendum)
Virtual Visit via Video Note  I connected with Kristin Hubbard on 09/23/19 at 11:40 AM EDT by a video enabled telemedicine application and verified that I am speaking with the correct person using two identifiers.  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 The patient expressed understanding and agreed to proceed.--- had to transition to phone call due to pt having connectivity issues   History of Present Illness: Pt is at work with cough and congestion -----   x 2 weeks    No fever  She is taking mucinex with some help  She has also tried delsym with some help    Observations/Objective: Vitals:   09/23/19 1142  Pulse: 80   Pulse ox 97%   Pt is in nad Not coughing on phone Assessment and Plan:  1. Viral bronchitis Pt will get covid test if she does not improve over weekend pred taper and tessalon perles Will take z pak if symptoms do not improve in next few days  - benzonatate (TESSALON) 100 MG capsule; Take 1 capsule (100 mg total) by mouth 2 (two) times daily as needed for cough.  Dispense: 20 capsule; Refill: 0 - azithromycin (ZITHROMAX Z-PAK) 250 MG tablet; As directed  Dispense: 6 each; Refill: 0   Follow Up Instructions:    I discussed the assessment and treatment plan with the patient. The patient was provided an opportunity to ask questions and all were answered. The patient agreed with the plan and demonstrated an understanding of the instructions.   The patient was advised to call back or seek an in-person evaluation if the symptoms worsen or if the condition fails to improve as anticipated.  I provided 25 minutes of non-face-to-face time during this encounter.   Donato Schultz, DO

## 2019-10-06 ENCOUNTER — Telehealth: Payer: Self-pay | Admitting: Family Medicine

## 2019-10-06 NOTE — Telephone Encounter (Signed)
Caller Kristin Hubbard Call Back # [phone]  Patient states she had a virtual visit with PCP on 09/23/2019 .Patient states that she is itching and still having a cough.    Please Advise

## 2019-10-06 NOTE — Telephone Encounter (Signed)
We did nt discuss itching but did she have covid test?   We may put her on whoever is doing sick visits tomorrow here

## 2019-10-06 NOTE — Telephone Encounter (Signed)
Please advise 

## 2019-10-07 ENCOUNTER — Ambulatory Visit (INDEPENDENT_AMBULATORY_CARE_PROVIDER_SITE_OTHER)
Admission: RE | Admit: 2019-10-07 | Discharge: 2019-10-07 | Disposition: A | Discharge status: Home / Self Care | Payer: PRIVATE HEALTH INSURANCE | Source: Ambulatory Visit | Attending: Family Medicine | Admitting: Family Medicine

## 2019-10-07 ENCOUNTER — Other Ambulatory Visit: Payer: Self-pay | Admitting: Family Medicine

## 2019-10-07 ENCOUNTER — Encounter: Payer: Self-pay | Admitting: Family Medicine

## 2019-10-07 ENCOUNTER — Telehealth (INDEPENDENT_AMBULATORY_CARE_PROVIDER_SITE_OTHER): Payer: PRIVATE HEALTH INSURANCE | Admitting: Family Medicine

## 2019-10-07 ENCOUNTER — Other Ambulatory Visit: Payer: Self-pay

## 2019-10-07 VITALS — HR 84 | Ht 68.0 in

## 2019-10-07 DIAGNOSIS — R059 Cough, unspecified: Secondary | ICD-10-CM

## 2019-10-07 DIAGNOSIS — R05 Cough: Secondary | ICD-10-CM

## 2019-10-07 MED ORDER — BENZONATATE 200 MG PO CAPS: 200.0000 mg | ORAL_CAPSULE | Freq: Two times a day (BID) | ORAL | 0 refills | Status: DC | PRN

## 2019-10-07 MED ORDER — QVAR REDIHALER 40 MCG/ACT IN AERB: 2.0000 | INHALATION_SPRAY | Freq: Two times a day (BID) | RESPIRATORY_TRACT | 2 refills | Status: DC

## 2019-10-07 NOTE — Progress Notes (Signed)
Virtual Visit via Video Note  I connected with [employer] on 10/07/19 at  8:40 AM EDT by a video enabled telemedicine application and verified that I am speaking with the correct person using two identifiers.  Location: Patient: at work in her office alone  Provider: home    I discussed the limitations of evaluation and management by telemedicine and the availability of in person appointments. The patient expressed understanding and agreed to proceed.  History of Present Illness: Pt is at work still c/o cough that is productive -----thick mucus  No fever + wheeze  Coughs only during the day --  ? Allergies in office  She has not been tested for covid but says she feels fine --- the cough is just annoying     Observations/Objective: Vitals:   10/07/19 0840  Pulse: 84  pt is in nad    Assessment and Plan: 1. Cough ? Post infectious   - beclomethasone (QVAR REDIHALER) 40 MCG/ACT inhaler; Inhale 2 puffs into the lungs 2 (two) times daily.  Dispense: 1 each; Refill: 2 - DG Chest 2 View; Future - benzonatate (TESSALON) 200 MG capsule; Take 1 capsule (200 mg total) by mouth 2 (two) times daily as needed for cough.  Dispense: 20 capsule; Refill: 0   Follow Up Instructions:    I discussed the assessment and treatment plan with the patient. The patient was provided an opportunity to ask questions and all were answered. The patient agreed with the plan and demonstrated an understanding of the instructions.   The patient was advised to call back or seek an in-person evaluation if the symptoms worsen or if the condition fails to improve as anticipated.  I provided 25 minutes of non-face-to-face time during this encounter.   Donato Schultz, DO

## 2019-10-07 NOTE — Telephone Encounter (Signed)
Pt had virtual today 

## 2019-10-08 ENCOUNTER — Encounter: Payer: Self-pay | Admitting: Family Medicine

## 2019-10-09 ENCOUNTER — Telehealth: Payer: Self-pay | Admitting: Family Medicine

## 2019-10-09 NOTE — Telephone Encounter (Signed)
  Patient states her insurance company is not covering QVAR. She is not sure if a prior authorization is need it. She has a horrible cough and really needs an inhaler.

## 2019-10-12 ENCOUNTER — Encounter: Payer: Self-pay | Admitting: Family Medicine

## 2019-10-12 ENCOUNTER — Telehealth: Payer: Self-pay

## 2019-10-12 MED ORDER — FLOVENT HFA 110 MCG/ACT IN AERO: 2.0000 | INHALATION_SPRAY | Freq: Two times a day (BID) | RESPIRATORY_TRACT | 3 refills | Status: DC

## 2019-10-12 NOTE — Telephone Encounter (Signed)
Patient called in reference to messages sent to CMA . Patient is requesting a call back from clinical supervisor. Patient states she has requested to be seen in person due to her symptoms. Patient states she needs to be seen in person. Patient is also requesting medication refill   Please Advise

## 2019-10-12 NOTE — Telephone Encounter (Signed)
Qvar Redihaler not longer covered by [employer]. Preferred alternatives: Arnuity Ellipta, Flovent Diskus, Flovent HFA.

## 2019-10-12 NOTE — Telephone Encounter (Signed)
Pt really needs to be tested in order to bring her in the building--- she can be seen outside at end of day for sick visit    if she has been tested she can come in with doc of test

## 2019-10-12 NOTE — Telephone Encounter (Signed)
Spoke with patient and [facility] sent message regarding inhaler. Pt states she would like to be seen in person for her cough so her lungs could be listened too. Pt states her ribs are hurting from coughing so much and would like something called in for her cough if she can not be seen in person. Please advise

## 2019-10-12 NOTE — Telephone Encounter (Signed)
Rx sent 

## 2019-10-12 NOTE — Telephone Encounter (Signed)
Flovent hfa  110  2 puffs bid #1  3 refills

## 2019-10-12 NOTE — Telephone Encounter (Signed)
Patient states she still coughing really bad. The cough medicine prescribe is not really helping she was wondering if there were something else. She is also waiting on inhaler.

## 2019-10-14 NOTE — Telephone Encounter (Signed)
Left message to return call 

## 2019-10-22 ENCOUNTER — Other Ambulatory Visit: Payer: Self-pay | Admitting: Family Medicine

## 2019-10-22 DIAGNOSIS — R059 Cough, unspecified: Secondary | ICD-10-CM

## 2019-12-07 ENCOUNTER — Other Ambulatory Visit: Payer: Self-pay | Admitting: Family Medicine

## 2019-12-07 DIAGNOSIS — E785 Hyperlipidemia, unspecified: Secondary | ICD-10-CM

## 2019-12-07 DIAGNOSIS — I10 Essential (primary) hypertension: Secondary | ICD-10-CM

## 2019-12-08 MED ORDER — SIMVASTATIN 40 MG PO TABS: 40.0000 mg | ORAL_TABLET | Freq: Every day | ORAL | 0 refills | Status: DC

## 2019-12-08 MED ORDER — LEVOTHYROXINE SODIUM 150 MCG PO TABS: 150.0000 ug | ORAL_TABLET | Freq: Every day | ORAL | 0 refills | Status: DC

## 2020-01-07 ENCOUNTER — Other Ambulatory Visit: Payer: Self-pay | Admitting: Family Medicine

## 2020-03-09 ENCOUNTER — Other Ambulatory Visit: Payer: Self-pay | Admitting: Family Medicine

## 2020-03-09 DIAGNOSIS — I10 Essential (primary) hypertension: Secondary | ICD-10-CM

## 2020-03-09 DIAGNOSIS — E785 Hyperlipidemia, unspecified: Secondary | ICD-10-CM

## 2020-03-24 ENCOUNTER — Encounter: Payer: Self-pay | Admitting: Family Medicine

## 2020-03-25 MED ORDER — AMLODIPINE BESYLATE 5 MG PO TABS
5.0000 mg | ORAL_TABLET | Freq: Every day | ORAL | 0 refills | Status: DC
Start: 2020-03-25 — End: 2020-03-28

## 2020-03-25 MED ORDER — LEVOTHYROXINE SODIUM 150 MCG PO TABS: 150.0000 ug | ORAL_TABLET | Freq: Every day | ORAL | 0 refills | Status: DC

## 2020-03-28 MED ORDER — AMLODIPINE BESYLATE 5 MG PO TABS: 5.0000 mg | ORAL_TABLET | Freq: Every day | ORAL | 0 refills | Status: DC

## 2020-03-28 MED ORDER — LEVOTHYROXINE SODIUM 150 MCG PO TABS: 150.0000 ug | ORAL_TABLET | Freq: Every day | ORAL | 0 refills | Status: DC

## 2020-04-11 ENCOUNTER — Ambulatory Visit: Payer: PRIVATE HEALTH INSURANCE | Admitting: Family Medicine

## 2020-04-11 ENCOUNTER — Encounter: Payer: Self-pay | Admitting: Family Medicine

## 2020-04-11 ENCOUNTER — Other Ambulatory Visit: Payer: Self-pay

## 2020-04-11 VITALS — BP 122/74 | HR 100 | Temp 98.8°F | Resp 18 | Ht 68.0 in | Wt 336.2 lb

## 2020-04-11 DIAGNOSIS — E785 Hyperlipidemia, unspecified: Secondary | ICD-10-CM

## 2020-04-11 DIAGNOSIS — I10 Essential (primary) hypertension: Secondary | ICD-10-CM | POA: Diagnosis not present

## 2020-04-11 DIAGNOSIS — E039 Hypothyroidism, unspecified: Secondary | ICD-10-CM

## 2020-04-11 DIAGNOSIS — L659 Nonscarring hair loss, unspecified: Secondary | ICD-10-CM | POA: Insufficient documentation

## 2020-04-11 LAB — COMPREHENSIVE METABOLIC PANEL
ALT: 38 U/L — ABNORMAL HIGH (ref 0–35)
AST: 31 U/L (ref 0–37)
Albumin: 4.4 g/dL (ref 3.5–5.2)
Alkaline Phosphatase: 93 U/L (ref 39–117)
BUN: 12 mg/dL (ref 6–23)
CO2: 26 mEq/L (ref 19–32)
Calcium: 9.4 mg/dL (ref 8.4–10.5)
Chloride: 101 mEq/L (ref 96–112)
Creatinine, Ser: 0.78 mg/dL (ref 0.40–1.20)
GFR: 82.67 mL/min (ref >60.00)
Glucose, Bld: 222 mg/dL — ABNORMAL HIGH (ref 70–99)
Potassium: 4.4 mEq/L (ref 3.5–5.1)
Sodium: 137 mEq/L (ref 135–145)
Total Bilirubin: 0.6 mg/dL (ref 0.2–1.2)
Total Protein: 7.2 g/dL (ref 6.0–8.3)

## 2020-04-11 LAB — LIPID PANEL
Cholesterol: 199 mg/dL (ref 0–200)
HDL: 39.5 mg/dL (ref >39.00)
LDL Cholesterol: 131 mg/dL — ABNORMAL HIGH (ref 0–99)
NonHDL: 159.06
Total CHOL/HDL Ratio: 5
Triglycerides: 142 mg/dL (ref 0.0–149.0)
VLDL: 28.4 mg/dL (ref 0.0–40.0)

## 2020-04-11 NOTE — Assessment & Plan Note (Signed)
Check labs  Take biotin -- hair and nails

## 2020-04-11 NOTE — Patient Instructions (Signed)

## 2020-04-11 NOTE — Assessment & Plan Note (Signed)
Well controlled, no changes to meds. Encouraged heart healthy diet such as the DASH diet and exercise as tolerated.  °

## 2020-04-11 NOTE — Assessment & Plan Note (Signed)
Pt will start exercise at the gym soon

## 2020-04-11 NOTE — Assessment & Plan Note (Signed)
Check labs con't synthroid 

## 2020-04-11 NOTE — Assessment & Plan Note (Signed)
Tolerating statin, encouraged heart healthy diet, avoid trans fats, minimize simple carbs and saturated fats. Increase exercise as tolerated 

## 2020-04-11 NOTE — Progress Notes (Signed)
Patient ID: Kristin Hubbard, female    DOB: Nov 20, 1960  Age: 60 y.o. MRN: [id number]    Subjective:  Subjective  HPI Kristin Hubbard presents for an office visit today to f/u bp and cholesterol  She also c/o hair loss and would like to discuss the possibility of one of her meds causing it.   She denies any chest pain, SOB, fever, abdominal pain, cough, chills, sore throat, dysuria, urinary incontinence, back pain, HA, or N/VD at this time.    Review of Systems  Constitutional: Negative for chills, fatigue and fever.  HENT: Negative for congestion, sinus pain and sore throat.   Eyes: Negative for pain.  Respiratory: Negative for cough and shortness of breath.   Cardiovascular: Negative for chest pain, palpitations and leg swelling.  Gastrointestinal: Negative for abdominal pain, blood in stool, constipation, diarrhea, nausea and vomiting.  Genitourinary: Negative for dysuria, frequency, hematuria and urgency.  Musculoskeletal: Negative for back pain and myalgias.  Skin: Negative for rash.  Neurological: Negative for headaches.    History Past Medical History:  Diagnosis Date  . Anxiety   . Bilateral primary osteoarthritis of knee 01/31/2016  . Complication of anesthesia    after 1 st surgery was combative,other surgeries have been fine  . Hypertension   . Hypothyroidism   . Migraine   . Thyroid disease    Hypothyroidism    She has a past surgical history that includes Cholecystectomy; Knee surgery (05/2007); Bunionectomy; and Total knee arthroplasty (Bilateral, 01/31/2016).   Her family history includes Alcohol abuse in her father and mother; Diabetes in her father and another family member; Heart attack in her father; Kidney disease in her mother.She reports that she has been smoking cigarettes. She has a 17.00 pack-year smoking history. She has never used smokeless tobacco. She reports that she does not drink alcohol and does not use drugs.  Current Outpatient Medications on File Prior  to Visit  Medication Sig Dispense Refill  . amLODipine (NORVASC) 5 MG tablet Take 1 tablet (5 mg total) by mouth daily. 90 tablet 0  . benzonatate (TESSALON) 100 MG capsule Take 1 capsule (100 mg total) by mouth 2 (two) times daily as needed for cough. 20 capsule 0  . benzonatate (TESSALON) 200 MG capsule TAKE 1 CAPSULE BY MOUTH TWICE DAILY AS NEEDED FOR COUGH 20 capsule 0  . fluticasone (FLOVENT HFA) 110 MCG/ACT inhaler Inhale 2 puffs into the lungs in the morning and at bedtime. 12 g 3  . levothyroxine (EUTHYROX) 150 MCG tablet Take 1 tablet (150 mcg total) by mouth daily before breakfast. 90 tablet 0  . simvastatin (ZOCOR) 40 MG tablet TAKE 1 TABLET BY MOUTH AT BEDTIME 90 tablet 0   No current facility-administered medications on file prior to visit.     Objective:  Objective  Physical Exam Constitutional:      General: She is not in acute distress.    Appearance: Normal appearance. She is well-developed. She is not ill-appearing.  HENT:     Head: Normocephalic and atraumatic.     Right Ear: External ear normal.     Left Ear: External ear normal.     Nose: Nose normal.  Eyes:     Extraocular Movements: Extraocular movements intact.     Pupils: Pupils are equal, round, and reactive to light.  Cardiovascular:     Rate and Rhythm: Normal rate and regular rhythm.     Pulses: Normal pulses.     Heart sounds: Normal heart sounds. No murmur  heard.   Pulmonary:     Effort: Pulmonary effort is normal. No respiratory distress.     Breath sounds: Normal breath sounds. No wheezing or rales.  Chest:     Chest wall: No tenderness.  Abdominal:     Palpations: Abdomen is soft.     Tenderness: There is no abdominal tenderness. There is no guarding.     Hernia: No hernia is present.  Musculoskeletal:     Cervical back: Normal range of motion and neck supple.  Neurological:     Mental Status: She is alert and oriented to person, place, and time.  Psychiatric:        Behavior: Behavior  normal.    BP 122/74 (BP Location: Left Arm, Patient Position: Sitting, Cuff Size: Large)   Pulse 100   Temp 98.8 F (37.1 C) (Oral)   Resp 18   Ht 5\' 8"  (1.727 m)   Wt (!) 336 lb 3.2 oz (152.5 kg)   SpO2 98%   BMI 51.12 kg/m  Wt Readings from Last 3 Encounters:  04/11/20 (!) 336 lb 3.2 oz (152.5 kg)  06/01/19 (!) 334 lb 9.6 oz (151.8 kg)  04/21/19 (!) 341 lb (154.7 kg)     Lab Results  Component Value Date   WBC 6.9 02/16/2019   HGB 15.2 (H) 02/16/2019   HCT 45.0 02/16/2019   PLT 193.0 02/16/2019   GLUCOSE 127 (H) 02/16/2019   CHOL 173 02/16/2019   TRIG 106.0 02/16/2019   HDL 39.80 02/16/2019   LDLDIRECT 173.6 08/22/2011   LDLCALC 112 (H) 02/16/2019   ALT 33 02/16/2019   AST 20 02/16/2019   NA 139 02/16/2019   K 4.0 02/16/2019   CL 103 02/16/2019   CREATININE 0.70 02/16/2019   BUN 13 02/16/2019   CO2 27 02/16/2019   TSH 2.13 02/16/2019   HGBA1C 6.7 (H) 02/16/2019    DG Chest 2 View  Result Date: 10/07/2019 CLINICAL DATA:  Cough 2 weeks. EXAM: CHEST - 2 VIEW COMPARISON:  Chest x-ray 05/05/2009 FINDINGS: The heart size upper limits normal. Atherosclerotic calcifications are present at the aortic arch. Chronic interstitial coarsening is present. No superimposed airspace disease present. No edema or effusion is present. IMPRESSION: No acute cardiopulmonary disease or significant interval change. Electronically Signed   By: 05/07/2009 M.D.   On: 10/07/2019 12:36     Assessment & Plan:  Plan    No orders of the defined types were placed in this encounter.   Problem List Items Addressed This Visit      Unprioritized   Essential hypertension    Well controlled, no changes to meds. Encouraged heart healthy diet such as the DASH diet and exercise as tolerated.       Hair loss    Check labs  Take biotin -- hair and nails       Hyperlipidemia    Tolerating statin, encouraged heart healthy diet, avoid trans fats, minimize simple carbs and saturated  fats. Increase exercise as tolerated      Relevant Orders   Lipid panel   Comprehensive metabolic panel   Hypothyroidism - Primary    Check labs  con't synthroid       Relevant Orders   Thyroid Panel With TSH   Morbid obesity (HCC)    Pt will start exercise at the gym soon       Other Visit Diagnoses    Primary hypertension       Relevant Orders   Comprehensive metabolic  panel      Follow-up: Return in about 6 months (around 10/12/2020), or if symptoms worsen or fail to improve, for annual exam, fasting.   I,Gordon Zheng,acting as a Neurosurgeon for [employer], DO.,have documented all relevant documentation on the behalf of Donato Schultz, DO,as directed by  Donato Schultz, DO while in the presence of Donato Schultz, DO.  I, Donato Schultz, DO, have reviewed all documentation for this visit. The documentation on 04/11/20 for the exam, diagnosis, procedures, and orders are all accurate and complete.

## 2020-04-12 ENCOUNTER — Other Ambulatory Visit: Payer: Self-pay

## 2020-04-12 ENCOUNTER — Other Ambulatory Visit: Payer: Self-pay | Admitting: Family Medicine

## 2020-04-12 DIAGNOSIS — E1165 Type 2 diabetes mellitus with hyperglycemia: Secondary | ICD-10-CM

## 2020-04-12 DIAGNOSIS — E039 Hypothyroidism, unspecified: Secondary | ICD-10-CM

## 2020-04-12 DIAGNOSIS — I10 Essential (primary) hypertension: Secondary | ICD-10-CM

## 2020-04-12 LAB — THYROID PANEL WITH TSH
Free Thyroxine Index: 3.8 (ref 1.4–3.8)
T3 Uptake: 29 % (ref 22–35)
T4, Total: 13.1 ug/dL — ABNORMAL HIGH (ref 5.1–11.9)
TSH: 6.5 mIU/L — ABNORMAL HIGH (ref 0.40–4.50)

## 2020-04-12 MED ORDER — LEVOTHYROXINE SODIUM 175 MCG PO TABS: 175.0000 ug | ORAL_TABLET | Freq: Every day | ORAL | 1 refills | Status: DC

## 2020-04-15 ENCOUNTER — Encounter: Payer: Self-pay | Admitting: Family Medicine

## 2020-04-15 NOTE — Telephone Encounter (Signed)
Recommendations on labs did not mention Alt. Please advise

## 2020-04-15 NOTE — Telephone Encounter (Signed)
It is only 3 pts over---- nothing to worry about --- it could have been from otc med Gi specialists don't worry about it until it is 2-3 x normal

## 2020-05-17 ENCOUNTER — Encounter: Payer: Self-pay | Admitting: Family Medicine

## 2020-05-18 MED ORDER — AMLODIPINE BESYLATE 5 MG PO TABS: 5.0000 mg | ORAL_TABLET | Freq: Every day | ORAL | 0 refills | Status: DC

## 2020-06-07 ENCOUNTER — Encounter: Payer: Self-pay | Admitting: Family Medicine

## 2020-06-07 ENCOUNTER — Other Ambulatory Visit: Payer: Self-pay

## 2020-06-07 DIAGNOSIS — E039 Hypothyroidism, unspecified: Secondary | ICD-10-CM

## 2020-06-07 DIAGNOSIS — E785 Hyperlipidemia, unspecified: Secondary | ICD-10-CM

## 2020-06-07 DIAGNOSIS — I10 Essential (primary) hypertension: Secondary | ICD-10-CM

## 2020-06-07 MED ORDER — SIMVASTATIN 40 MG PO TABS: 40.0000 mg | ORAL_TABLET | Freq: Every day | ORAL | 0 refills | Status: DC

## 2020-06-07 MED ORDER — LEVOTHYROXINE SODIUM 175 MCG PO TABS: 175.0000 ug | ORAL_TABLET | Freq: Every day | ORAL | 0 refills | Status: DC

## 2020-07-21 ENCOUNTER — Encounter: Payer: Self-pay | Admitting: Family Medicine

## 2020-07-22 ENCOUNTER — Other Ambulatory Visit: Payer: Self-pay | Admitting: Family Medicine

## 2020-07-22 DIAGNOSIS — N760 Acute vaginitis: Secondary | ICD-10-CM

## 2020-07-22 MED ORDER — FLUCONAZOLE 150 MG PO TABS: ORAL_TABLET | ORAL | 0 refills | Status: DC

## 2020-08-25 ENCOUNTER — Other Ambulatory Visit: Payer: Self-pay | Admitting: Family Medicine

## 2020-08-25 DIAGNOSIS — I10 Essential (primary) hypertension: Secondary | ICD-10-CM

## 2020-08-25 DIAGNOSIS — E785 Hyperlipidemia, unspecified: Secondary | ICD-10-CM

## 2020-08-25 DIAGNOSIS — E039 Hypothyroidism, unspecified: Secondary | ICD-10-CM

## 2020-08-25 MED ORDER — AMLODIPINE BESYLATE 5 MG PO TABS: 5.0000 mg | ORAL_TABLET | Freq: Every day | ORAL | 0 refills | Status: DC

## 2020-08-25 MED ORDER — SIMVASTATIN 40 MG PO TABS: 40.0000 mg | ORAL_TABLET | Freq: Every day | ORAL | 0 refills | Status: DC

## 2020-08-25 MED ORDER — LEVOTHYROXINE SODIUM 175 MCG PO TABS: 175.0000 ug | ORAL_TABLET | Freq: Every day | ORAL | 0 refills | Status: DC

## 2020-11-24 ENCOUNTER — Encounter: Payer: Self-pay | Admitting: Family Medicine

## 2020-11-24 DIAGNOSIS — E785 Hyperlipidemia, unspecified: Secondary | ICD-10-CM

## 2020-11-24 DIAGNOSIS — I10 Essential (primary) hypertension: Secondary | ICD-10-CM

## 2020-11-24 DIAGNOSIS — E039 Hypothyroidism, unspecified: Secondary | ICD-10-CM

## 2020-11-25 MED ORDER — LEVOTHYROXINE SODIUM 175 MCG PO TABS: 175.0000 ug | ORAL_TABLET | Freq: Every day | ORAL | 0 refills | Status: DC

## 2020-11-25 MED ORDER — AMLODIPINE BESYLATE 5 MG PO TABS: 5.0000 mg | ORAL_TABLET | Freq: Every day | ORAL | 0 refills | Status: DC

## 2020-11-25 MED ORDER — SIMVASTATIN 40 MG PO TABS
40.0000 mg | ORAL_TABLET | Freq: Every day | ORAL | 0 refills | Status: DC
Start: 2020-11-25 — End: 2021-03-30

## 2020-12-02 ENCOUNTER — Telehealth: Payer: Self-pay | Admitting: Family Medicine

## 2020-12-02 NOTE — Telephone Encounter (Signed)
Pt. Called and stated that her pharmacy sent over a request to make sure it was okay to switch manufactures for the medication below. They stated they haven't heard anything back regarding the decision.   levothyroxine (SYNTHROID) 175 MCG tablet

## 2020-12-02 NOTE — Telephone Encounter (Signed)
Pharmacy called. Unable to talk to anyone but I left a message with confirmation

## 2021-01-10 ENCOUNTER — Telehealth: Payer: Self-pay | Admitting: Family Medicine

## 2021-01-10 NOTE — Telephone Encounter (Signed)
Error

## 2021-01-12 ENCOUNTER — Other Ambulatory Visit (INDEPENDENT_AMBULATORY_CARE_PROVIDER_SITE_OTHER): Payer: No Typology Code available for payment source

## 2021-01-12 ENCOUNTER — Other Ambulatory Visit: Payer: PRIVATE HEALTH INSURANCE

## 2021-01-12 DIAGNOSIS — E039 Hypothyroidism, unspecified: Secondary | ICD-10-CM

## 2021-01-12 DIAGNOSIS — I10 Essential (primary) hypertension: Secondary | ICD-10-CM

## 2021-01-12 DIAGNOSIS — E1165 Type 2 diabetes mellitus with hyperglycemia: Secondary | ICD-10-CM

## 2021-01-12 LAB — COMPREHENSIVE METABOLIC PANEL
ALT: 47 U/L — ABNORMAL HIGH (ref 0–35)
AST: 42 U/L — ABNORMAL HIGH (ref 0–37)
Albumin: 4.1 g/dL (ref 3.5–5.2)
Alkaline Phosphatase: 89 U/L (ref 39–117)
BUN: 17 mg/dL (ref 6–23)
CO2: 25 mEq/L (ref 19–32)
Calcium: 9.8 mg/dL (ref 8.4–10.5)
Chloride: 101 mEq/L (ref 96–112)
Creatinine, Ser: 0.84 mg/dL (ref 0.40–1.20)
GFR: 75.23 mL/min (ref >60.00)
Glucose, Bld: 206 mg/dL — ABNORMAL HIGH (ref 70–99)
Potassium: 4.2 mEq/L (ref 3.5–5.1)
Sodium: 136 mEq/L (ref 135–145)
Total Bilirubin: 0.5 mg/dL (ref 0.2–1.2)
Total Protein: 7.1 g/dL (ref 6.0–8.3)

## 2021-01-12 LAB — LIPID PANEL
Cholesterol: 187 mg/dL (ref 0–200)
HDL: 38.8 mg/dL — ABNORMAL LOW (ref >39.00)
LDL Cholesterol: 111 mg/dL — ABNORMAL HIGH (ref 0–99)
NonHDL: 147.86
Total CHOL/HDL Ratio: 5
Triglycerides: 183 mg/dL — ABNORMAL HIGH (ref 0.0–149.0)
VLDL: 36.6 mg/dL (ref 0.0–40.0)

## 2021-01-12 LAB — HEMOGLOBIN A1C: Hgb A1c MFr Bld: 9.6 % — ABNORMAL HIGH (ref 4.6–6.5)

## 2021-01-13 LAB — THYROID PANEL WITH TSH
Free Thyroxine Index: 3 (ref 1.4–3.8)
T3 Uptake: 28 % (ref 22–35)
T4, Total: 10.6 ug/dL (ref 5.1–11.9)
TSH: 5.44 mIU/L — ABNORMAL HIGH (ref 0.40–4.50)

## 2021-01-24 ENCOUNTER — Other Ambulatory Visit: Payer: Self-pay | Admitting: Family Medicine

## 2021-01-24 DIAGNOSIS — E039 Hypothyroidism, unspecified: Secondary | ICD-10-CM

## 2021-01-25 ENCOUNTER — Other Ambulatory Visit: Payer: Self-pay

## 2021-01-25 MED ORDER — LEVOTHYROXINE SODIUM 200 MCG PO TABS: 200.0000 ug | ORAL_TABLET | Freq: Every day | ORAL | 0 refills | Status: DC

## 2021-02-27 ENCOUNTER — Encounter: Payer: Self-pay | Admitting: Family Medicine

## 2021-02-28 MED ORDER — AMLODIPINE BESYLATE 5 MG PO TABS: 5.0000 mg | ORAL_TABLET | Freq: Every day | ORAL | 0 refills | Status: DC

## 2021-03-13 ENCOUNTER — Ambulatory Visit: Payer: PRIVATE HEALTH INSURANCE | Admitting: Family Medicine

## 2021-03-13 ENCOUNTER — Encounter: Payer: Self-pay | Admitting: Family Medicine

## 2021-03-13 ENCOUNTER — Other Ambulatory Visit (HOSPITAL_COMMUNITY)
Admission: RE | Admit: 2021-03-13 | Discharge: 2021-03-13 | Disposition: A | Discharge status: Home / Self Care | Payer: PRIVATE HEALTH INSURANCE | Source: Ambulatory Visit | Attending: Family Medicine | Admitting: Family Medicine

## 2021-03-13 VITALS — BP 158/85 | HR 113 | Resp 20 | Wt 336.0 lb

## 2021-03-13 DIAGNOSIS — E785 Hyperlipidemia, unspecified: Secondary | ICD-10-CM

## 2021-03-13 DIAGNOSIS — B379 Candidiasis, unspecified: Secondary | ICD-10-CM

## 2021-03-13 DIAGNOSIS — R739 Hyperglycemia, unspecified: Secondary | ICD-10-CM | POA: Diagnosis not present

## 2021-03-13 DIAGNOSIS — B3731 Acute candidiasis of vulva and vagina: Secondary | ICD-10-CM | POA: Diagnosis not present

## 2021-03-13 DIAGNOSIS — E1165 Type 2 diabetes mellitus with hyperglycemia: Secondary | ICD-10-CM

## 2021-03-13 DIAGNOSIS — E669 Obesity, unspecified: Secondary | ICD-10-CM

## 2021-03-13 DIAGNOSIS — E039 Hypothyroidism, unspecified: Secondary | ICD-10-CM | POA: Diagnosis not present

## 2021-03-13 DIAGNOSIS — I10 Essential (primary) hypertension: Secondary | ICD-10-CM

## 2021-03-13 LAB — THYROID PANEL WITH TSH
Free Thyroxine Index: 3.9 — ABNORMAL HIGH (ref 1.4–3.8)
T3 Uptake: 29 % (ref 22–35)
T4, Total: 13.4 ug/dL — ABNORMAL HIGH (ref 5.1–11.9)
TSH: 1.85 mIU/L (ref 0.40–4.50)

## 2021-03-13 LAB — POC URINALSYSI DIPSTICK (AUTOMATED)
Bilirubin, UA: NEGATIVE
Blood, UA: NEGATIVE
Glucose, UA: POSITIVE — AB
Ketones, UA: NEGATIVE
Leukocytes, UA: NEGATIVE
Nitrite, UA: NEGATIVE
Protein, UA: NEGATIVE
Spec Grav, UA: 1.025
Urobilinogen, UA: 0.2 U/dL
pH, UA: 5

## 2021-03-13 MED ORDER — FLUCONAZOLE 150 MG PO TABS: ORAL_TABLET | ORAL | 2 refills | Status: DC

## 2021-03-13 NOTE — Assessment & Plan Note (Signed)
Poorly controlled will alter medications, encouraged DASH diet, minimize caffeine and obtain adequate sleep. Report concerning symptoms and follow up as directed and as needed Pt had an energy drink prior to arriving  She will rto 3-4 weeks

## 2021-03-13 NOTE — Patient Instructions (Signed)

## 2021-03-13 NOTE — Assessment & Plan Note (Signed)
Check labs today.

## 2021-03-13 NOTE — Assessment & Plan Note (Signed)
Check labs today Pt is on no meds

## 2021-03-13 NOTE — Assessment & Plan Note (Signed)
D/w pt diet and exercise---she has not been able to exercise lately

## 2021-03-13 NOTE — Assessment & Plan Note (Signed)
Encourage heart healthy diet such as MIND or DASH diet, increase exercise, avoid trans fats, simple carbohydrates and processed foods, consider a krill or fish or flaxseed oil cap daily.  °

## 2021-03-13 NOTE — Assessment & Plan Note (Signed)
Pt does not want medication right now  She states she has it handled

## 2021-03-13 NOTE — Progress Notes (Addendum)
Subjective:   By signing my name below, I, Zite Okoli, attest that this documentation has been prepared under the direction and in the presence of Ann Held, DO. 03/13/2021    Patient ID: Kristin Hubbard, female    DOB: 04/09/1960, 61 y.o.   MRN: [id number]  Chief Complaint  Patient presents with   Vaginitis    Used Monostat once, cleared up a little, been going on for two weeks    HPI-- Patient is in today for an office visit c/o yeast infection x 2 weeks  She is also c/o increase stress due to her daughters marriage to a controlling husband and new baby that she has not seen.        Past Medical History:  Diagnosis Date   Anxiety    Bilateral primary osteoarthritis of knee [id number]   Complication of anesthesia    after 1 st surgery was combative,other surgeries have been fine   Hypertension    Hypothyroidism    Migraine    Thyroid disease    Hypothyroidism    Past Surgical History:  Procedure Laterality Date   BUNIONECTOMY     hammer toe   CHOLECYSTECTOMY     KNEE SURGERY  05/2007   left   TOTAL KNEE ARTHROPLASTY Bilateral 01/31/2016   Procedure: TOTAL KNEE BILATERAL;  Surgeon: Marchia Bond, MD;  Location: [facility];  Service: Orthopedics;  Laterality: Bilateral;    Family History  Problem Relation Age of Onset   Diabetes Other    Alcohol abuse Father    Diabetes Father    Heart attack Father    Alcohol abuse Mother    Kidney disease Mother     Social History   Socioeconomic History   Marital status: Single    Spouse name: Not on file   Number of children: Not on file   Years of education: Not on file   Highest education level: Not on file  Occupational History   Occupation: Retail buyer: [employer]  Tobacco Use   Smoking status: Every Day    Packs/day: 1.00    Years: 17.00    Pack years: 17.00    Types: Cigarettes   Smokeless tobacco: Never   Tobacco comments:    she said when she loses 100 lbs she will quit smoking   Substance and Sexual Activity   Alcohol use: No   Drug use: No   Sexual activity: Not Currently    Partners: Male  Other Topics Concern   Not on file  Social History Narrative   Exercise--  Clinical research associate and gym   Social Determinants of Health   Financial Resource Strain: Not on file  Food Insecurity: Not on file  Transportation Needs: Not on file  Physical Activity: Not on file  Stress: Not on file  Social Connections: Not on file  Intimate Partner Violence: Not on file    Outpatient Medications Prior to Visit  Medication Sig Dispense Refill   amLODipine (NORVASC) 5 MG tablet Take 1 tablet (5 mg total) by mouth daily. Pt needs office visit for further refills 30 tablet 0   levothyroxine (SYNTHROID) 200 MCG tablet Take 1 tablet (200 mcg total) by mouth daily. 90 tablet 0   simvastatin (ZOCOR) 40 MG tablet Take 1 tablet (40 mg total) by mouth at bedtime. Pt needs office visit for further refills 90 tablet 0   benzonatate (TESSALON) 100 MG capsule Take 1 capsule (100 mg total) by mouth 2 (  two) times daily as needed for cough. 20 capsule 0   benzonatate (TESSALON) 200 MG capsule TAKE 1 CAPSULE BY MOUTH TWICE DAILY AS NEEDED FOR COUGH 20 capsule 0   fluconazole (DIFLUCAN) 150 MG tablet 1 po x1, may repeat in 3 days prn 2 tablet 0   fluticasone (FLOVENT HFA) 110 MCG/ACT inhaler Inhale 2 puffs into the lungs in the morning and at bedtime. 12 g 3   No facility-administered medications prior to visit.    Allergies  Allergen Reactions   Lisinopril Cough   Vicodin [Hydrocodone-Acetaminophen]     Pt stated, "makes me constipated"    Review of Systems  Constitutional:  Negative for fever.  HENT:  Negative for congestion, ear pain, hearing loss, sinus pain and sore throat.   Eyes:  Negative for blurred vision and pain.  Respiratory:  Negative for cough, sputum production, shortness of breath and wheezing.   Cardiovascular:  Negative for chest pain and palpitations.  Gastrointestinal:   Negative for blood in stool, constipation, diarrhea, nausea and vomiting.  Genitourinary:  Positive for frequency. Negative for dysuria, hematuria and urgency.       + vaginal itching / d/c   Musculoskeletal:  Negative for back pain, falls and myalgias.  Neurological:  Negative for dizziness, sensory change, loss of consciousness, weakness and headaches.  Endo/Heme/Allergies:  Negative for environmental allergies. Does not bruise/bleed easily.  Psychiatric/Behavioral:  Negative for depression and suicidal ideas. The patient is not nervous/anxious and does not have insomnia.       Objective:    Physical Exam Vitals and nursing note reviewed.  Constitutional:      General: She is not in acute distress.    Appearance: Normal appearance. She is well-developed. She is not ill-appearing.  HENT:     Head: Normocephalic and atraumatic.     Right Ear: External ear normal.     Left Ear: External ear normal.  Eyes:     Extraocular Movements: Extraocular movements intact.     Conjunctiva/sclera: Conjunctivae normal.     Pupils: Pupils are equal, round, and reactive to light.  Neck:     Thyroid: No thyromegaly.     Vascular: No carotid bruit or JVD.  Cardiovascular:     Rate and Rhythm: Normal rate and regular rhythm.     Pulses: Normal pulses.     Heart sounds: Normal heart sounds. No murmur heard.   No gallop.  Pulmonary:     Effort: Pulmonary effort is normal. No respiratory distress.     Breath sounds: Normal breath sounds. No wheezing, rhonchi or rales.  Chest:     Chest wall: No tenderness.  Abdominal:     General: Bowel sounds are normal. There is no distension.     Palpations: Abdomen is soft. There is no mass.     Tenderness: There is no abdominal tenderness. There is no guarding or rebound.     Hernia: No hernia is present.  Genitourinary:    Comments: Self swab done  Musculoskeletal:     Cervical back: Normal range of motion and neck supple.  Lymphadenopathy:      Cervical: No cervical adenopathy.  Skin:    General: Skin is warm and dry.  Neurological:     Mental Status: She is alert and oriented to person, place, and time.  Psychiatric:        Mood and Affect: Mood is depressed. Affect is tearful.        Speech: Speech normal.  Behavior: Behavior normal.        Cognition and Memory: Cognition normal.    BP (!) 158/85 (BP Location: Right Arm, Patient Position: Sitting, Cuff Size: Large)    Pulse (!) 113 Comment: Just consumed energy drink   Resp 20    Wt (!) 336 lb (152.4 kg)    BMI 51.09 kg/m  Wt Readings from Last 3 Encounters:  03/13/21 (!) 336 lb (152.4 kg)  04/11/20 (!) 336 lb 3.2 oz (152.5 kg)  06/01/19 (!) 334 lb 9.6 oz (151.8 kg)    Diabetic Foot Exam - Simple   No data filed    Lab Results  Component Value Date   WBC 6.9 02/16/2019   HGB 15.2 (H) 02/16/2019   HCT 45.0 02/16/2019   PLT 193.0 02/16/2019   GLUCOSE 206 (H) 01/12/2021   CHOL 187 01/12/2021   TRIG 183.0 (H) 01/12/2021   HDL 38.80 (L) 01/12/2021   LDLDIRECT 173.6 08/22/2011   LDLCALC 111 (H) 01/12/2021   ALT 47 (H) 01/12/2021   AST 42 (H) 01/12/2021   NA 136 01/12/2021   K 4.2 01/12/2021   CL 101 01/12/2021   CREATININE 0.84 01/12/2021   BUN 17 01/12/2021   CO2 25 01/12/2021   TSH 5.44 (H) 01/12/2021   HGBA1C 9.6 (H) 01/12/2021    Lab Results  Component Value Date   TSH 5.44 (H) 01/12/2021   Lab Results  Component Value Date   WBC 6.9 02/16/2019   HGB 15.2 (H) 02/16/2019   HCT 45.0 02/16/2019   MCV 97.3 02/16/2019   PLT 193.0 02/16/2019   Lab Results  Component Value Date   NA 136 01/12/2021   K 4.2 01/12/2021   CO2 25 01/12/2021   GLUCOSE 206 (H) 01/12/2021   BUN 17 01/12/2021   CREATININE 0.84 01/12/2021   BILITOT 0.5 01/12/2021   ALKPHOS 89 01/12/2021   AST 42 (H) 01/12/2021   ALT 47 (H) 01/12/2021   PROT 7.1 01/12/2021   ALBUMIN 4.1 01/12/2021   CALCIUM 9.8 01/12/2021   ANIONGAP 7 02/01/2016   GFR 75.23 01/12/2021   Lab  Results  Component Value Date   CHOL 187 01/12/2021   Lab Results  Component Value Date   HDL 38.80 (L) 01/12/2021   Lab Results  Component Value Date   LDLCALC 111 (H) 01/12/2021   Lab Results  Component Value Date   TRIG 183.0 (H) 01/12/2021   Lab Results  Component Value Date   CHOLHDL 5 01/12/2021   Lab Results  Component Value Date   HGBA1C 9.6 (H) 01/12/2021       Assessment & Plan:   Problem List Items Addressed This Visit       Unprioritized   Hyperglycemia   Relevant Orders   Hemoglobin A1c   VITAMIN D 25 Hydroxy (Vit-D Deficiency, Fractures)   Vitamin B12   Essential hypertension    Poorly controlled will alter medications, encouraged DASH diet, minimize caffeine and obtain adequate sleep. Report concerning symptoms and follow up as directed and as needed Pt had an energy drink prior to arriving  She will rto 3-4 weeks       Hyperlipidemia    Encourage heart healthy diet such as MIND or DASH diet, increase exercise, avoid trans fats, simple carbohydrates and processed foods, consider a krill or fish or flaxseed oil cap daily.       Relevant Orders   Lipid panel   Comprehensive metabolic panel   Hypothyroidism    Check  labs today      Relevant Orders   Lipid panel   Comprehensive metabolic panel   Thyroid Panel With TSH   VITAMIN D 25 Hydroxy (Vit-D Deficiency, Fractures)   Vitamin B12   Obesity (BMI 30-39.9)    D/w pt diet and exercise---she has not been able to exercise lately      Type 2 diabetes mellitus with hyperglycemia, without long-term current use of insulin (HCC)    Check labs today Pt is on no meds       Other Visit Diagnoses     Yeast infection    -  Primary   Relevant Medications   fluconazole (DIFLUCAN) 150 MG tablet   Other Relevant Orders   POCT Urinalysis Dipstick (Automated) (Completed)   Cervicovaginal ancillary only( [facility])       Meds ordered this encounter  Medications   fluconazole (DIFLUCAN) 150  MG tablet    Sig: 1 po x1, may repeat in 3 days prn    Dispense:  2 tablet    Refill:  2    I,Zite Okoli,acting as a scribe for [employer], DO.,have documented all relevant documentation on the behalf of Ann Held, DO,as directed by  Ann Held, DO while in the presence of Ann Held, DO.   I, Ann Held, DO. , personally preformed the services described in this documentation.  All medical record entries made by the scribe were at my direction and in my presence.  I have reviewed the chart and discharge instructions (if applicable) and agree that the record reflects my personal performance and is accurate and complete. 03/13/2021

## 2021-03-14 LAB — LDL CHOLESTEROL, DIRECT: Direct LDL: 144 mg/dL

## 2021-03-14 LAB — LIPID PANEL
Cholesterol: 199 mg/dL (ref 0–200)
HDL: 39.6 mg/dL (ref >39.00)
NonHDL: 159.36
Total CHOL/HDL Ratio: 5
Triglycerides: 218 mg/dL — ABNORMAL HIGH (ref 0.0–149.0)
VLDL: 43.6 mg/dL — ABNORMAL HIGH (ref 0.0–40.0)

## 2021-03-14 LAB — COMPREHENSIVE METABOLIC PANEL
ALT: 52 U/L — ABNORMAL HIGH (ref 0–35)
AST: 45 U/L — ABNORMAL HIGH (ref 0–37)
Albumin: 4.3 g/dL (ref 3.5–5.2)
Alkaline Phosphatase: 97 U/L (ref 39–117)
BUN: 16 mg/dL (ref 6–23)
CO2: 27 mEq/L (ref 19–32)
Calcium: 9.6 mg/dL (ref 8.4–10.5)
Chloride: 100 mEq/L (ref 96–112)
Creatinine, Ser: 0.9 mg/dL (ref 0.40–1.20)
GFR: 69.17 mL/min (ref >60.00)
Glucose, Bld: 255 mg/dL — ABNORMAL HIGH (ref 70–99)
Potassium: 3.8 mEq/L (ref 3.5–5.1)
Sodium: 136 mEq/L (ref 135–145)
Total Bilirubin: 0.5 mg/dL (ref 0.2–1.2)
Total Protein: 7.3 g/dL (ref 6.0–8.3)

## 2021-03-14 LAB — VITAMIN D 25 HYDROXY (VIT D DEFICIENCY, FRACTURES): VITD: 25.15 ng/mL — ABNORMAL LOW (ref 30.00–100.00)

## 2021-03-14 LAB — HEMOGLOBIN A1C: Hgb A1c MFr Bld: 10.2 % — ABNORMAL HIGH (ref 4.6–6.5)

## 2021-03-14 LAB — VITAMIN B12: Vitamin B-12: 982 pg/mL — ABNORMAL HIGH (ref 211–911)

## 2021-03-15 LAB — CERVICOVAGINAL ANCILLARY ONLY
Bacterial Vaginitis (gardnerella): NEGATIVE
Candida Glabrata: NEGATIVE
Candida Vaginitis: POSITIVE — AB
Comment: NEGATIVE
Comment: NEGATIVE
Comment: NEGATIVE

## 2021-03-22 ENCOUNTER — Encounter: Payer: Self-pay | Admitting: Family Medicine

## 2021-03-22 DIAGNOSIS — E1165 Type 2 diabetes mellitus with hyperglycemia: Secondary | ICD-10-CM

## 2021-03-22 MED ORDER — METFORMIN HCL ER 500 MG PO TB24: 500.0000 mg | ORAL_TABLET | Freq: Every day | ORAL | 2 refills | Status: DC

## 2021-03-22 MED ORDER — VITAMIN D (ERGOCALCIFEROL) 1.25 MG (50000 UNIT) PO CAPS: 50000.0000 [IU] | ORAL_CAPSULE | ORAL | 1 refills | Status: DC

## 2021-03-22 MED ORDER — BLOOD GLUCOSE METER KIT
PACK | 0 refills | Status: AC
Start: 2021-03-22 — End: ?

## 2021-03-30 MED ORDER — ROSUVASTATIN CALCIUM 20 MG PO TABS
20.0000 mg | ORAL_TABLET | Freq: Every day | ORAL | 2 refills | Status: DC
Start: 2021-03-30 — End: 2021-05-01

## 2021-03-30 MED ORDER — METFORMIN HCL ER 500 MG PO TB24: 1000.0000 mg | ORAL_TABLET | Freq: Every day | ORAL | 2 refills | Status: DC

## 2021-03-30 MED ORDER — AMLODIPINE BESYLATE 5 MG PO TABS: 5.0000 mg | ORAL_TABLET | Freq: Every day | ORAL | 1 refills | Status: DC

## 2021-03-30 NOTE — Telephone Encounter (Signed)
Pt is diabetic ----- she needs to see a nutritionist ,  start metformin xr 500 mg 2 po qd #60  2 refils  ?Cholesterol--- LDL goal < 70,  HDL >40,  TG < 150.  Diet and exercise will increase HDL and decrease LDL and TG.  Fish,  Fish Oil, Flaxseed oil will also help increase the HDL and decrease Triglycerides.   Recheck labs in 3 months ?Start crestor 20 mg 1 po qhs, 2 refills  ?Vita d is low--- take vita d 50,000 u weekly #12 and take otc vita d3 1000 qd  ?Will need appointment for diabetic ed--- it think [facility] can do that?  She will need a glucometer as well  ?

## 2021-03-30 NOTE — Addendum Note (Signed)
Addended byConrad Quincy D on: 03/30/2021 01:32 PM ? ? Modules accepted: Orders ? ?

## 2021-04-17 ENCOUNTER — Other Ambulatory Visit: Payer: Self-pay | Admitting: Family Medicine

## 2021-05-01 ENCOUNTER — Encounter: Payer: Self-pay | Admitting: Family Medicine

## 2021-05-01 MED ORDER — METFORMIN HCL ER 500 MG PO TB24: 1000.0000 mg | ORAL_TABLET | Freq: Every day | ORAL | 1 refills | Status: DC

## 2021-05-01 MED ORDER — VITAMIN D (ERGOCALCIFEROL) 1.25 MG (50000 UNIT) PO CAPS: 50000.0000 [IU] | ORAL_CAPSULE | ORAL | 1 refills | Status: DC

## 2021-05-01 MED ORDER — ROSUVASTATIN CALCIUM 20 MG PO TABS: 20.0000 mg | ORAL_TABLET | Freq: Every day | ORAL | 1 refills | Status: DC

## 2021-05-01 MED ORDER — LEVOTHYROXINE SODIUM 200 MCG PO TABS
200.0000 ug | ORAL_TABLET | Freq: Every day | ORAL | 1 refills | Status: DC
Start: 2021-05-01 — End: 2021-10-31

## 2021-05-01 MED ORDER — AMLODIPINE BESYLATE 5 MG PO TABS: 5.0000 mg | ORAL_TABLET | Freq: Every day | ORAL | 1 refills | Status: DC

## 2021-08-02 ENCOUNTER — Encounter: Payer: Self-pay | Admitting: Family Medicine

## 2021-08-02 MED ORDER — ACCU-CHEK GUIDE VI STRP: ORAL_STRIP | 12 refills | Status: DC

## 2021-08-15 ENCOUNTER — Encounter: Payer: Self-pay | Admitting: Family Medicine

## 2021-10-31 ENCOUNTER — Other Ambulatory Visit: Payer: Self-pay | Admitting: Family Medicine

## 2021-11-30 ENCOUNTER — Encounter: Payer: Self-pay | Admitting: Family Medicine

## 2021-11-30 ENCOUNTER — Ambulatory Visit: Payer: PRIVATE HEALTH INSURANCE | Admitting: Family Medicine

## 2021-11-30 VITALS — BP 156/58 | HR 110 | Temp 98.4°F | Ht 68.0 in | Wt 299.4 lb

## 2021-11-30 DIAGNOSIS — E1165 Type 2 diabetes mellitus with hyperglycemia: Secondary | ICD-10-CM | POA: Diagnosis not present

## 2021-11-30 DIAGNOSIS — I10 Essential (primary) hypertension: Secondary | ICD-10-CM

## 2021-11-30 DIAGNOSIS — E039 Hypothyroidism, unspecified: Secondary | ICD-10-CM | POA: Diagnosis not present

## 2021-11-30 DIAGNOSIS — Z1231 Encounter for screening mammogram for malignant neoplasm of breast: Secondary | ICD-10-CM

## 2021-11-30 DIAGNOSIS — E785 Hyperlipidemia, unspecified: Secondary | ICD-10-CM

## 2021-11-30 DIAGNOSIS — F321 Major depressive disorder, single episode, moderate: Secondary | ICD-10-CM

## 2021-11-30 DIAGNOSIS — E669 Obesity, unspecified: Secondary | ICD-10-CM | POA: Diagnosis not present

## 2021-11-30 MED ORDER — ROSUVASTATIN CALCIUM 20 MG PO TABS: 20.0000 mg | ORAL_TABLET | Freq: Every day | ORAL | 1 refills | Status: DC

## 2021-11-30 MED ORDER — METFORMIN HCL ER 500 MG PO TB24: 1000.0000 mg | ORAL_TABLET | Freq: Every day | ORAL | 1 refills | Status: DC

## 2021-11-30 MED ORDER — AMLODIPINE BESYLATE 5 MG PO TABS: 5.0000 mg | ORAL_TABLET | Freq: Every day | ORAL | 1 refills | Status: DC

## 2021-11-30 MED ORDER — LEVOTHYROXINE SODIUM 200 MCG PO TABS: 200.0000 ug | ORAL_TABLET | Freq: Every day | ORAL | 1 refills | Status: DC

## 2021-11-30 NOTE — Progress Notes (Signed)
Established Patient Office Visit  Subjective   Patient ID: Kristin Hubbard, female    DOB: 01-Aug-1960  Age: 61 y.o. MRN: [id number]  Chief Complaint  Patient presents with   Hypertension   Hypothyroidism   Hyperlipidemia   Follow-up    HPI Pt is here f/u dm , chol bp and thyroid.  HPI HYPERTENSION  Blood pressure range-lower at home   Chest pain- no      Dyspnea- no Lightheadedness- no   Edema- no Other side effects - no   Medication compliance: good Low salt diet- yes  DIABETES  Blood Sugar ranges-am 135-150  in pm 115 range   Polyuria- no New Visual problems- no Hypoglycemic symptoms- no Other side effects-no Medication compliance - good Last eye exam- due Foot exam- today  HYPERLIPIDEMIA  Medication compliance- good  RUQ pain- no  Muscle aches- no Other side effects-no    Patient Active Problem List   Diagnosis Date Noted   Depression, major, single episode, moderate (HCC) 11/30/2021   Type 2 diabetes mellitus with hyperglycemia, without long-term current use of insulin (HCC) 03/13/2021   Hair loss 04/11/2020   Left hip pain 04/21/2019   Chronic right shoulder pain 04/21/2019   Left flank pain 09/18/2018   Left lower quadrant abdominal pain 02/10/2018   Hyperglycemia 02/10/2018   Tobacco abuse 02/10/2018   S/P total knee arthroplasty, bilateral 01/31/2016   Obesity (BMI 30-39.9) 01/23/2013   Hyperlipidemia 10/31/2011   OTHER ACUTE REACTIONS TO STRESS 05/05/2009   FATIGUE 07/22/2007   Morbid obesity (HCC) 08/23/2006   Hypothyroidism 03/12/2006   Essential hypertension 03/12/2006   Past Medical History:  Diagnosis Date   Anxiety    Bilateral primary osteoarthritis of knee 01/31/2016   Complication of anesthesia    after 1 st surgery was combative,other surgeries have been fine   Hypertension    Hypothyroidism    Migraine    Thyroid disease    Hypothyroidism   Past Surgical History:  Procedure Laterality Date   BUNIONECTOMY     hammer toe    CHOLECYSTECTOMY     KNEE SURGERY  05/2007   left   TOTAL KNEE ARTHROPLASTY Bilateral 01/31/2016   Procedure: TOTAL KNEE BILATERAL;  Surgeon: Teryl Lucy, MD;  Location: [facility] OR;  Service: Orthopedics;  Laterality: Bilateral;   Social History   Tobacco Use   Smoking status: Every Day    Packs/day: 1.00    Years: 17.00    Total pack years: 17.00    Types: Cigarettes   Smokeless tobacco: Never   Tobacco comments:    she said when she loses 100 lbs she will quit smoking  Substance Use Topics   Alcohol use: No   Drug use: No   Social History   Socioeconomic History   Marital status: Single    Spouse name: Not on file   Number of children: Not on file   Years of education: Not on file   Highest education level: Not on file  Occupational History   Occupation: Database administrator: [employer]  Tobacco Use   Smoking status: Every Day    Packs/day: 1.00    Years: 17.00    Total pack years: 17.00    Types: Cigarettes   Smokeless tobacco: Never   Tobacco comments:    she said when she loses 100 lbs she will quit smoking  Substance and Sexual Activity   Alcohol use: No   Drug use: No   Sexual activity:  Not Currently    Partners: Male  Other Topics Concern   Not on file  Social History Narrative   Exercise--  Psychologist, educational and gym   Social Determinants of Health   Financial Resource Strain: Not on file  Food Insecurity: Not on file  Transportation Needs: Not on file  Physical Activity: Not on file  Stress: Not on file  Social Connections: Not on file  Intimate Partner Violence: Not on file   Family Status  Relation Name Status   Other  (Not Specified)   Father  Deceased   Mother  Deceased   Family History  Problem Relation Age of Onset   Diabetes Other    Alcohol abuse Father    Diabetes Father    Heart attack Father    Alcohol abuse Mother    Kidney disease Mother    Allergies  Allergen Reactions   Lisinopril Cough   Vicodin  [Hydrocodone-Acetaminophen]     Pt stated, "makes me constipated"      Review of Systems  Constitutional:  Negative for chills, fever and malaise/fatigue.  HENT:  Negative for congestion and hearing loss.   Eyes:  Negative for discharge.  Respiratory:  Negative for cough, sputum production and shortness of breath.   Cardiovascular:  Negative for chest pain, palpitations and leg swelling.  Gastrointestinal:  Negative for abdominal pain, blood in stool, constipation, diarrhea, heartburn, nausea and vomiting.  Genitourinary:  Negative for dysuria, frequency, hematuria and urgency.  Musculoskeletal:  Negative for back pain, falls and myalgias.  Skin:  Negative for rash.  Neurological:  Negative for dizziness, sensory change, loss of consciousness, weakness and headaches.  Endo/Heme/Allergies:  Negative for environmental allergies. Does not bruise/bleed easily.  Psychiatric/Behavioral:  Negative for depression and suicidal ideas. The patient is not nervous/anxious and does not have insomnia.       Objective:     BP (!) 156/58   Pulse (!) 110   Temp 98.4 F (36.9 C)   Ht 5\' 8"  (1.727 m)   Wt 299 lb 6.4 oz (135.8 kg)   SpO2 98%   BMI 45.52 kg/m  BP Readings from Last 3 Encounters:  11/30/21 (!) 156/58  03/13/21 (!) 158/85  04/11/20 122/74   Wt Readings from Last 3 Encounters:  11/30/21 299 lb 6.4 oz (135.8 kg)  03/13/21 (!) 336 lb (152.4 kg)  04/11/20 (!) 336 lb 3.2 oz (152.5 kg)   SpO2 Readings from Last 3 Encounters:  11/30/21 98%  04/11/20 98%  06/01/19 95%      Physical Exam Vitals and nursing note reviewed.  Constitutional:      Appearance: She is well-developed.  HENT:     Head: Normocephalic and atraumatic.  Eyes:     Conjunctiva/sclera: Conjunctivae normal.  Neck:     Thyroid: No thyromegaly.     Vascular: No carotid bruit or JVD.  Cardiovascular:     Rate and Rhythm: Normal rate and regular rhythm.     Heart sounds: Normal heart sounds. No murmur  heard. Pulmonary:     Effort: Pulmonary effort is normal. No respiratory distress.     Breath sounds: Normal breath sounds. No wheezing or rales.  Chest:     Chest wall: No tenderness.  Musculoskeletal:        General: Normal range of motion.     Cervical back: Normal range of motion and neck supple.  Neurological:     General: No focal deficit present.     Mental Status: She is  alert and oriented to person, place, and time.  Psychiatric:        Mood and Affect: Mood normal.        Behavior: Behavior normal.        Thought Content: Thought content normal.        Judgment: Judgment normal.      Results for orders placed or performed in visit on 11/30/21  Microalbumin / creatinine urine ratio  Result Value Ref Range   Microalb, Ur <0.7 0.0 - 1.9 mg/dL   Creatinine,U 40.9 mg/dL   Microalb Creat Ratio 2.1 0.0 - 30.0 mg/g  CBC with Differential/Platelet  Result Value Ref Range   WBC 8.3 4.0 - 10.5 K/uL   RBC 4.42 3.87 - 5.11 Mil/uL   Hemoglobin 14.3 12.0 - 15.0 g/dL   HCT 81.1 91.4 - 78.2 %   MCV 94.9 78.0 - 100.0 fl   MCHC 34.1 30.0 - 36.0 g/dL   RDW 95.6 21.3 - 08.6 %   Platelets 178.0 150.0 - 400.0 K/uL   Neutrophils Relative % 62.3 43.0 - 77.0 %   Lymphocytes Relative 28.1 12.0 - 46.0 %   Monocytes Relative 6.5 3.0 - 12.0 %   Eosinophils Relative 1.9 0.0 - 5.0 %   Basophils Relative 1.2 0.0 - 3.0 %   Neutro Abs 5.2 1.4 - 7.7 K/uL   Lymphs Abs 2.3 0.7 - 4.0 K/uL   Monocytes Absolute 0.5 0.1 - 1.0 K/uL   Eosinophils Absolute 0.2 0.0 - 0.7 K/uL   Basophils Absolute 0.1 0.0 - 0.1 K/uL  Comprehensive metabolic panel  Result Value Ref Range   Sodium 135 135 - 145 mEq/L   Potassium 4.0 3.5 - 5.1 mEq/L   Chloride 102 96 - 112 mEq/L   CO2 25 19 - 32 mEq/L   Glucose, Bld 133 (H) 70 - 99 mg/dL   BUN 13 6 - 23 mg/dL   Creatinine, Ser 5.78 0.40 - 1.20 mg/dL   Total Bilirubin 0.4 0.2 - 1.2 mg/dL   Alkaline Phosphatase 76 39 - 117 U/L   AST 22 0 - 37 U/L   ALT 25 0 - 35 U/L    Total Protein 7.0 6.0 - 8.3 g/dL   Albumin 4.2 3.5 - 5.2 g/dL   GFR 46.96 >29.52 mL/min   Calcium 9.3 8.4 - 10.5 mg/dL  Hemoglobin W4X  Result Value Ref Range   Hgb A1c MFr Bld 8.0 (H) 4.6 - 6.5 %  Lipid panel  Result Value Ref Range   Cholesterol 131 0 - 200 mg/dL   Triglycerides 324.4 0.0 - 149.0 mg/dL   HDL 01.02 >72.53 mg/dL   VLDL 66.4 0.0 - 40.3 mg/dL   LDL Cholesterol 70 0 - 99 mg/dL   Total CHOL/HDL Ratio 3    NonHDL 91.50   TSH  Result Value Ref Range   TSH 0.03 Repeated and verified X2. (L) 0.35 - 5.50 uIU/mL    Last CBC Lab Results  Component Value Date   WBC 8.3 11/30/2021   HGB 14.3 11/30/2021   HCT 42.0 11/30/2021   MCV 94.9 11/30/2021   MCH 31.3 02/03/2016   RDW 12.9 11/30/2021   PLT 178.0 11/30/2021   Last metabolic panel Lab Results  Component Value Date   GLUCOSE 133 (H) 11/30/2021   NA 135 11/30/2021   K 4.0 11/30/2021   CL 102 11/30/2021   CO2 25 11/30/2021   BUN 13 11/30/2021   CREATININE 0.61 11/30/2021   GFRNONAA >60 02/01/2016  CALCIUM 9.3 11/30/2021   PROT 7.0 11/30/2021   ALBUMIN 4.2 11/30/2021   BILITOT 0.4 11/30/2021   ALKPHOS 76 11/30/2021   AST 22 11/30/2021   ALT 25 11/30/2021   ANIONGAP 7 02/01/2016   Last lipids Lab Results  Component Value Date   CHOL 131 11/30/2021   HDL 39.70 11/30/2021   LDLCALC 70 11/30/2021   LDLDIRECT 144.0 03/13/2021   TRIG 110.0 11/30/2021   CHOLHDL 3 11/30/2021   Last hemoglobin A1c Lab Results  Component Value Date   HGBA1C 8.0 (H) 11/30/2021   Last thyroid functions Lab Results  Component Value Date   TSH 0.03 Repeated and verified X2. (L) 11/30/2021   T4TOTAL 13.4 (H) 03/13/2021   Last vitamin D Lab Results  Component Value Date   VD25OH 25.15 (L) 03/13/2021   Last vitamin B12 and Folate Lab Results  Component Value Date   VITAMINB12 982 (H) 03/13/2021      The 10-year ASCVD risk score (Arnett DK, et al., 2019) is: 23.7%    Assessment & Plan:   Problem List  Items Addressed This Visit       Unprioritized   Type 2 diabetes mellitus with hyperglycemia, without long-term current use of insulin (HCC) - Primary    hgba1c to be checked,  minimize simple carbs. Increase exercise as tolerated. Continue current meds       Relevant Medications   metFORMIN (GLUCOPHAGE-XR) 500 MG 24 hr tablet   rosuvastatin (CRESTOR) 20 MG tablet   Other Relevant Orders   Microalbumin / creatinine urine ratio (Completed)   CBC with Differential/Platelet (Completed)   Comprehensive metabolic panel (Completed)   Hemoglobin A1c (Completed)   Lipid panel (Completed)   Microalbumin / creatinine urine ratio   Obesity (BMI 30-39.9)   Relevant Medications   metFORMIN (GLUCOPHAGE-XR) 500 MG 24 hr tablet   Morbid obesity (HCC)   Relevant Medications   metFORMIN (GLUCOPHAGE-XR) 500 MG 24 hr tablet   Hypothyroidism    Check tsh       Relevant Medications   levothyroxine (SYNTHROID) 200 MCG tablet   Other Relevant Orders   TSH (Completed)   Hyperlipidemia    Encourage heart healthy diet such as MIND or DASH diet, increase exercise, avoid trans fats, simple carbohydrates and processed foods, consider a krill or fish or flaxseed oil cap daily.        Relevant Medications   rosuvastatin (CRESTOR) 20 MG tablet   amLODipine (NORVASC) 5 MG tablet   Other Relevant Orders   Comprehensive metabolic panel (Completed)   Lipid panel (Completed)   Essential hypertension    Well controlled, no changes to meds. Encouraged heart healthy diet such as the DASH diet and exercise as tolerated.        Relevant Medications   levothyroxine (SYNTHROID) 200 MCG tablet   metFORMIN (GLUCOPHAGE-XR) 500 MG 24 hr tablet   rosuvastatin (CRESTOR) 20 MG tablet   amLODipine (NORVASC) 5 MG tablet   Other Relevant Orders   CBC with Differential/Platelet (Completed)   Comprehensive metabolic panel (Completed)   Microalbumin / creatinine urine ratio   Depression, major, single episode,  moderate (HCC)   Other Visit Diagnoses     Screening mammogram for breast cancer       Relevant Orders   MM DIGITAL SCREENING BILATERAL       Return in about 6 months (around 05/31/2022), or if symptoms worsen or fail to improve, for annual exam, fasting.    Donato Schultz, DO

## 2021-11-30 NOTE — Assessment & Plan Note (Signed)
Well controlled, no changes to meds. Encouraged heart healthy diet such as the DASH diet and exercise as tolerated.  °

## 2021-11-30 NOTE — Assessment & Plan Note (Signed)
hgba1c to be checked, minimize simple carbs. Increase exercise as tolerated. Continue current meds  

## 2021-11-30 NOTE — Assessment & Plan Note (Signed)
Check tsh 

## 2021-11-30 NOTE — Patient Instructions (Signed)

## 2021-11-30 NOTE — Assessment & Plan Note (Signed)
Encourage heart healthy diet such as MIND or DASH diet, increase exercise, avoid trans fats, simple carbohydrates and processed foods, consider a krill or fish or flaxseed oil cap daily.  °

## 2021-12-01 LAB — CBC WITH DIFFERENTIAL/PLATELET
Basophils Absolute: 0.1 10*3/uL (ref 0.0–0.1)
Basophils Relative: 1.2 % (ref 0.0–3.0)
Eosinophils Absolute: 0.2 10*3/uL (ref 0.0–0.7)
Eosinophils Relative: 1.9 % (ref 0.0–5.0)
HCT: 42 % (ref 36.0–46.0)
Hemoglobin: 14.3 g/dL (ref 12.0–15.0)
Lymphocytes Relative: 28.1 % (ref 12.0–46.0)
Lymphs Abs: 2.3 10*3/uL (ref 0.7–4.0)
MCHC: 34.1 g/dL (ref 30.0–36.0)
MCV: 94.9 fl (ref 78.0–100.0)
Monocytes Absolute: 0.5 10*3/uL (ref 0.1–1.0)
Monocytes Relative: 6.5 % (ref 3.0–12.0)
Neutro Abs: 5.2 10*3/uL (ref 1.4–7.7)
Neutrophils Relative %: 62.3 % (ref 43.0–77.0)
Platelets: 178 10*3/uL (ref 150.0–400.0)
RBC: 4.42 Mil/uL (ref 3.87–5.11)
RDW: 12.9 % (ref 11.5–15.5)
WBC: 8.3 10*3/uL (ref 4.0–10.5)

## 2021-12-01 LAB — MICROALBUMIN / CREATININE URINE RATIO
Creatinine,U: 33.9 mg/dL
Microalb Creat Ratio: 2.1 mg/g (ref 0.0–30.0)
Microalb, Ur: <0.7 mg/dL (ref 0.0–1.9)

## 2021-12-01 LAB — LIPID PANEL
Cholesterol: 131 mg/dL (ref 0–200)
HDL: 39.7 mg/dL (ref >39.00)
LDL Cholesterol: 70 mg/dL (ref 0–99)
NonHDL: 91.5
Total CHOL/HDL Ratio: 3
Triglycerides: 110 mg/dL (ref 0.0–149.0)
VLDL: 22 mg/dL (ref 0.0–40.0)

## 2021-12-01 LAB — COMPREHENSIVE METABOLIC PANEL
ALT: 25 U/L (ref 0–35)
AST: 22 U/L (ref 0–37)
Albumin: 4.2 g/dL (ref 3.5–5.2)
Alkaline Phosphatase: 76 U/L (ref 39–117)
BUN: 13 mg/dL (ref 6–23)
CO2: 25 mEq/L (ref 19–32)
Calcium: 9.3 mg/dL (ref 8.4–10.5)
Chloride: 102 mEq/L (ref 96–112)
Creatinine, Ser: 0.61 mg/dL (ref 0.40–1.20)
GFR: 96.19 mL/min (ref >60.00)
Glucose, Bld: 133 mg/dL — ABNORMAL HIGH (ref 70–99)
Potassium: 4 mEq/L (ref 3.5–5.1)
Sodium: 135 mEq/L (ref 135–145)
Total Bilirubin: 0.4 mg/dL (ref 0.2–1.2)
Total Protein: 7 g/dL (ref 6.0–8.3)

## 2021-12-01 LAB — HEMOGLOBIN A1C: Hgb A1c MFr Bld: 8 % — ABNORMAL HIGH (ref 4.6–6.5)

## 2021-12-01 LAB — TSH: TSH: 0.03 u[IU]/mL — ABNORMAL LOW (ref 0.35–5.50)

## 2021-12-05 ENCOUNTER — Encounter: Payer: Self-pay | Admitting: Family Medicine

## 2021-12-05 ENCOUNTER — Other Ambulatory Visit: Payer: Self-pay | Admitting: Family Medicine

## 2021-12-05 DIAGNOSIS — E1165 Type 2 diabetes mellitus with hyperglycemia: Secondary | ICD-10-CM

## 2021-12-05 DIAGNOSIS — E039 Hypothyroidism, unspecified: Secondary | ICD-10-CM

## 2021-12-05 MED ORDER — LEVOTHYROXINE SODIUM 175 MCG PO TABS: 175.0000 ug | ORAL_TABLET | Freq: Every day | ORAL | 0 refills | Status: DC

## 2021-12-05 NOTE — Telephone Encounter (Signed)
We need to dec synthroid to 175 mcg daily #30  2 refills  and dm not controlled either -----  we can try to add ozempic 0.25 mg sq weekly--- this can help with her weight as well  Recheck 3 months ---  lab only ok

## 2022-02-27 ENCOUNTER — Other Ambulatory Visit: Payer: Self-pay | Admitting: Family Medicine

## 2022-05-24 ENCOUNTER — Other Ambulatory Visit: Payer: Self-pay | Admitting: Family Medicine

## 2022-05-24 DIAGNOSIS — E785 Hyperlipidemia, unspecified: Secondary | ICD-10-CM

## 2022-05-24 DIAGNOSIS — E1165 Type 2 diabetes mellitus with hyperglycemia: Secondary | ICD-10-CM

## 2022-05-24 DIAGNOSIS — I10 Essential (primary) hypertension: Secondary | ICD-10-CM

## 2022-06-10 ENCOUNTER — Other Ambulatory Visit: Payer: Self-pay | Admitting: Family Medicine

## 2022-06-10 ENCOUNTER — Encounter: Payer: Self-pay | Admitting: Family Medicine

## 2022-06-10 DIAGNOSIS — I10 Essential (primary) hypertension: Secondary | ICD-10-CM

## 2022-06-11 MED ORDER — LEVOTHYROXINE SODIUM 175 MCG PO TABS: 175.0000 ug | ORAL_TABLET | Freq: Every day | ORAL | 1 refills | Status: DC

## 2022-06-11 MED ORDER — AMLODIPINE BESYLATE 5 MG PO TABS
5.0000 mg | ORAL_TABLET | Freq: Every day | ORAL | 1 refills | Status: DC
Start: 2022-06-11 — End: 2022-12-31

## 2022-06-15 ENCOUNTER — Ambulatory Visit: Payer: PRIVATE HEALTH INSURANCE | Admitting: Family Medicine

## 2022-07-13 ENCOUNTER — Ambulatory Visit: Payer: PRIVATE HEALTH INSURANCE | Admitting: Family Medicine

## 2022-07-13 ENCOUNTER — Encounter: Payer: Self-pay | Admitting: Family Medicine

## 2022-07-13 VITALS — BP 140/60 | HR 108 | Temp 97.7°F | Resp 20 | Ht 68.0 in | Wt 291.6 lb

## 2022-07-13 DIAGNOSIS — M7711 Lateral epicondylitis, right elbow: Secondary | ICD-10-CM | POA: Insufficient documentation

## 2022-07-13 DIAGNOSIS — E1165 Type 2 diabetes mellitus with hyperglycemia: Secondary | ICD-10-CM | POA: Diagnosis not present

## 2022-07-13 DIAGNOSIS — I10 Essential (primary) hypertension: Secondary | ICD-10-CM

## 2022-07-13 DIAGNOSIS — E039 Hypothyroidism, unspecified: Secondary | ICD-10-CM | POA: Diagnosis not present

## 2022-07-13 DIAGNOSIS — E785 Hyperlipidemia, unspecified: Secondary | ICD-10-CM

## 2022-07-13 DIAGNOSIS — Z7984 Long term (current) use of oral hypoglycemic drugs: Secondary | ICD-10-CM

## 2022-07-13 LAB — COMPREHENSIVE METABOLIC PANEL
ALT: 29 U/L (ref 0–35)
AST: 24 U/L (ref 0–37)
Albumin: 4.2 g/dL (ref 3.5–5.2)
Alkaline Phosphatase: 78 U/L (ref 39–117)
BUN: 12 mg/dL (ref 6–23)
CO2: 25 mEq/L (ref 19–32)
Calcium: 9.3 mg/dL (ref 8.4–10.5)
Chloride: 102 mEq/L (ref 96–112)
Creatinine, Ser: 0.65 mg/dL (ref 0.40–1.20)
GFR: 94.32 mL/min (ref >60.00)
Glucose, Bld: 154 mg/dL — ABNORMAL HIGH (ref 70–99)
Potassium: 4.1 mEq/L (ref 3.5–5.1)
Sodium: 136 mEq/L (ref 135–145)
Total Bilirubin: 0.5 mg/dL (ref 0.2–1.2)
Total Protein: 7.2 g/dL (ref 6.0–8.3)

## 2022-07-13 LAB — LIPID PANEL
Cholesterol: 143 mg/dL (ref 0–200)
HDL: 39.5 mg/dL (ref >39.00)
LDL Cholesterol: 71 mg/dL (ref 0–99)
NonHDL: 103.88
Total CHOL/HDL Ratio: 4
Triglycerides: 164 mg/dL — ABNORMAL HIGH (ref 0.0–149.0)
VLDL: 32.8 mg/dL (ref 0.0–40.0)

## 2022-07-13 LAB — TSH: TSH: 0.08 u[IU]/mL — ABNORMAL LOW (ref 0.35–5.50)

## 2022-07-13 LAB — HEMOGLOBIN A1C: Hgb A1c MFr Bld: 7.7 % — ABNORMAL HIGH (ref 4.6–6.5)

## 2022-07-13 MED ORDER — PREDNISONE 10 MG PO TABS
ORAL_TABLET | ORAL | 0 refills | Status: DC
Start: 2022-07-13 — End: 2022-07-13

## 2022-07-13 MED ORDER — PREDNISONE 10 MG PO TABS
ORAL_TABLET | ORAL | 0 refills | Status: DC
Start: 2022-07-13 — End: 2022-09-07

## 2022-07-13 NOTE — Assessment & Plan Note (Signed)
Encourage heart healthy diet such as MIND or DASH diet, increase exercise, avoid trans fats, simple carbohydrates and processed foods, consider a krill or fish or flaxseed oil cap daily.  °

## 2022-07-13 NOTE — Assessment & Plan Note (Signed)
Tennis elbow strap Pred taper  Return to office if no improvement

## 2022-07-13 NOTE — Assessment & Plan Note (Signed)
Well controlled, no changes to meds. Encouraged heart healthy diet such as the DASH diet and exercise as tolerated.  °

## 2022-07-13 NOTE — Progress Notes (Signed)
Established Patient Office Visit  Subjective   Patient ID: Kristin Hubbard, female    DOB: [birth year]  Age: 62 y.o. MRN: [id number]  Chief Complaint  Patient presents with   Elbow Pain    Right elbow, almost 2 weeks, no falls or injuries    HPI Discussed the use of AI scribe software for clinical note transcription with the patient, who gave verbal consent to proceed.  History of Present Illness   The patient, with a history of thyroid issues, presents with elbow pain that they believe is due to their work-from-home setup. They describe their desk as small, which results in their arm not being fully supported while they use their mouse. The pain began in the elbow and has since spread to include the forearm and back of the neck. The patient saw another doctor, Dr. Sandi Mariscal, who suggested it could be due to repetitive strain, but no formal treatment was initiated.  In addition to the elbow pain, the patient has noticed swelling in one leg and hand, which they suspect could be lymphedema. They have been researching potential treatments, including lymphatic pumps and massage, but have not yet pursued any of these options.  The patient also mentions ongoing stress related to family issues and changes at their workplace. They express concern about their sister's drinking problem and their strained relationship with their daughter. They also mention their workplace transitioning to a new healthcare provider, which has caused some dissatisfaction among their colleagues.      Patient Active Problem List   Diagnosis Date Noted   Lateral epicondylitis of right elbow 07/13/2022   Depression, major, single episode, moderate (HCC) 11/30/2021   Type 2 diabetes mellitus with hyperglycemia, without long-term current use of insulin (HCC) 03/13/2021   Hair loss 04/11/2020   Left hip pain 04/21/2019   Chronic right shoulder pain 04/21/2019   Left flank pain 09/18/2018   Left lower quadrant abdominal pain  02/10/2018   Hyperglycemia 02/10/2018   Tobacco abuse 02/10/2018   S/P total knee arthroplasty, bilateral 01/31/2016   Obesity (BMI 30-39.9) 01/23/2013   Hyperlipidemia 10/31/2011   OTHER ACUTE REACTIONS TO STRESS 05/05/2009   FATIGUE 07/22/2007   Morbid obesity (HCC) 08/23/2006   Hypothyroidism 03/12/2006   Essential hypertension 03/12/2006   Past Medical History:  Diagnosis Date   Anxiety    Bilateral primary osteoarthritis of knee 01/31/2016   Complication of anesthesia    after 1 st surgery was combative,other surgeries have been fine   Hypertension    Hypothyroidism    Migraine    Thyroid disease    Hypothyroidism   Past Surgical History:  Procedure Laterality Date   BUNIONECTOMY     hammer toe   CHOLECYSTECTOMY     KNEE SURGERY  05/2007   left   TOTAL KNEE ARTHROPLASTY Bilateral 01/31/2016   Procedure: TOTAL KNEE BILATERAL;  Surgeon: Teryl Lucy, MD;  Location: [facility] OR;  Service: Orthopedics;  Laterality: Bilateral;   Social History   Tobacco Use   Smoking status: Every Day    Packs/day: 1.00    Years: 17.00    Additional pack years: 0.00    Total pack years: 17.00    Types: Cigarettes   Smokeless tobacco: Never   Tobacco comments:    she said when she loses 100 lbs she will quit smoking  Substance Use Topics   Alcohol use: No   Drug use: No   Social History   Socioeconomic History   Marital status: Single  Spouse name: Not on file   Number of children: Not on file   Years of education: Not on file   Highest education level: Not on file  Occupational History   Occupation: Database administrator: [employer]  Tobacco Use   Smoking status: Every Day    Packs/day: 1.00    Years: 17.00    Additional pack years: 0.00    Total pack years: 17.00    Types: Cigarettes   Smokeless tobacco: Never   Tobacco comments:    she said when she loses 100 lbs she will quit smoking  Substance and Sexual Activity   Alcohol use: No   Drug use: No   Sexual  activity: Not Currently    Partners: Male  Other Topics Concern   Not on file  Social History Narrative   Exercise--  Trainer and gym   Social Determinants of Health   Financial Resource Strain: Not on file  Food Insecurity: Not on file  Transportation Needs: Not on file  Physical Activity: Not on file  Stress: Not on file  Social Connections: Not on file  Intimate Partner Violence: Not on file   Family Status  Relation Name Status   Other  (Not Specified)   Father  Deceased   Mother  Deceased   Family History  Problem Relation Age of Onset   Diabetes Other    Alcohol abuse Father    Diabetes Father    Heart attack Father    Alcohol abuse Mother    Kidney disease Mother    Allergies  Allergen Reactions   Lisinopril Cough   Vicodin [Hydrocodone-Acetaminophen]     Pt stated, "makes me constipated"      ROS    Objective:     BP (!) 140/60 (BP Location: Left Arm, Patient Position: Sitting, Cuff Size: Large)   Pulse (!) 108   Temp 97.7 F (36.5 C) (Oral)   Resp 20   Ht 5\' 8"  (1.727 m)   Wt 291 lb 9.6 oz (132.3 kg)   SpO2 97%   BMI 44.34 kg/m  BP Readings from Last 3 Encounters:  07/13/22 (!) 140/60  11/30/21 (!) 156/58  03/13/21 (!) 158/85   Wt Readings from Last 3 Encounters:  07/13/22 291 lb 9.6 oz (132.3 kg)  11/30/21 299 lb 6.4 oz (135.8 kg)  03/13/21 (!) 336 lb (152.4 kg)   SpO2 Readings from Last 3 Encounters:  07/13/22 97%  11/30/21 98%  04/11/20 98%      Physical Exam Constitutional:      Appearance: She is well-developed.  HENT:     Head: Normocephalic and atraumatic.  Eyes:     Conjunctiva/sclera: Conjunctivae normal.  Neck:     Thyroid: No thyromegaly.     Vascular: No carotid bruit or JVD.  Cardiovascular:     Rate and Rhythm: Normal rate and regular rhythm.     Heart sounds: Normal heart sounds. No murmur heard. Pulmonary:     Effort: Pulmonary effort is normal. No respiratory distress.     Breath sounds: Normal breath  sounds. No wheezing or rales.  Chest:     Chest wall: No tenderness.  Musculoskeletal:        General: Swelling and tenderness present.     Right elbow: Decreased range of motion. Tenderness present in lateral epicondyle.     Left elbow: Normal.     Cervical back: Normal range of motion and neck supple.     Right  lower leg: Edema present.     Left lower leg: Edema present.  Neurological:     Mental Status: She is alert and oriented to person, place, and time.      No results found for any visits on 07/13/22.  Last CBC Lab Results  Component Value Date   WBC 8.3 11/30/2021   HGB 14.3 11/30/2021   HCT 42.0 11/30/2021   MCV 94.9 11/30/2021   MCH 31.3 02/03/2016   RDW 12.9 11/30/2021   PLT 178.0 11/30/2021   Last metabolic panel Lab Results  Component Value Date   GLUCOSE 133 (H) 11/30/2021   NA 135 11/30/2021   K 4.0 11/30/2021   CL 102 11/30/2021   CO2 25 11/30/2021   BUN 13 11/30/2021   CREATININE 0.61 11/30/2021   GFRNONAA >60 02/01/2016   CALCIUM 9.3 11/30/2021   PROT 7.0 11/30/2021   ALBUMIN 4.2 11/30/2021   BILITOT 0.4 11/30/2021   ALKPHOS 76 11/30/2021   AST 22 11/30/2021   ALT 25 11/30/2021   ANIONGAP 7 02/01/2016   Last lipids Lab Results  Component Value Date   CHOL 131 11/30/2021   HDL 39.70 11/30/2021   LDLCALC 70 11/30/2021   LDLDIRECT 144.0 03/13/2021   TRIG 110.0 11/30/2021   CHOLHDL 3 11/30/2021   Last hemoglobin A1c Lab Results  Component Value Date   HGBA1C 8.0 (H) 11/30/2021   Last thyroid functions Lab Results  Component Value Date   TSH 0.03 Repeated and verified X2. (L) 11/30/2021   T4TOTAL 13.4 (H) 03/13/2021   Last vitamin D Lab Results  Component Value Date   VD25OH 25.15 (L) 03/13/2021   Last vitamin B12 and Folate Lab Results  Component Value Date   VITAMINB12 982 (H) 03/13/2021      The 10-year ASCVD risk score (Arnett DK, et al., 2019) is: 21.1%    Assessment & Plan:   Problem List Items Addressed This  Visit       Unprioritized   Type 2 diabetes mellitus with hyperglycemia, without long-term current use of insulin (HCC)   Hypothyroidism   Lateral epicondylitis of right elbow - Primary    Tennis elbow strap Pred taper  Return to office if no improvement        Relevant Medications   predniSONE (DELTASONE) 10 MG tablet   Hyperlipidemia    Encourage heart healthy diet such as MIND or DASH diet, increase exercise, avoid trans fats, simple carbohydrates and processed foods, consider a krill or fish or flaxseed oil cap daily.        Essential hypertension    Well controlled, no changes to meds. Encouraged heart healthy diet such as the DASH diet and exercise as tolerated.       Assessment and Plan    Lateral Epicondylitis (Tennis Elbow): Pain localized to the lateral elbow, exacerbated by repetitive mousing activity. No prior treatment. -Start Prednisone, dose to be sent to [facility] on [address]. -Advise to ice the area and consider ergonomic adjustments to work station.  Hyperthyroidism: Recent decrease in thyroid medication due to low TSH (0.03). Patient reports elevated heart rate. -Check thyroid function tests today. -Consider adjusting thyroid medication dose based on lab results.  Lymphedema: Patient reports unilateral leg and hand swelling. No prior treatment. -Consider lymphatic pump or lymphatic massage. -Advise patient to research these options.  General Health Maintenance / Followup Plans -Followup after lab results are available.        Return if symptoms worsen or fail to improve.  Ann Held, DO

## 2022-07-13 NOTE — Patient Instructions (Signed)
Tennis Elbow  Tennis elbow (lateral epicondylitis) is inflammation of tendons in your outer forearm, near your elbow. Tendons are tissues that connect muscle to bone. When you have tennis elbow, inflammation affects the tendons that you use to bend your wrist and move your hand up. Inflammation occurs in the lower part of the upper arm bone (humerus), where the tendons connect to the bone (lateral epicondyle). Tennis elbow often affects people who play tennis, but anyone may get the condition from repeatedly extending the wrist or turning the forearm. What are the causes? This condition is usually caused by repeatedly extending the wrist, turning the forearm, and using the hands. It can result from sports or work that requires repetitive forearm movements. In some cases, it may be caused by a sudden injury. What increases the risk? You are more likely to develop tennis elbow if you play tennis or another racket sport. You also have a higher risk if you frequently use your hands for work. Besides people who play tennis, others at greater risk include: People who use computers. Construction workers. People who work in factories. Musicians. Cooks. Cashiers. What are the signs or symptoms? Symptoms of this condition include: Pain and tenderness in the forearm and the outer part of the elbow. Pain may be felt only when using the arm, or it may be there all the time. A burning feeling that starts in the elbow and spreads down the forearm. A weak grip in the hand. How is this diagnosed? This condition is diagnosed based on your symptoms, your medical history, and a physical exam. You may also have X-rays or an MRI to: Confirm the diagnosis. Look for other issues. Check for tears in the ligaments, muscles, or tendons. How is this treated? Resting and icing your arm is often the first treatment. Your health care provider may also recommend: Medicines to reduce pain and inflammation. These may be in  the form of a pill, topical gels, or shots of a steroid medicine (cortisone). An elbow strap to reduce stress on the area. Physical therapy. This may include massage or exercises or both. An elbow brace to restrict the movements that cause symptoms. If these treatments do not help relieve your symptoms, your health care provider may recommend surgery to remove damaged muscle and reattach healthy muscle to bone. Follow these instructions at home: If you have a brace or strap: Wear the brace or strap as told by your health care provider. Remove it only as told by your health care provider. Check the skin around the brace or strap every day. Tell your health care provider about any concerns. Loosen the brace if your fingers tingle, become numb, or turn cold and blue. Keep the brace clean. If the brace or strap is not waterproof: Do not let it get wet. Cover it with a watertight covering when you take a bath or a shower. Managing pain, stiffness, and swelling  If directed, put ice on the injured area. To do this: If you have a removable brace or strap, remove it as told by your health care provider. Put ice in a plastic bag. Place a towel between your skin and the bag. Leave the ice on for 20 minutes, 2-3 times a day. Remove the ice if your skin turns bright red. This is very important. If you cannot feel pain, heat, or cold, you have a greater risk of damage to the area. Move your fingers often to reduce stiffness and swelling. Activity Rest your elbow   and wrist and avoid activities that cause symptoms as told by your health care provider. Do physical therapy exercises as told by your health care provider. If you lift an object, lift it with your palm facing up. This reduces stress on your elbow. Lifestyle If your tennis elbow is caused by sports, check your equipment and make sure that: You use it correctly. It is good match for you. If your tennis elbow is caused by work or computer  use, take frequent breaks to stretch your arm. Talk with your employer about ways to manage your condition at work. General instructions Take over-the-counter and prescription medicines only as told by your health care provider. Do not use any products that contain nicotine or tobacco. These products include cigarettes, chewing tobacco, and vaping devices, such as e-cigarettes. If you need help quitting, ask your health care provider. Keep all follow-up visits. This is important. How is this prevented? Before and after activity: Warm up and stretch before being active. Cool down and stretch after being active. Give your body time to rest between periods of activity. During activity: Make sure to use equipment that fits you. If you play tennis, put power in your stroke with your lower body. Avoid using your arm only. Maintain physical fitness, including: Strength. Flexibility. Endurance. Do exercises to strengthen the forearm muscles. Contact a health care provider if: You have pain that gets worse or does not get better with treatment. You have numbness or weakness in your forearm, hand, or fingers. Get help right away if: Your pain is severe. You cannot move your wrist. Summary Tennis elbow (lateral epicondylitis) is inflammation of tendons in your outer forearm, near your elbow. Common symptoms include pain and tenderness in your forearm and the outer part of your elbow. This condition is usually caused by repeatedly extending your wrist, turning your forearm, and using your hands. The first treatment is often resting and icing your arm to relieve symptoms. Further treatment may include taking medicine, getting physical therapy, wearing a brace or strap, or having surgery. This information is not intended to replace advice given to you by your health care provider. Make sure you discuss any questions you have with your health care provider. Document Revised: 07/21/2019 Document  Reviewed: 07/21/2019 Elsevier Patient Education  2024 Elsevier Inc.  

## 2022-07-18 ENCOUNTER — Encounter: Payer: Self-pay | Admitting: Family Medicine

## 2022-07-19 ENCOUNTER — Other Ambulatory Visit: Payer: Self-pay | Admitting: Family Medicine

## 2022-07-19 DIAGNOSIS — E039 Hypothyroidism, unspecified: Secondary | ICD-10-CM

## 2022-07-19 MED ORDER — LEVOTHYROXINE SODIUM 200 MCG PO TABS
200.0000 ug | ORAL_TABLET | Freq: Every day | ORAL | 3 refills | Status: DC
Start: 2022-07-19 — End: 2022-07-19

## 2022-07-19 MED ORDER — LEVOTHYROXINE SODIUM 125 MCG PO TABS: 125.0000 ug | ORAL_TABLET | Freq: Every day | ORAL | 2 refills | Status: DC

## 2022-07-19 NOTE — Telephone Encounter (Signed)
Pt also called stating that she was confused as to her dosage being adjusted as she was under the impression her dosage should be lowered not raised. Please Advise.

## 2022-07-19 NOTE — Addendum Note (Signed)
Addended by: Seabron Spates R on: 07/19/2022 03:43 PM   Modules accepted: Orders

## 2022-09-06 ENCOUNTER — Ambulatory Visit: Payer: PRIVATE HEALTH INSURANCE | Admitting: Family Medicine

## 2022-09-07 ENCOUNTER — Ambulatory Visit: Payer: PRIVATE HEALTH INSURANCE | Admitting: Family Medicine

## 2022-09-07 VITALS — BP 130/80 | HR 109 | Temp 98.2°F | Resp 20 | Ht 68.0 in | Wt 294.4 lb

## 2022-09-07 DIAGNOSIS — E1165 Type 2 diabetes mellitus with hyperglycemia: Secondary | ICD-10-CM

## 2022-09-07 DIAGNOSIS — E785 Hyperlipidemia, unspecified: Secondary | ICD-10-CM

## 2022-09-07 DIAGNOSIS — I10 Essential (primary) hypertension: Secondary | ICD-10-CM

## 2022-09-07 DIAGNOSIS — E039 Hypothyroidism, unspecified: Secondary | ICD-10-CM | POA: Diagnosis not present

## 2022-09-07 DIAGNOSIS — I890 Lymphedema, not elsewhere classified: Secondary | ICD-10-CM

## 2022-09-07 LAB — CBC WITH DIFFERENTIAL/PLATELET
Basophils Absolute: 0.1 10*3/uL (ref 0.0–0.1)
Basophils Relative: 1.1 % (ref 0.0–3.0)
Eosinophils Absolute: 0.2 10*3/uL (ref 0.0–0.7)
Eosinophils Relative: 2.5 % (ref 0.0–5.0)
HCT: 45.4 % (ref 36.0–46.0)
Hemoglobin: 14.9 g/dL (ref 12.0–15.0)
Lymphocytes Relative: 33.7 % (ref 12.0–46.0)
Lymphs Abs: 2.4 10*3/uL (ref 0.7–4.0)
MCHC: 32.8 g/dL (ref 30.0–36.0)
MCV: 96.3 fl (ref 78.0–100.0)
Monocytes Absolute: 0.5 10*3/uL (ref 0.1–1.0)
Monocytes Relative: 6.5 % (ref 3.0–12.0)
Neutro Abs: 4 10*3/uL (ref 1.4–7.7)
Neutrophils Relative %: 56.2 % (ref 43.0–77.0)
Platelets: 175 10*3/uL (ref 150.0–400.0)
RBC: 4.71 Mil/uL (ref 3.87–5.11)
RDW: 13.2 % (ref 11.5–15.5)
WBC: 7.1 10*3/uL (ref 4.0–10.5)

## 2022-09-07 LAB — COMPREHENSIVE METABOLIC PANEL
ALT: 32 U/L (ref 0–35)
AST: 25 U/L (ref 0–37)
Albumin: 4.3 g/dL (ref 3.5–5.2)
Alkaline Phosphatase: 76 U/L (ref 39–117)
BUN: 12 mg/dL (ref 6–23)
CO2: 25 meq/L (ref 19–32)
Calcium: 9.7 mg/dL (ref 8.4–10.5)
Chloride: 101 meq/L (ref 96–112)
Creatinine, Ser: 0.62 mg/dL (ref 0.40–1.20)
GFR: 95.3 mL/min (ref >60.00)
Glucose, Bld: 170 mg/dL — ABNORMAL HIGH (ref 70–99)
Potassium: 4 meq/L (ref 3.5–5.1)
Sodium: 132 meq/L — ABNORMAL LOW (ref 135–145)
Total Bilirubin: 0.5 mg/dL (ref 0.2–1.2)
Total Protein: 7 g/dL (ref 6.0–8.3)

## 2022-09-07 LAB — LIPID PANEL
Cholesterol: 146 mg/dL (ref 0–200)
HDL: 39.8 mg/dL (ref >39.00)
LDL Cholesterol: 76 mg/dL (ref 0–99)
NonHDL: 105.97
Total CHOL/HDL Ratio: 4
Triglycerides: 151 mg/dL — ABNORMAL HIGH (ref 0.0–149.0)
VLDL: 30.2 mg/dL (ref 0.0–40.0)

## 2022-09-07 LAB — TSH: TSH: 1.64 u[IU]/mL (ref 0.35–5.50)

## 2022-09-07 NOTE — Assessment & Plan Note (Signed)
hgba1c to be checked, minimize simple carbs. Increase exercise as tolerated. Continue current meds  

## 2022-09-07 NOTE — Assessment & Plan Note (Signed)
Well controlled, no changes to meds. Encouraged heart healthy diet such as the DASH diet and exercise as tolerated.  °

## 2022-09-07 NOTE — Progress Notes (Signed)
Established Patient Office Visit  Subjective   Patient ID: Kristin Hubbard, female    DOB: Jun 05, 1960  Age: 62 y.o. MRN: [id number]  Chief Complaint  Patient presents with   Forms    HPI Discussed the use of AI scribe software for clinical note transcription with the patient, who gave verbal consent to proceed.  History of Present Illness   The patient, with a history of hypertension, hyperlipidemia, and diabetes, presents for a routine check-up. She reports feeling well overall. She mentions a long-standing subcutaneous lump on her back, which has not changed in size or character over the past ten years. The patient also mentions a history of knee issues and back muscle weakness, which she manages with the use of a motorized cart for shopping. She also reports a history of thyroid issues, which have been managed with medication adjustments. The patient is currently on a dose of of thyroid medication, which she reports has improved her sleep and appetite.  The patient also discusses her personal life, including plans to foster a child and the upcoming move of her sister, who has a history of alcohol abuse. She expresses relief at her sister's departure due to the stress and disruption caused by her behavior.      Patient Active Problem List   Diagnosis Date Noted   Lateral epicondylitis of right elbow 07/13/2022   Depression, major, single episode, moderate (HCC) 11/30/2021   Type 2 diabetes mellitus with hyperglycemia, without long-term current use of insulin (HCC) 03/13/2021   Hair loss 04/11/2020   Left hip pain 04/21/2019   Chronic right shoulder pain 04/21/2019   Left flank pain 09/18/2018   Left lower quadrant abdominal pain 02/10/2018   Hyperglycemia 02/10/2018   Tobacco abuse 02/10/2018   S/P total knee arthroplasty, bilateral 01/31/2016   Obesity (BMI 30-39.9) 01/23/2013   Hyperlipidemia 10/31/2011   OTHER ACUTE REACTIONS TO STRESS 05/05/2009   FATIGUE 07/22/2007    Morbid obesity (HCC) 08/23/2006   Hypothyroidism 03/12/2006   Essential hypertension 03/12/2006   Past Medical History:  Diagnosis Date   Anxiety    Bilateral primary osteoarthritis of knee 01/31/2016   Complication of anesthesia    after 1 st surgery was combative,other surgeries have been fine   Hypertension    Hypothyroidism    Migraine    Thyroid disease    Hypothyroidism   Past Surgical History:  Procedure Laterality Date   BUNIONECTOMY     hammer toe   CHOLECYSTECTOMY     KNEE SURGERY  05/2007   left   TOTAL KNEE ARTHROPLASTY Bilateral 01/31/2016   Procedure: TOTAL KNEE BILATERAL;  Surgeon: Teryl Lucy, MD;  Location: [facility] OR;  Service: Orthopedics;  Laterality: Bilateral;   Social History   Tobacco Use   Smoking status: Every Day    Current packs/day: 1.00    Average packs/day: 1 pack/day for 17.0 years (17.0 ttl pk-yrs)    Types: Cigarettes   Smokeless tobacco: Never   Tobacco comments:    she said when she loses 100 lbs she will quit smoking  Substance Use Topics   Alcohol use: No   Drug use: No   Social History   Socioeconomic History   Marital status: Single    Spouse name: Not on file   Number of children: Not on file   Years of education: Not on file   Highest education level: Associate degree: academic program  Occupational History   Occupation: Database administrator: [employer]  Tobacco Use   Smoking status: Every Day    Current packs/day: 1.00    Average packs/day: 1 pack/day for 17.0 years (17.0 ttl pk-yrs)    Types: Cigarettes   Smokeless tobacco: Never   Tobacco comments:    she said when she loses 100 lbs she will quit smoking  Substance and Sexual Activity   Alcohol use: No   Drug use: No   Sexual activity: Not Currently    Partners: Male  Other Topics Concern   Not on file  Social History Narrative   Exercise--  Trainer and gym   Social Determinants of Health   Financial Resource Strain: Low Risk  (09/07/2022)   Overall  Financial Resource Strain (CARDIA)    Difficulty of Paying Living Expenses: Not hard at all  Food Insecurity: No Food Insecurity (09/07/2022)   Hunger Vital Sign    Worried About Running Out of Food in the Last Year: Never true    Ran Out of Food in the Last Year: Never true  Transportation Needs: No Transportation Needs (09/07/2022)   PRAPARE - Administrator, Civil Service (Medical): No    Lack of Transportation (Non-Medical): No  Physical Activity: Sufficiently Active (09/07/2022)   Exercise Vital Sign    Days of Exercise per Week: 4 days    Minutes of Exercise per Session: 60 min  Stress: No Stress Concern Present (09/07/2022)   [employer] of Occupational Health - Occupational Stress Questionnaire    Feeling of Stress : Only a little  Social Connections: Moderately Integrated (09/07/2022)   Social Connection and Isolation Panel [NHANES]    Frequency of Communication with Friends and Family: More than three times a week    Frequency of Social Gatherings with Friends and Family: Once a week    Attends Religious Services: More than 4 times per year    Active Member of [employer] or Organizations: Yes    Attends Engineer, structural: More than 4 times per year    Marital Status: Never married  Catering manager Violence: Not on file   Family Status  Relation Name Status   Other  (Not Specified)   Father  Deceased   Mother  Deceased  No partnership data on file   Family History  Problem Relation Age of Onset   Diabetes Other    Alcohol abuse Father    Diabetes Father    Heart attack Father    Alcohol abuse Mother    Kidney disease Mother    Allergies  Allergen Reactions   Lisinopril Cough   Vicodin [Hydrocodone-Acetaminophen]     Pt stated, "makes me constipated"      Review of Systems  Constitutional:  Negative for chills, fever and malaise/fatigue.  HENT:  Negative for congestion and hearing loss.   Eyes:  Negative for blurred vision and  discharge.  Respiratory:  Negative for cough, sputum production and shortness of breath.   Cardiovascular:  Negative for chest pain, palpitations and leg swelling.  Gastrointestinal:  Negative for abdominal pain, blood in stool, constipation, diarrhea, heartburn, nausea and vomiting.  Genitourinary:  Negative for dysuria, frequency, hematuria and urgency.  Musculoskeletal:  Negative for back pain, falls and myalgias.  Skin:  Negative for rash.  Neurological:  Negative for dizziness, sensory change, loss of consciousness, weakness and headaches.  Endo/Heme/Allergies:  Negative for environmental allergies. Does not bruise/bleed easily.  Psychiatric/Behavioral:  Negative for depression and suicidal ideas. The patient is not nervous/anxious and does  not have insomnia.       Objective:     BP 130/80 (BP Location: Left Arm, Patient Position: Sitting, Cuff Size: Large)   Pulse (!) 109   Temp 98.2 F (36.8 C) (Oral)   Resp 20   Ht 5\' 8"  (1.727 m)   Wt 294 lb 6.4 oz (133.5 kg)   SpO2 98%   BMI 44.76 kg/m  BP Readings from Last 3 Encounters:  09/07/22 130/80  07/13/22 (!) 140/60  11/30/21 (!) 156/58   Wt Readings from Last 3 Encounters:  09/07/22 294 lb 6.4 oz (133.5 kg)  07/13/22 291 lb 9.6 oz (132.3 kg)  11/30/21 299 lb 6.4 oz (135.8 kg)   SpO2 Readings from Last 3 Encounters:  09/07/22 98%  07/13/22 97%  11/30/21 98%      Physical Exam Vitals and nursing note reviewed.  Constitutional:      General: She is not in acute distress.    Appearance: Normal appearance. She is well-developed.  HENT:     Head: Normocephalic and atraumatic.     Right Ear: Tympanic membrane, ear canal and external ear normal. There is no impacted cerumen.     Left Ear: Tympanic membrane, ear canal and external ear normal. There is no impacted cerumen.     Nose: Nose normal.     Mouth/Throat:     Mouth: Mucous membranes are moist.     Pharynx: Oropharynx is clear. No oropharyngeal exudate or  posterior oropharyngeal erythema.  Eyes:     General: No scleral icterus.       Right eye: No discharge.        Left eye: No discharge.     Conjunctiva/sclera: Conjunctivae normal.     Pupils: Pupils are equal, round, and reactive to light.  Neck:     Thyroid: No thyromegaly or thyroid tenderness.     Vascular: No JVD.  Cardiovascular:     Rate and Rhythm: Normal rate and regular rhythm.     Heart sounds: Normal heart sounds. No murmur heard. Pulmonary:     Effort: Pulmonary effort is normal. No respiratory distress.     Breath sounds: Normal breath sounds.  Abdominal:     General: Bowel sounds are normal. There is no distension.     Palpations: Abdomen is soft. There is no mass.     Tenderness: There is no abdominal tenderness. There is no guarding or rebound.  Genitourinary:    Vagina: Normal.  Musculoskeletal:        General: Normal range of motion.     Cervical back: Normal range of motion and neck supple.     Right lower leg: No edema.     Left lower leg: No edema.  Lymphadenopathy:     Cervical: No cervical adenopathy.  Skin:    General: Skin is warm and dry.     Findings: No erythema or rash.  Neurological:     Mental Status: She is alert and oriented to person, place, and time.     Cranial Nerves: No cranial nerve deficit.     Deep Tendon Reflexes: Reflexes are normal and symmetric.  Psychiatric:        Mood and Affect: Mood normal.        Behavior: Behavior normal.        Thought Content: Thought content normal.        Judgment: Judgment normal.      No results found for any visits on 09/07/22.  Last CBC  Lab Results  Component Value Date   WBC 8.3 11/30/2021   HGB 14.3 11/30/2021   HCT 42.0 11/30/2021   MCV 94.9 11/30/2021   MCH 31.3 02/03/2016   RDW 12.9 11/30/2021   PLT 178.0 11/30/2021   Last metabolic panel Lab Results  Component Value Date   GLUCOSE 154 (H) 07/13/2022   NA 136 07/13/2022   K 4.1 07/13/2022   CL 102 07/13/2022   CO2 25  07/13/2022   BUN 12 07/13/2022   CREATININE 0.65 07/13/2022   GFR 94.32 07/13/2022   CALCIUM 9.3 07/13/2022   PROT 7.2 07/13/2022   ALBUMIN 4.2 07/13/2022   BILITOT 0.5 07/13/2022   ALKPHOS 78 07/13/2022   AST 24 07/13/2022   ALT 29 07/13/2022   ANIONGAP 7 02/01/2016   Last lipids Lab Results  Component Value Date   CHOL 143 07/13/2022   HDL 39.50 07/13/2022   LDLCALC 71 07/13/2022   LDLDIRECT 144.0 03/13/2021   TRIG 164.0 (H) 07/13/2022   CHOLHDL 4 07/13/2022   Last hemoglobin A1c Lab Results  Component Value Date   HGBA1C 7.7 (H) 07/13/2022   Last thyroid functions Lab Results  Component Value Date   TSH 0.08 (L) 07/13/2022   T4TOTAL 13.4 (H) 03/13/2021   Last vitamin D Lab Results  Component Value Date   VD25OH 25.15 (L) 03/13/2021   Last vitamin B12 and Folate Lab Results  Component Value Date   VITAMINB12 982 (H) 03/13/2021      The 10-year ASCVD risk score (Arnett DK, et al., 2019) is: 19.5%    Assessment & Plan:   Problem List Items Addressed This Visit       Unprioritized   Morbid obesity (HCC)   Type 2 diabetes mellitus with hyperglycemia, without long-term current use of insulin (HCC)    hgba1c to be checked,  minimize simple carbs. Increase exercise as tolerated. Continue current meds       Relevant Orders   Comprehensive metabolic panel   Hemoglobin A1c   Microalbumin / creatinine urine ratio   Hypothyroidism - Primary    Check tsh today      Relevant Orders   TSH   Hyperlipidemia    Tolerating statin, encouraged heart healthy diet, avoid trans fats, minimize simple carbs and saturated fats. Increase exercise as tolerated       Relevant Orders   Lipid panel   Comprehensive metabolic panel   Essential hypertension    Well controlled, no changes to meds. Encouraged heart healthy diet such as the DASH diet and exercise as tolerated.        Relevant Orders   CBC with Differential/Platelet   Other Visit Diagnoses      Lymphedema       Relevant Orders   Ambulatory referral to Physical Therapy     Assessment and Plan    Hypothyroidism Patient reports improved sleep and appetite since reducing Levothyroxine to . -Order thyroid function tests today to assess current levels.  Lipoma Patient reports a longstanding, soft, mobile subcutaneous mass on her back, likely a lipoma. No change in size or symptoms. -No intervention needed at this time. Advise patient to monitor for changes in size or symptoms.  Lymphedema Patient expresses interest in physical therapy for lymphedema management. -Explore insurance coverage for physical therapy for lymphedema and provide referral if covered.  General Health Maintenance -Continue current management of hypertension, hyperlipidemia, and diabetes.        No follow-ups on file.    Myrene Buddy  R Zola Button, DO

## 2022-09-07 NOTE — Assessment & Plan Note (Signed)
Tolerating statin, encouraged heart healthy diet, avoid trans fats, minimize simple carbs and saturated fats. Increase exercise as tolerated 

## 2022-09-07 NOTE — Assessment & Plan Note (Signed)
Check tsh today 

## 2022-09-10 ENCOUNTER — Encounter: Payer: Self-pay | Admitting: Family Medicine

## 2022-09-10 DIAGNOSIS — Z111 Encounter for screening for respiratory tuberculosis: Secondary | ICD-10-CM

## 2022-09-10 LAB — HEMOGLOBIN A1C: Hgb A1c MFr Bld: 8.2 % — ABNORMAL HIGH (ref 4.6–6.5)

## 2022-09-11 ENCOUNTER — Encounter: Payer: Self-pay | Admitting: Family Medicine

## 2022-09-12 MED ORDER — MOUNJARO 2.5 MG/0.5ML ~~LOC~~ SOAJ: 2.5000 mg | SUBCUTANEOUS | 0 refills | Status: DC

## 2022-09-14 ENCOUNTER — Other Ambulatory Visit: Payer: PRIVATE HEALTH INSURANCE

## 2022-09-14 ENCOUNTER — Other Ambulatory Visit: Payer: Self-pay | Admitting: *Deleted

## 2022-09-14 MED ORDER — ACCU-CHEK GUIDE VI STRP: ORAL_STRIP | 12 refills | Status: DC

## 2022-10-08 ENCOUNTER — Other Ambulatory Visit: Payer: Self-pay | Admitting: Family Medicine

## 2022-10-10 ENCOUNTER — Other Ambulatory Visit: Payer: Self-pay | Admitting: Family

## 2022-10-10 ENCOUNTER — Encounter: Payer: Self-pay | Admitting: Family Medicine

## 2022-10-10 MED ORDER — TIRZEPATIDE 5 MG/0.5ML ~~LOC~~ SOAJ: 5.0000 mg | SUBCUTANEOUS | 0 refills | Status: DC

## 2022-10-18 ENCOUNTER — Other Ambulatory Visit: Payer: Self-pay | Admitting: Family Medicine

## 2022-11-08 ENCOUNTER — Ambulatory Visit: Payer: PRIVATE HEALTH INSURANCE | Admitting: Family Medicine

## 2022-11-08 VITALS — BP 148/88 | HR 111 | Temp 98.2°F | Resp 18 | Ht 68.0 in | Wt 287.4 lb

## 2022-11-08 DIAGNOSIS — E1165 Type 2 diabetes mellitus with hyperglycemia: Secondary | ICD-10-CM | POA: Diagnosis not present

## 2022-11-08 DIAGNOSIS — E039 Hypothyroidism, unspecified: Secondary | ICD-10-CM

## 2022-11-08 DIAGNOSIS — Z7984 Long term (current) use of oral hypoglycemic drugs: Secondary | ICD-10-CM | POA: Diagnosis not present

## 2022-11-08 DIAGNOSIS — R1012 Left upper quadrant pain: Secondary | ICD-10-CM

## 2022-11-08 LAB — POC URINALSYSI DIPSTICK (AUTOMATED)
Blood, UA: NEGATIVE
Glucose, UA: NEGATIVE
Leukocytes, UA: NEGATIVE
Nitrite, UA: NEGATIVE
Protein, UA: NEGATIVE
Spec Grav, UA: 1.02 (ref 1.010–1.025)
Urobilinogen, UA: 0.2 U/dL
pH, UA: 5 (ref 5.0–8.0)

## 2022-11-08 MED ORDER — TIRZEPATIDE 5 MG/0.5ML ~~LOC~~ SOAJ
5.0000 mg | SUBCUTANEOUS | 0 refills | Status: DC
Start: 2022-11-08 — End: 2023-09-20

## 2022-11-08 NOTE — Progress Notes (Signed)
Established Patient Office Visit  Subjective   Patient ID: Kristin Hubbard, female    DOB: 08-28-1960  Age: 62 y.o. MRN: [id number]  Chief Complaint  Patient presents with   Abdominal Pain    Pt stated having left side flank pain. Pt states pain started last night.     HPI Discussed the use of AI scribe software for clinical note transcription with the patient, who gave verbal consent to proceed.  History of Present Illness   The patient, with a history of pancreatitis, presents with recurrent side  pain similar to her previous episode of pancreatitis. The pain is located in the same area as before and is not constant, but is concerning to the patient due to its similarity to her previous episode. The patient reports that the pain started after her dose of medication was increased to 5mg , and she has been experiencing side effects such as loss of appetite, heartburn, and constipation. The patient also reports feeling more tired than usual, which she attributes to the medication and the shorter days. She has lost weight  without trying, which she attributes to her lack of appetite. The patient also reports having lymphedema in her feet, which was discovered during a therapy session.      Patient Active Problem List   Diagnosis Date Noted   Lateral epicondylitis of right elbow 07/13/2022   Depression, major, single episode, moderate (HCC) 11/30/2021   Type 2 diabetes mellitus with hyperglycemia, without long-term current use of insulin (HCC) 03/13/2021   Hair loss 04/11/2020   Left hip pain 04/21/2019   Chronic right shoulder pain 04/21/2019   Left flank pain 09/18/2018   Left lower quadrant abdominal pain 02/10/2018   Hyperglycemia 02/10/2018   Tobacco abuse 02/10/2018   S/P total knee arthroplasty, bilateral 01/31/2016   Obesity (BMI 30-39.9) 01/23/2013   Hyperlipidemia 10/31/2011   OTHER ACUTE REACTIONS TO STRESS 05/05/2009   FATIGUE 07/22/2007   Morbid obesity (HCC) 08/23/2006   Hypothyroidism 03/12/2006   Essential hypertension 03/12/2006   Past Medical History:  Diagnosis Date   Anxiety    Bilateral primary osteoarthritis of knee 01/31/2016   Complication of anesthesia    after 1 st surgery was combative,other surgeries have been fine   Hypertension    Hypothyroidism    Migraine    Thyroid disease    Hypothyroidism   Past Surgical History:  Procedure Laterality Date   BUNIONECTOMY     hammer toe   CHOLECYSTECTOMY     KNEE SURGERY  05/2007   left   TOTAL KNEE ARTHROPLASTY Bilateral 01/31/2016   Procedure: TOTAL KNEE BILATERAL;  Surgeon: Teryl Lucy, MD;  Location: [facility] OR;  Service: Orthopedics;  Laterality: Bilateral;   Social History   Tobacco Use   Smoking status: Every Day    Current packs/day: 1.00    Average packs/day: 1 pack/day for 17.0 years (17.0 ttl pk-yrs)    Types: Cigarettes   Smokeless tobacco: Never   Tobacco comments:    she said when she loses 100 lbs she will quit smoking  Substance Use Topics   Alcohol use: No   Drug use: No   Social History    Socioeconomic History   Marital status: Single    Spouse name: Not on file   Number of children: Not on file   Years of education: Not on file   Highest education level: Associate degree: academic program  Occupational History   Occupation: Database administrator: [employer]  Tobacco Use   Smoking status: Every Day    Current packs/day: 1.00    Average packs/day: 1 pack/day for 17.0 years (17.0 ttl pk-yrs)    Types: Cigarettes   Smokeless tobacco: Never   Tobacco comments:    she said when she loses 100 lbs she will quit smoking  Substance and Sexual Activity   Alcohol use: No   Drug use: No   Sexual activity: Not Currently    Partners: Male  Other Topics Concern   Not on file  Social History Narrative  Exercise--  Trainer and gym   Social Determinants of Health   Financial Resource Strain: Low Risk  (11/08/2022)   Overall Financial Resource Strain (CARDIA)    Difficulty of Paying Living Expenses: Not hard at all  Food Insecurity: No Food Insecurity (11/08/2022)   Hunger Vital Sign    Worried About Running Out of Food in the Last Year: Never true    Ran Out of Food in the Last Year: Never true  Transportation Needs: No Transportation Needs (11/08/2022)   PRAPARE - Administrator, Civil Service (Medical): No    Lack of Transportation (Non-Medical): No  Physical Activity: Sufficiently Active (11/08/2022)   Exercise Vital Sign    Days of Exercise per Week: 3 days    Minutes of Exercise per Session: 50 min  Stress: No Stress Concern Present (11/08/2022)   [employer] of Occupational Health - Occupational Stress Questionnaire    Feeling of Stress : Only a little  Social Connections: Moderately Integrated (11/08/2022)   Social Connection and Isolation Panel [NHANES]    Frequency of Communication with Friends and Family: Twice a week    Frequency of Social Gatherings with Friends and Family: Once a week    Attends Religious Services: More than  4 times per year    Active Member of [employer] or Organizations: Yes    Attends Engineer, structural: More than 4 times per year    Marital Status: Never married  Catering manager Violence: Not on file   Family Status  Relation Name Status   Other  (Not Specified)   Father  Deceased   Mother  Deceased  No partnership data on file   Family History  Problem Relation Age of Onset   Diabetes Other    Alcohol abuse Father    Diabetes Father    Heart attack Father    Alcohol abuse Mother    Kidney disease Mother    Allergies  Allergen Reactions   Lisinopril Cough   Vicodin [Hydrocodone-Acetaminophen]     Pt stated, "makes me constipated"      Review of Systems  Constitutional:  Negative for fever and malaise/fatigue.  HENT:  Negative for congestion.   Eyes:  Negative for blurred vision.  Respiratory:  Negative for shortness of breath.   Cardiovascular:  Negative for chest pain, palpitations and leg swelling.  Gastrointestinal:  Positive for abdominal pain. Negative for blood in stool, constipation, diarrhea and nausea.  Genitourinary:  Negative for dysuria and frequency.  Musculoskeletal:  Negative for falls.  Skin:  Negative for rash.  Neurological:  Negative for dizziness, loss of consciousness and headaches.  Endo/Heme/Allergies:  Negative for environmental allergies.  Psychiatric/Behavioral:  Negative for depression. The patient is not nervous/anxious.       Objective:     BP (!) 148/88 (BP Location: Right Arm, Patient Position: Sitting, Cuff Size: Large)   Pulse (!) 111   Temp 98.2 F (36.8 C) (Oral)   Resp 18   Ht 5\' 8"  (1.727 m)   Wt 287 lb 6.4 oz (130.4 kg)   SpO2 97%   BMI 43.70 kg/m  BP Readings from Last 3 Encounters:  11/08/22 (!) 148/88  09/07/22 130/80  07/13/22 (!) 140/60   Wt Readings from Last 3 Encounters:  11/08/22 287 lb 6.4 oz (130.4 kg)  09/07/22 294 lb 6.4 oz (133.5 kg)  07/13/22 291 lb 9.6 oz (132.3 kg)   SpO2 Readings from  Last 3 Encounters:  11/08/22 97%  09/07/22  98%  07/13/22 97%      Physical Exam Vitals and nursing note reviewed.  Constitutional:      General: She is not in acute distress.    Appearance: Normal appearance. She is well-developed.  HENT:     Head: Normocephalic and atraumatic.  Eyes:     General: No scleral icterus.       Right eye: No discharge.        Left eye: No discharge.  Cardiovascular:     Rate and Rhythm: Normal rate and regular rhythm.     Heart sounds: No murmur heard. Pulmonary:     Effort: Pulmonary effort is normal. No respiratory distress.     Breath sounds: Normal breath sounds.  Abdominal:     General: Bowel sounds are normal. There is no distension.     Palpations: Abdomen is soft.     Tenderness: There is abdominal tenderness in the left upper quadrant. There is left CVA tenderness. There is no guarding or rebound. Negative signs include Murphy's sign, Rovsing's sign, McBurney's sign and psoas sign.  Musculoskeletal:        General: Normal range of motion.     Cervical back: Normal range of motion and neck supple.     Right lower leg: No edema.     Left lower leg: No edema.  Skin:    General: Skin is warm and dry.  Neurological:     Mental Status: She is alert and oriented to person, place, and time.  Psychiatric:        Mood and Affect: Mood normal.        Behavior: Behavior normal.        Thought Content: Thought content normal.        Judgment: Judgment normal.      No results found for any visits on 11/08/22.  Last CBC Lab Results  Component Value Date   WBC 7.1 09/07/2022   HGB 14.9 09/07/2022   HCT 45.4 09/07/2022   MCV 96.3 09/07/2022   MCH 31.3 02/03/2016   RDW 13.2 09/07/2022   PLT 175.0 09/07/2022   Last metabolic panel Lab Results  Component Value Date   GLUCOSE 170 (H) 09/07/2022   NA 132 (L) 09/07/2022   K 4.0 09/07/2022   CL 101 09/07/2022   CO2 25 09/07/2022   BUN 12 09/07/2022   CREATININE 0.62 09/07/2022   GFR  95.30 09/07/2022   CALCIUM 9.7 09/07/2022   PROT 7.0 09/07/2022   ALBUMIN 4.3 09/07/2022   BILITOT 0.5 09/07/2022   ALKPHOS 76 09/07/2022   AST 25 09/07/2022   ALT 32 09/07/2022   ANIONGAP 7 02/01/2016   Last lipids Lab Results  Component Value Date   CHOL 146 09/07/2022   HDL 39.80 09/07/2022   LDLCALC 76 09/07/2022   LDLDIRECT 144.0 03/13/2021   TRIG 151.0 (H) 09/07/2022   CHOLHDL 4 09/07/2022   Last hemoglobin A1c Lab Results  Component Value Date   HGBA1C 8.2 (H) 09/07/2022   Last thyroid functions Lab Results  Component Value Date   TSH 1.64 09/07/2022   T4TOTAL 13.4 (H) 03/13/2021   Last vitamin D Lab Results  Component Value Date   VD25OH 25.15 (L) 03/13/2021   Last vitamin B12 and Folate Lab Results  Component Value Date   VITAMINB12 982 (H) 03/13/2021      The 10-year ASCVD risk score (Arnett DK, et al., 2019) is: 24.8%    Assessment & Plan:   Problem List Items  Addressed This Visit       Unprioritized   Type 2 diabetes mellitus with hyperglycemia, without long-term current use of insulin (HCC)   Relevant Medications   tirzepatide (MOUNJARO) 5 MG/0.5ML Pen   Other Relevant Orders   Hemoglobin A1c   Hypothyroidism   Relevant Orders   TSH   Other Visit Diagnoses     Left upper quadrant abdominal pain    -  Primary   Relevant Orders   Amylase   Lipase   Comprehensive metabolic panel   CBC with Differential/Platelet   POCT Urinalysis Dipstick (Automated)      Assessment and Plan    Pancreatitis Recurrent abdominal pain similar to previous pancreatitis episode. Currently on 5mg  of Mounjaro, which may be contributing to symptoms. -Order labs to assess for pancreatitis. -If labs are negative and pain persists, consider CT scan. -Continue Mounjaro 5mg  every two weeks.  Gastroesophageal Reflux Disease (GERD) Reports of nightly heartburn. -Advise on lifestyle modifications including dietary changes and elevation of head during  sleep.  Constipation Reports of intermittent constipation. -Advise on increasing fluid intake and dietary fiber.  Lymphedema Identified in feet during therapy. -Consider use of diuretics if condition worsens.  Thyroid function Reports of increased fatigue. -Order thyroid function tests to assess current status.  General Health Maintenance -Continue weight loss efforts (8lbs lost since last visit). -Check labs and urine sample. -Communicate lab results as soon as available to alleviate patient anxiety.       No follow-ups on file.    Donato Schultz, DO

## 2022-11-08 NOTE — Patient Instructions (Signed)
Abdominal Pain, Adult  Pain in the abdomen (abdominal pain) can be caused by many things. In most cases, it gets better with no treatment or by being treated at home. But in some cases, it can be serious. Your health care provider will ask questions about your medical history and do a physical exam to try to figure out what is causing your pain. Follow these instructions at home: Medicines Take over-the-counter and prescription medicines only as told by your provider. Do not take medicines that help you poop (laxatives) unless told by your provider. General instructions Watch your condition for any changes. Drink enough fluid to keep your pee (urine) pale yellow. Contact a health care provider if: Your pain changes, gets worse, or lasts longer than expected. You have severe cramping or bloating in your abdomen, or you vomit. Your pain gets worse with meals, after eating, or with certain foods. You are constipated or have diarrhea for more than 2-3 days. You are not hungry, or you lose weight without trying. You have signs of dehydration. These may include: Dark pee, very little pee, or no pee. Cracked lips or dry mouth. Sleepiness or weakness. You have pain when you pee (urinate) or poop. Your abdominal pain wakes you up at night. You have blood in your pee. You have a fever. Get help right away if: You cannot stop vomiting. Your pain is only in one part of the abdomen. Pain on the right side could be caused by appendicitis. You have bloody or black poop (stool), or poop that looks like tar. You have trouble breathing. You have chest pain. These symptoms may be an emergency. Get help right away. Call 911. Do not wait to see if the symptoms will go away. Do not drive yourself to the hospital. This information is not intended to replace advice given to you by your health care provider. Make sure you discuss any questions you have with your health care provider. Document Revised:  10/25/2021 Document Reviewed: 10/25/2021 Elsevier Patient Education  2024 [employer].

## 2022-11-09 ENCOUNTER — Encounter: Payer: Self-pay | Admitting: Family Medicine

## 2022-11-09 ENCOUNTER — Other Ambulatory Visit: Payer: Self-pay | Admitting: Family Medicine

## 2022-11-09 DIAGNOSIS — K8500 Idiopathic acute pancreatitis without necrosis or infection: Secondary | ICD-10-CM

## 2022-11-09 LAB — CBC WITH DIFFERENTIAL/PLATELET
Basophils Absolute: 0.1 10*3/uL (ref 0.0–0.1)
Basophils Relative: 0.7 % (ref 0.0–3.0)
Eosinophils Absolute: 0.2 10*3/uL (ref 0.0–0.7)
Eosinophils Relative: 1.9 % (ref 0.0–5.0)
HCT: 45 % (ref 36.0–46.0)
Hemoglobin: 15 g/dL (ref 12.0–15.0)
Lymphocytes Relative: 29.9 % (ref 12.0–46.0)
Lymphs Abs: 2.9 10*3/uL (ref 0.7–4.0)
MCHC: 33.3 g/dL (ref 30.0–36.0)
MCV: 96.7 fL (ref 78.0–100.0)
Monocytes Absolute: 0.6 10*3/uL (ref 0.1–1.0)
Monocytes Relative: 6.6 % (ref 3.0–12.0)
Neutro Abs: 5.8 10*3/uL (ref 1.4–7.7)
Neutrophils Relative %: 60.9 % (ref 43.0–77.0)
Platelets: 204 10*3/uL (ref 150.0–400.0)
RBC: 4.65 Mil/uL (ref 3.87–5.11)
RDW: 13.1 % (ref 11.5–15.5)
WBC: 9.6 10*3/uL (ref 4.0–10.5)

## 2022-11-09 LAB — COMPREHENSIVE METABOLIC PANEL
ALT: 31 U/L (ref 0–35)
AST: 25 U/L (ref 0–37)
Albumin: 4.4 g/dL (ref 3.5–5.2)
Alkaline Phosphatase: 71 U/L (ref 39–117)
BUN: 15 mg/dL (ref 6–23)
CO2: 24 meq/L (ref 19–32)
Calcium: 10.5 mg/dL (ref 8.4–10.5)
Chloride: 101 meq/L (ref 96–112)
Creatinine, Ser: 0.78 mg/dL (ref 0.40–1.20)
GFR: 81.19 mL/min (ref >60.00)
Glucose, Bld: 105 mg/dL — ABNORMAL HIGH (ref 70–99)
Potassium: 4.1 meq/L (ref 3.5–5.1)
Sodium: 135 meq/L (ref 135–145)
Total Bilirubin: 0.4 mg/dL (ref 0.2–1.2)
Total Protein: 7.7 g/dL (ref 6.0–8.3)

## 2022-11-09 LAB — HEMOGLOBIN A1C: Hgb A1c MFr Bld: 6.9 % — ABNORMAL HIGH (ref 4.6–6.5)

## 2022-11-09 LAB — AMYLASE: Amylase: 74 U/L (ref 27–131)

## 2022-11-09 LAB — TSH: TSH: 2.18 u[IU]/mL (ref 0.35–5.50)

## 2022-11-09 LAB — LIPASE: Lipase: 200 U/L — ABNORMAL HIGH (ref 11.0–59.0)

## 2022-11-12 ENCOUNTER — Ambulatory Visit: Payer: PRIVATE HEALTH INSURANCE | Admitting: Family Medicine

## 2022-11-29 ENCOUNTER — Encounter: Payer: Self-pay | Admitting: Family Medicine

## 2022-12-30 ENCOUNTER — Other Ambulatory Visit: Payer: Self-pay | Admitting: Family Medicine

## 2022-12-30 DIAGNOSIS — E785 Hyperlipidemia, unspecified: Secondary | ICD-10-CM

## 2022-12-30 DIAGNOSIS — I10 Essential (primary) hypertension: Secondary | ICD-10-CM

## 2023-01-21 ENCOUNTER — Encounter: Payer: Self-pay | Admitting: Family Medicine

## 2023-01-21 ENCOUNTER — Other Ambulatory Visit: Payer: Self-pay | Admitting: Family Medicine

## 2023-01-21 DIAGNOSIS — E039 Hypothyroidism, unspecified: Secondary | ICD-10-CM

## 2023-01-21 DIAGNOSIS — K8500 Idiopathic acute pancreatitis without necrosis or infection: Secondary | ICD-10-CM

## 2023-01-21 DIAGNOSIS — I10 Essential (primary) hypertension: Secondary | ICD-10-CM

## 2023-01-21 DIAGNOSIS — E785 Hyperlipidemia, unspecified: Secondary | ICD-10-CM

## 2023-01-22 ENCOUNTER — Other Ambulatory Visit (INDEPENDENT_AMBULATORY_CARE_PROVIDER_SITE_OTHER): Payer: PRIVATE HEALTH INSURANCE

## 2023-01-22 DIAGNOSIS — K8500 Idiopathic acute pancreatitis without necrosis or infection: Secondary | ICD-10-CM

## 2023-01-22 DIAGNOSIS — I10 Essential (primary) hypertension: Secondary | ICD-10-CM

## 2023-01-22 DIAGNOSIS — E039 Hypothyroidism, unspecified: Secondary | ICD-10-CM

## 2023-01-22 DIAGNOSIS — E785 Hyperlipidemia, unspecified: Secondary | ICD-10-CM

## 2023-01-22 LAB — MICROALBUMIN / CREATININE URINE RATIO
Creatinine,U: 114.8 mg/dL
Microalb Creat Ratio: 1.4 mg/g (ref 0.0–30.0)
Microalb, Ur: 1.6 mg/dL (ref 0.0–1.9)

## 2023-01-22 LAB — LIPID PANEL
Cholesterol: 177 mg/dL (ref 0–200)
HDL: 45.2 mg/dL (ref >39.00)
LDL Cholesterol: 73 mg/dL (ref 0–99)
NonHDL: 131.67
Total CHOL/HDL Ratio: 4
Triglycerides: 292 mg/dL — ABNORMAL HIGH (ref 0.0–149.0)
VLDL: 58.4 mg/dL — ABNORMAL HIGH (ref 0.0–40.0)

## 2023-01-22 LAB — CBC WITH DIFFERENTIAL/PLATELET
Basophils Absolute: 0.1 10*3/uL (ref 0.0–0.1)
Basophils Relative: 1.2 % (ref 0.0–3.0)
Eosinophils Absolute: 0.1 10*3/uL (ref 0.0–0.7)
Eosinophils Relative: 1.4 % (ref 0.0–5.0)
HCT: 43.6 % (ref 36.0–46.0)
Hemoglobin: 14.7 g/dL (ref 12.0–15.0)
Lymphocytes Relative: 26.8 % (ref 12.0–46.0)
Lymphs Abs: 2.2 10*3/uL (ref 0.7–4.0)
MCHC: 33.8 g/dL (ref 30.0–36.0)
MCV: 97.4 fL (ref 78.0–100.0)
Monocytes Absolute: 0.4 10*3/uL (ref 0.1–1.0)
Monocytes Relative: 5.3 % (ref 3.0–12.0)
Neutro Abs: 5.3 10*3/uL (ref 1.4–7.7)
Neutrophils Relative %: 65.3 % (ref 43.0–77.0)
Platelets: 186 10*3/uL (ref 150.0–400.0)
RBC: 4.48 Mil/uL (ref 3.87–5.11)
RDW: 13.5 % (ref 11.5–15.5)
WBC: 8 10*3/uL (ref 4.0–10.5)

## 2023-01-22 LAB — COMPREHENSIVE METABOLIC PANEL
ALT: 23 U/L (ref 0–35)
AST: 19 U/L (ref 0–37)
Albumin: 4.2 g/dL (ref 3.5–5.2)
Alkaline Phosphatase: 77 U/L (ref 39–117)
BUN: 20 mg/dL (ref 6–23)
CO2: 24 meq/L (ref 19–32)
Calcium: 9.4 mg/dL (ref 8.4–10.5)
Chloride: 104 meq/L (ref 96–112)
Creatinine, Ser: 0.72 mg/dL (ref 0.40–1.20)
GFR: 89.24 mL/min (ref >60.00)
Glucose, Bld: 218 mg/dL — ABNORMAL HIGH (ref 70–99)
Potassium: 3.8 meq/L (ref 3.5–5.1)
Sodium: 138 meq/L (ref 135–145)
Total Bilirubin: 0.3 mg/dL (ref 0.2–1.2)
Total Protein: 6.9 g/dL (ref 6.0–8.3)

## 2023-01-22 LAB — AMYLASE: Amylase: 102 U/L (ref 27–131)

## 2023-01-22 LAB — LIPASE: Lipase: 247 U/L — ABNORMAL HIGH (ref 11.0–59.0)

## 2023-01-23 LAB — THYROID PANEL WITH TSH
Free Thyroxine Index: 3.1 (ref 1.4–3.8)
T3 Uptake: 30 % (ref 22–35)
T4, Total: 10.4 ug/dL (ref 5.1–11.9)
TSH: 2.59 m[IU]/L (ref 0.40–4.50)

## 2023-01-24 ENCOUNTER — Encounter: Payer: Self-pay | Admitting: Family Medicine

## 2023-01-24 ENCOUNTER — Other Ambulatory Visit: Payer: Self-pay | Admitting: Family Medicine

## 2023-01-24 DIAGNOSIS — T50905A Adverse effect of unspecified drugs, medicaments and biological substances, initial encounter: Secondary | ICD-10-CM

## 2023-01-31 ENCOUNTER — Encounter: Payer: Self-pay | Admitting: Family Medicine

## 2023-01-31 ENCOUNTER — Telehealth (INDEPENDENT_AMBULATORY_CARE_PROVIDER_SITE_OTHER): Payer: PRIVATE HEALTH INSURANCE | Admitting: Family Medicine

## 2023-01-31 DIAGNOSIS — R748 Abnormal levels of other serum enzymes: Secondary | ICD-10-CM | POA: Diagnosis not present

## 2023-01-31 NOTE — Progress Notes (Signed)
 MyChart Video Visit    Virtual Visit via Video Note  H This patient is at least at moderate risk for complications without adequate follow up. This format is felt to be most appropriate for this patient at this time. Physical exam was limited by quality of the video and audio technology used for the visit. Powell  was able to get the patient set up on a video visit.  Patient location: home Patient and provider in visit Provider location: Office  I discussed the limitations of evaluation and management by telemedicine and the availability of in person appointments. The patient expressed understanding and agreed to proceed.  Visit Date: 01/31/2023  Today's healthcare provider: Jamee JONELLE Antonio Cyndee, DO     Subjective:    Patient ID: Kristin Hubbard, female    DOB: 1960/06/21, 63 y.o.   MRN: [id number]  Chief Complaint  Patient presents with   Abdominal Pain   Follow-up    HPI Patient is in today for f/u labs  Discussed the use of AI scribe software for clinical note transcription with the patient, who gave verbal consent to proceed.  History of Present Illness   The patient, with a history of pancreatitis, presents with concerns about recent blood work results indicating elevated levels. She reports a sensation of discomfort in the stomach, described as a dull, intermittent, non-debilitating sensation. The discomfort is not associated with any specific triggers such as eating or physical activity. The patient denies experiencing any pain.  She has been adhering to a clear liquid diet for the past 24 hours in response to the abnormal blood work results. The patient reports feeling hungry but is willing to continue the diet as needed. She has been off the medication Mounjaro  since the Monday before Thanksgiving and acknowledges a poor diet over the holiday period.  The patient also mentions a previous episode of pancreatitis, which resolved quickly after discontinuing a daily  medication. She expresses concern about the current episode not resolving as quickly, despite discontinuing Mounjaro .  In addition, the patient inquires about the use of Rogaine and its potential interactions with her current medications. She expresses a preference for the pill form over the topical application.  Lastly, the patient mentions a recent change in her living situation, as she is now providing foster care. This is not directly related to her medical concerns but may contribute to her overall stress levels.       Past Medical History:  Diagnosis Date   Anxiety    Bilateral primary osteoarthritis of knee 01/31/2016   Complication of anesthesia    after 1 st surgery was combative,other surgeries have been fine   Hypertension    Hypothyroidism    Migraine    Thyroid  disease    Hypothyroidism    Past Surgical History:  Procedure Laterality Date   BUNIONECTOMY     hammer toe   CHOLECYSTECTOMY     KNEE SURGERY  05/2007   left   TOTAL KNEE ARTHROPLASTY Bilateral 01/31/2016   Procedure: TOTAL KNEE BILATERAL;  Surgeon: Fonda Olmsted, MD;  Location: [facility] OR;  Service: Orthopedics;  Laterality: Bilateral;    Family History  Problem Relation Age of Onset   Diabetes Other    Alcohol abuse Father    Diabetes Father    Heart attack Father    Alcohol abuse Mother    Kidney disease Mother     Social History   Socioeconomic History   Marital status: Single    Spouse  name: Not on file   Number of children: Not on file   Years of education: Not on file   Highest education level: Associate degree: academic program  Occupational History   Occupation: Database Administrator: [employer]  Tobacco Use   Smoking status: Every Day    Current packs/day: 1.00    Average packs/day: 1 pack/day for 17.0 years (17.0 ttl pk-yrs)    Types: Cigarettes   Smokeless tobacco: Never   Tobacco comments:    she said when she loses 100 lbs she will quit smoking  Substance and Sexual  Activity   Alcohol use: No   Drug use: No   Sexual activity: Not Currently    Partners: Male  Other Topics Concern   Not on file  Social History Narrative   Exercise--  Trainer and gym   Social Drivers of Health   Financial Resource Strain: Low Risk  (11/08/2022)   Overall Financial Resource Strain (CARDIA)    Difficulty of Paying Living Expenses: Not hard at all  Food Insecurity: No Food Insecurity (11/08/2022)   Hunger Vital Sign    Worried About Running Out of Food in the Last Year: Never true    Ran Out of Food in the Last Year: Never true  Transportation Needs: No Transportation Needs (11/08/2022)   PRAPARE - Administrator, Civil Service (Medical): No    Lack of Transportation (Non-Medical): No  Physical Activity: Sufficiently Active (11/08/2022)   Exercise Vital Sign    Days of Exercise per Week: 3 days    Minutes of Exercise per Session: 50 min  Stress: No Stress Concern Present (11/08/2022)   [employer] of Occupational Health - Occupational Stress Questionnaire    Feeling of Stress : Only a little  Social Connections: Moderately Integrated (11/08/2022)   Social Connection and Isolation Panel [NHANES]    Frequency of Communication with Friends and Family: Twice a week    Frequency of Social Gatherings with Friends and Family: Once a week    Attends Religious Services: More than 4 times per year    Active Member of [employer] or Organizations: Yes    Attends Engineer, Structural: More than 4 times per year    Marital Status: Never married  Intimate Partner Violence: Not on file    Outpatient Medications Prior to Visit  Medication Sig Dispense Refill   amLODipine  (NORVASC ) 5 MG tablet Take 1 tablet (5 mg total) by mouth daily. 90 tablet 1   blood glucose meter kit and supplies Dispense based on patient and insurance preference. Use up to four times daily as directed. (FOR ICD-10 E10.9, E11.9). 1 each 0   glucose blood (ACCU-CHEK GUIDE) test  strip USE 1 STRIP UP TO 4 TIMES DAILY AS DIRECTED 100 each 12   levothyroxine  (SYNTHROID ) 125 MCG tablet Take 1 tablet (125 mcg total) by mouth daily. 30 tablet 2   metFORMIN  (GLUCOPHAGE -XR) 500 MG 24 hr tablet Take 1,000 mg by mouth daily with breakfast. 180 tablet 1   rosuvastatin  (CRESTOR ) 20 MG tablet Take 1 tablet by mouth at bedtime 90 tablet 1   tirzepatide  (MOUNJARO ) 5 MG/0.5ML Pen Inject 5 mg into the skin once a week. 6 mL 0   No facility-administered medications prior to visit.    Allergies  Allergen Reactions   Lisinopril Cough   Vicodin [Hydrocodone-Acetaminophen ]     Pt stated, makes me constipated    Review of Systems  Constitutional:  Negative for  chills, fever and malaise/fatigue.  HENT:  Negative for congestion and hearing loss.   Eyes:  Negative for blurred vision and discharge.  Respiratory:  Negative for cough, sputum production and shortness of breath.   Cardiovascular:  Negative for chest pain, palpitations and leg swelling.  Gastrointestinal:  Positive for abdominal pain. Negative for blood in stool, constipation, diarrhea, heartburn, nausea and vomiting.  Genitourinary:  Negative for dysuria, frequency, hematuria and urgency.  Musculoskeletal:  Negative for back pain, falls and myalgias.  Skin:  Negative for rash.  Neurological:  Negative for dizziness, sensory change, loss of consciousness, weakness and headaches.  Endo/Heme/Allergies:  Negative for environmental allergies. Does not bruise/bleed easily.  Psychiatric/Behavioral:  Negative for depression and suicidal ideas. The patient is not nervous/anxious and does not have insomnia.        Objective:    Physical Exam Vitals and nursing note reviewed.  Constitutional:      General: She is not in acute distress.    Appearance: She is obese. She is not ill-appearing.  Neurological:     General: No focal deficit present.     Mental Status: She is alert.  Psychiatric:        Mood and Affect: Mood  normal.        Behavior: Behavior normal.     There were no vitals taken for this visit. Wt Readings from Last 3 Encounters:  11/08/22 287 lb 6.4 oz (130.4 kg)  09/07/22 294 lb 6.4 oz (133.5 kg)  07/13/22 291 lb 9.6 oz (132.3 kg)       Assessment & Plan:  Elevated lipase -     CT ABDOMEN PELVIS W CONTRAST; Future -     Amylase; Future -     Lipase; Future -     Comprehensive metabolic panel; Future     I discussed the assessment and treatment plan with the patient. The patient was provided an opportunity to ask questions and all were answered. The patient agreed with the plan and demonstrated an understanding of the instructions.   The patient was advised to call back or seek an in-person evaluation if the symptoms worsen or if the condition fails to improve as anticipated.  Jamee JONELLE Antonio Cyndee, DO [facility] [phone] (phone) [phone] (fax)  [facility]

## 2023-02-28 ENCOUNTER — Telehealth: Payer: Self-pay

## 2023-02-28 NOTE — Telephone Encounter (Signed)
 Spoke with pharmacy. Okay'd switch

## 2023-02-28 NOTE — Telephone Encounter (Signed)
 Copied from CRM [id number]. Topic: Clinical - Prescription Issue >> Feb 28, 2023  9:59 AM Robinson H wrote: Reason for CRM: Pharmacy calling to get permission to change manufactures for patients levothyroxine  (SYNTHROID ) 125 MCG tablet, previous manufacture no longer able to order.  Ray [phone]

## 2023-05-07 ENCOUNTER — Other Ambulatory Visit: Payer: Self-pay | Admitting: Family Medicine

## 2023-07-12 ENCOUNTER — Other Ambulatory Visit: Payer: Self-pay | Admitting: Family Medicine

## 2023-07-12 DIAGNOSIS — I10 Essential (primary) hypertension: Secondary | ICD-10-CM

## 2023-07-12 DIAGNOSIS — E785 Hyperlipidemia, unspecified: Secondary | ICD-10-CM

## 2023-08-16 ENCOUNTER — Encounter: Payer: Self-pay | Admitting: Family Medicine

## 2023-08-16 DIAGNOSIS — I10 Essential (primary) hypertension: Secondary | ICD-10-CM

## 2023-08-16 DIAGNOSIS — E785 Hyperlipidemia, unspecified: Secondary | ICD-10-CM

## 2023-08-16 MED ORDER — ROSUVASTATIN CALCIUM 20 MG PO TABS: 20.0000 mg | ORAL_TABLET | Freq: Every day | ORAL | 0 refills | Status: DC

## 2023-08-16 MED ORDER — LEVOTHYROXINE SODIUM 125 MCG PO TABS: 125.0000 ug | ORAL_TABLET | Freq: Every day | ORAL | 0 refills | Status: DC

## 2023-08-16 MED ORDER — AMLODIPINE BESYLATE 5 MG PO TABS: 5.0000 mg | ORAL_TABLET | Freq: Every day | ORAL | 0 refills | Status: DC

## 2023-09-20 ENCOUNTER — Encounter: Payer: Self-pay | Admitting: Family Medicine

## 2023-09-20 ENCOUNTER — Ambulatory Visit: Payer: PRIVATE HEALTH INSURANCE | Admitting: Family Medicine

## 2023-09-20 VITALS — BP 144/92 | HR 97 | Temp 98.3°F | Resp 18 | Ht 68.0 in | Wt 287.4 lb

## 2023-09-20 DIAGNOSIS — T50905A Adverse effect of unspecified drugs, medicaments and biological substances, initial encounter: Secondary | ICD-10-CM | POA: Diagnosis not present

## 2023-09-20 DIAGNOSIS — I10 Essential (primary) hypertension: Secondary | ICD-10-CM | POA: Diagnosis not present

## 2023-09-20 DIAGNOSIS — E1165 Type 2 diabetes mellitus with hyperglycemia: Secondary | ICD-10-CM

## 2023-09-20 DIAGNOSIS — E785 Hyperlipidemia, unspecified: Secondary | ICD-10-CM

## 2023-09-20 DIAGNOSIS — Z7984 Long term (current) use of oral hypoglycemic drugs: Secondary | ICD-10-CM | POA: Diagnosis not present

## 2023-09-20 DIAGNOSIS — K861 Other chronic pancreatitis: Secondary | ICD-10-CM

## 2023-09-20 DIAGNOSIS — R748 Abnormal levels of other serum enzymes: Secondary | ICD-10-CM | POA: Diagnosis not present

## 2023-09-20 DIAGNOSIS — E039 Hypothyroidism, unspecified: Secondary | ICD-10-CM | POA: Diagnosis not present

## 2023-09-20 LAB — CBC WITH DIFFERENTIAL/PLATELET
Basophils Absolute: 0.1 K/uL (ref 0.0–0.1)
Basophils Relative: 0.9 % (ref 0.0–3.0)
Eosinophils Absolute: 0.2 K/uL (ref 0.0–0.7)
Eosinophils Relative: 2.7 % (ref 0.0–5.0)
HCT: 43.9 % (ref 36.0–46.0)
Hemoglobin: 14.7 g/dL (ref 12.0–15.0)
Lymphocytes Relative: 30.5 % (ref 12.0–46.0)
Lymphs Abs: 2 K/uL (ref 0.7–4.0)
MCHC: 33.4 g/dL (ref 30.0–36.0)
MCV: 95.9 fl (ref 78.0–100.0)
Monocytes Absolute: 0.4 K/uL (ref 0.1–1.0)
Monocytes Relative: 6.7 % (ref 3.0–12.0)
Neutro Abs: 3.9 K/uL (ref 1.4–7.7)
Neutrophils Relative %: 59.2 % (ref 43.0–77.0)
Platelets: 168 K/uL (ref 150.0–400.0)
RBC: 4.58 Mil/uL (ref 3.87–5.11)
RDW: 13 % (ref 11.5–15.5)
WBC: 6.7 K/uL (ref 4.0–10.5)

## 2023-09-20 LAB — LIPID PANEL
Cholesterol: 143 mg/dL (ref 0–200)
HDL: 43.4 mg/dL (ref >39.00)
LDL Cholesterol: 72 mg/dL (ref 0–99)
NonHDL: 99.98
Total CHOL/HDL Ratio: 3
Triglycerides: 140 mg/dL (ref 0.0–149.0)
VLDL: 28 mg/dL (ref 0.0–40.0)

## 2023-09-20 LAB — COMPREHENSIVE METABOLIC PANEL WITH GFR
ALT: 30 U/L (ref 0–35)
AST: 25 U/L (ref 0–37)
Albumin: 4.4 g/dL (ref 3.5–5.2)
Alkaline Phosphatase: 72 U/L (ref 39–117)
BUN: 12 mg/dL (ref 6–23)
CO2: 26 meq/L (ref 19–32)
Calcium: 9.7 mg/dL (ref 8.4–10.5)
Chloride: 101 meq/L (ref 96–112)
Creatinine, Ser: 0.66 mg/dL (ref 0.40–1.20)
GFR: 93.2 mL/min (ref >60.00)
Glucose, Bld: 155 mg/dL — ABNORMAL HIGH (ref 70–99)
Potassium: 4 meq/L (ref 3.5–5.1)
Sodium: 138 meq/L (ref 135–145)
Total Bilirubin: 0.3 mg/dL (ref 0.2–1.2)
Total Protein: 7.5 g/dL (ref 6.0–8.3)

## 2023-09-20 LAB — AMYLASE: Amylase: 73 U/L (ref 27–131)

## 2023-09-20 LAB — HEMOGLOBIN A1C: Hgb A1c MFr Bld: 8.4 % — ABNORMAL HIGH (ref 4.6–6.5)

## 2023-09-20 LAB — CK: Total CK: 59 U/L (ref 17–177)

## 2023-09-20 LAB — TSH: TSH: 3.82 u[IU]/mL (ref 0.35–5.50)

## 2023-09-20 LAB — MICROALBUMIN / CREATININE URINE RATIO
Creatinine,U: 77.8 mg/dL
Microalb Creat Ratio: 11.4 mg/g (ref 0.0–30.0)
Microalb, Ur: 0.9 mg/dL (ref 0.0–1.9)

## 2023-09-20 MED ORDER — ROSUVASTATIN CALCIUM 20 MG PO TABS: 20.0000 mg | ORAL_TABLET | Freq: Every day | ORAL | 3 refills | Status: AC

## 2023-09-20 MED ORDER — METFORMIN HCL ER 500 MG PO TB24: ORAL_TABLET | ORAL | 1 refills | Status: AC

## 2023-09-20 MED ORDER — AMLODIPINE BESYLATE 5 MG PO TABS: 5.0000 mg | ORAL_TABLET | Freq: Every day | ORAL | 3 refills | Status: AC

## 2023-09-20 MED ORDER — LEVOTHYROXINE SODIUM 125 MCG PO TABS: 125.0000 ug | ORAL_TABLET | Freq: Every day | ORAL | 3 refills | Status: DC

## 2023-09-20 NOTE — Assessment & Plan Note (Signed)
 Tolerating statin, encouraged heart healthy diet, avoid trans fats, minimize simple carbs and saturated fats. Increase exercise as tolerated

## 2023-09-20 NOTE — Assessment & Plan Note (Signed)
 Well controlled, no changes to meds. Encouraged heart healthy diet such as the DASH diet and exercise as tolerated.
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
## 2023-09-22 ENCOUNTER — Ambulatory Visit: Payer: Self-pay | Admitting: Family Medicine

## 2023-09-22 ENCOUNTER — Other Ambulatory Visit: Payer: Self-pay | Admitting: Family Medicine

## 2023-09-22 DIAGNOSIS — E785 Hyperlipidemia, unspecified: Secondary | ICD-10-CM

## 2023-09-22 DIAGNOSIS — I10 Essential (primary) hypertension: Secondary | ICD-10-CM

## 2023-09-22 DIAGNOSIS — E1165 Type 2 diabetes mellitus with hyperglycemia: Secondary | ICD-10-CM

## 2023-09-25 ENCOUNTER — Other Ambulatory Visit: Payer: Self-pay

## 2023-09-27 ENCOUNTER — Other Ambulatory Visit: Payer: Self-pay

## 2023-09-27 MED ORDER — EMPAGLIFLOZIN 10 MG PO TABS: 10.0000 mg | ORAL_TABLET | Freq: Every day | ORAL | 1 refills | Status: DC

## 2023-10-03 ENCOUNTER — Encounter: Payer: Self-pay | Admitting: Family Medicine

## 2023-10-07 ENCOUNTER — Encounter: Payer: Self-pay | Admitting: Family Medicine

## 2023-10-08 ENCOUNTER — Other Ambulatory Visit: Payer: Self-pay | Admitting: Family Medicine

## 2023-10-08 MED ORDER — FLUCONAZOLE 150 MG PO TABS: 150.0000 mg | ORAL_TABLET | Freq: Every day | ORAL | 0 refills | Status: AC

## 2023-11-27 ENCOUNTER — Ambulatory Visit: Payer: Self-pay

## 2023-11-27 NOTE — Telephone Encounter (Signed)
 Noted. FYI

## 2023-11-27 NOTE — Telephone Encounter (Signed)
 FYI Only or Action Required?: FYI only for provider: appointment scheduled on 11/29/2023.  Patient was last seen in primary care on 09/20/2023 by Antonio Meth, Jamee SAUNDERS, DO.  Called Nurse Triage reporting Dizziness.  Symptoms began about a month ago.  Interventions attempted: Nothing.  Symptoms are: gradually worsening.  Triage Disposition: See Physician Within 24 Hours  Patient/caregiver understands and will follow disposition?: Yes       Message from Latty H sent at 11/27/2023 11:35 AM EST  Summary: light headedness,off balanced   Reason for Triage: Patient experiencing possible side effect form jardiance  medication Light headedness,off balanced Refused to speak with NT,stated that they are located in [place] and don't know what's going on Patient mad appointment through mychart with PCP for Friday         Reason for Disposition  Taking a medicine that could cause dizziness (e.g., blood pressure medications, diuretics)  Answer Assessment - Initial Assessment Questions Patient sates she is concerned about symptoms and would like to discuss with PCP. BP is normal and her sugar has been stable. Patient self scheduled an appointment in office and declined to be seen anywhere else for symptoms.  1. DESCRIPTION: Describe your dizziness.     Feels like headed off balance  2. LIGHTHEADED: Do you feel lightheaded? (e.g., somewhat faint, woozy, weak upon standing)     Light  3. VERTIGO: Do you feel like either you or the room is spinning or tilting? (i.e., vertigo)     Denies  4. SEVERITY: How bad is it?  Do you feel like you are going to faint? Can you stand and walk?     Mild subsides in the evening time  5. ONSET:  When did the dizziness begin?     1 month ago  6. CAUSE: What do you think is causing the dizziness? (e.g., decreased fluids or food, diarrhea, emotional distress, heat exposure, new medicine, sudden standing, vomiting; unknown)     Medication   7. RECURRENT SYMPTOM: Have you had dizziness before? If Yes, ask: When was the last time? What happened that time?     States it is not her norm  8. OTHER SYMPTOMS: Do you have any other symptoms? (e.g., fever, chest pain, vomiting, diarrhea, bleeding)       Off balance feeling  Protocols used: Dizziness - Lightheadedness-A-AH

## 2023-11-28 ENCOUNTER — Ambulatory Visit: Payer: PRIVATE HEALTH INSURANCE | Admitting: Family Medicine

## 2023-11-28 ENCOUNTER — Encounter: Payer: Self-pay | Admitting: Family Medicine

## 2023-11-28 VITALS — BP 140/80 | HR 100 | Temp 97.8°F | Resp 16 | Ht 68.0 in | Wt 282.8 lb

## 2023-11-28 DIAGNOSIS — I10 Essential (primary) hypertension: Secondary | ICD-10-CM | POA: Diagnosis not present

## 2023-11-28 DIAGNOSIS — E039 Hypothyroidism, unspecified: Secondary | ICD-10-CM

## 2023-11-28 DIAGNOSIS — E785 Hyperlipidemia, unspecified: Secondary | ICD-10-CM | POA: Diagnosis not present

## 2023-11-28 DIAGNOSIS — E1165 Type 2 diabetes mellitus with hyperglycemia: Secondary | ICD-10-CM

## 2023-11-28 DIAGNOSIS — Z7985 Long-term (current) use of injectable non-insulin antidiabetic drugs: Secondary | ICD-10-CM

## 2023-11-28 MED ORDER — TIRZEPATIDE 2.5 MG/0.5ML ~~LOC~~ SOAJ: 2.5000 mg | SUBCUTANEOUS | 0 refills | Status: AC

## 2023-11-28 MED ORDER — LEVOTHYROXINE SODIUM 125 MCG PO TABS: 125.0000 ug | ORAL_TABLET | Freq: Every day | ORAL | 3 refills | Status: AC

## 2023-11-28 NOTE — Progress Notes (Signed)
 Subjective:    Patient ID: Kristin Hubbard, female    DOB: 31-Jul-1960, 63 y.o.   MRN: [id number]  Chief Complaint  Patient presents with   Medication Problem    Pt here to discuss Jardiance  side effects. Pt states stopping medication yesterday. Pt states not having infection currently    HPI Patient is in today for side effects to jardiance .  Discussed the use of AI scribe software for clinical note transcription with the patient, who gave verbal consent to proceed.  History of Present Illness Kristin Hubbard is a 63 year old female with type 2 diabetes who presents with lightheadedness and concerns about side effects from Jardiance . She is accompanied by her daughter, Ronal.  She has been experiencing significant lightheadedness over the past one to two weeks, described as a sensation of her head being a 'bucket of water' sloshing from side to side. This primarily occurs upon standing, causing a feeling of pulling to one side, but is absent when sitting in the evening after work. She has been monitoring her blood pressure and blood sugar, which are high but within her usual range. She increased her water intake, suspecting dehydration, and stopped taking Jardiance  yesterday due to her concerns. Despite discontinuing Jardiance , she has not noticed significant improvement in her symptoms. She also reports a buzzing sensation in her head at times.  She has been on Jardiance  since August 2025 and has experienced three yeast infections since starting the medication, though no urinary tract infections. Her blood sugar readings have been between 130 to 160 mg/dL in the evening and around 170 mg/dL in the morning before eating. She is currently taking metformin  at a dose of 500 mg three times a day. She has a history of trying other diabetes medications, including Rybelsus and Ozempic, which caused stomach issues and pancreatitis, respectively. She also tried Mounjaro , which was discontinued due to  pancreatitis concerns.  She mentions difficulty with typing at work, where her hands seem to be on the wrong keys, but she is unsure if this is related to her symptoms or a sticky keyboard. She has not eaten today.  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 The child is in sixth grade but functions at a much lower cognitive level and has been difficult to manage in public settings.    Past Medical History:  Diagnosis Date   Anxiety    Bilateral primary osteoarthritis of knee 01/31/2016   Complication of anesthesia    after 1 st surgery was combative,other surgeries have been fine   Hypertension    Hypothyroidism    Migraine    Thyroid  disease    Hypothyroidism    Past Surgical History:  Procedure Laterality Date   BUNIONECTOMY     hammer toe   CHOLECYSTECTOMY     KNEE SURGERY  05/2007   left   TOTAL KNEE ARTHROPLASTY Bilateral 01/31/2016   Procedure: TOTAL KNEE BILATERAL;  Surgeon: Fonda Olmsted, MD;  Location: [facility] OR;  Service: Orthopedics;  Laterality: Bilateral;    Family History  Problem Relation Age of Onset   Diabetes Other    Alcohol abuse Father    Diabetes Father    Heart attack Father    Alcohol abuse Mother    Kidney disease Mother     Social History   Socioeconomic History   Marital status: Single    Spouse name: Not on file   Number of children:  Not on file   Years of education: Not on file   Highest education level: Associate degree: academic program  Occupational History   Occupation: Database Administrator: [employer]  Tobacco Use   Smoking status: Every Day    Current packs/day: 1.00    Average packs/day: 1 pack/day for 17.0 years (17.0 ttl pk-yrs)    Types: Cigarettes   Smokeless tobacco: Never   Tobacco comments:    she said when she loses 100 lbs she will quit smoking  Substance and Sexual Activity   Alcohol use: No   Drug use: No   Sexual activity: Not  Currently    Partners: Male  Other Topics Concern   Not on file  Social History Narrative   Exercise--  Trainer and gym   Social Drivers of Health   Financial Resource Strain: Low Risk  (11/08/2022)   Overall Financial Resource Strain (CARDIA)    Difficulty of Paying Living Expenses: Not hard at all  Food Insecurity: No Food Insecurity (11/08/2022)   Hunger Vital Sign    Worried About Running Out of Food in the Last Year: Never true    Ran Out of Food in the Last Year: Never true  Transportation Needs: No Transportation Needs (11/08/2022)   PRAPARE - Administrator, Civil Service (Medical): No    Lack of Transportation (Non-Medical): No  Physical Activity: Sufficiently Active (11/08/2022)   Exercise Vital Sign    Days of Exercise per Week: 3 days    Minutes of Exercise per Session: 50 min  Stress: No Stress Concern Present (11/08/2022)   [employer] of Occupational Health - Occupational Stress Questionnaire    Feeling of Stress : Only a little  Social Connections: Moderately Integrated (11/08/2022)   Social Connection and Isolation Panel    Frequency of Communication with Friends and Family: Twice a week    Frequency of Social Gatherings with Friends and Family: Once a week    Attends Religious Services: More than 4 times per year    Active Member of [employer] or Organizations: Yes    Attends Engineer, Structural: More than 4 times per year    Marital Status: Never married  Intimate Partner Violence: Not on file    Outpatient Medications Prior to Visit  Medication Sig Dispense Refill   amLODipine  (NORVASC ) 5 MG tablet Take 1 tablet (5 mg total) by mouth daily. Needs appt 90 tablet 3   blood glucose meter kit and supplies Dispense based on patient and insurance preference. Use up to four times daily as directed. (FOR ICD-10 E10.9, E11.9). 1 each 0   fluconazole  (DIFLUCAN ) 150 MG tablet Take 1 tablet (150 mg total) by mouth daily. May repeat in 3 days if  needed. 2 tablet 0   glucose blood (ACCU-CHEK GUIDE) test strip USE 1 STRIP UP TO 4 TIMES DAILY AS DIRECTED 100 each 12   metFORMIN  (GLUCOPHAGE -XR) 500 MG 24 hr tablet 2 po every day 180 tablet 1   rosuvastatin  (CRESTOR ) 20 MG tablet Take 1 tablet (20 mg total) by mouth at bedtime. Needs appt 90 tablet 3   empagliflozin  (JARDIANCE ) 10 MG TABS tablet Take 1 tablet (10 mg total) by mouth daily. 90 tablet 1   levothyroxine  (SYNTHROID ) 125 MCG tablet Take 1 tablet (125 mcg total) by mouth daily. 90 tablet 3   No facility-administered medications prior to visit.    Allergies  Allergen Reactions   Lisinopril Cough   Vicodin [Hydrocodone-Acetaminophen ]  Pt stated, makes me constipated    Review of Systems  Constitutional:  Negative for chills, fever and malaise/fatigue.  HENT:  Negative for congestion and hearing loss.   Eyes:  Negative for blurred vision and discharge.  Respiratory:  Negative for cough, sputum production and shortness of breath.   Cardiovascular:  Negative for chest pain, palpitations and leg swelling.  Gastrointestinal:  Negative for abdominal pain, blood in stool, constipation, diarrhea, heartburn, nausea and vomiting.  Genitourinary:  Negative for dysuria, frequency, hematuria and urgency.  Musculoskeletal:  Negative for back pain, falls and myalgias.  Skin:  Negative for rash.  Neurological:  Negative for dizziness, sensory change, loss of consciousness, weakness and headaches.  Endo/Heme/Allergies:  Negative for environmental allergies. Does not bruise/bleed easily.  Psychiatric/Behavioral:  Negative for depression and suicidal ideas. The patient is not nervous/anxious and does not have insomnia.        Objective:    Physical Exam Vitals and nursing note reviewed.  Constitutional:      General: She is not in acute distress.    Appearance: Normal appearance. She is well-developed.  HENT:     Head: Normocephalic and atraumatic.  Eyes:     General: No  scleral icterus.       Right eye: No discharge.        Left eye: No discharge.  Cardiovascular:     Rate and Rhythm: Normal rate and regular rhythm.     Heart sounds: No murmur heard. Pulmonary:     Effort: Pulmonary effort is normal. No respiratory distress.     Breath sounds: Normal breath sounds.  Musculoskeletal:        General: Normal range of motion.     Cervical back: Normal range of motion and neck supple.     Right lower leg: No edema.     Left lower leg: No edema.  Skin:    General: Skin is warm and dry.  Neurological:     Mental Status: She is alert and oriented to person, place, and time.  Psychiatric:        Mood and Affect: Mood normal.        Behavior: Behavior normal.        Thought Content: Thought content normal.        Judgment: Judgment normal.     BP (!) 140/80 (BP Location: Left Arm, Patient Position: Sitting, Cuff Size: Large)   Pulse 100   Temp 97.8 F (36.6 C) (Oral)   Resp 16   Ht 5' 8 (1.727 m)   Wt 282 lb 12.8 oz (128.3 kg)   SpO2 97%   BMI 43.00 kg/m  Wt Readings from Last 3 Encounters:  11/28/23 282 lb 12.8 oz (128.3 kg)  09/20/23 287 lb 6.4 oz (130.4 kg)  11/08/22 287 lb 6.4 oz (130.4 kg)    Diabetic Foot Exam - Simple   No data filed    Lab Results  Component Value Date   WBC 6.7 09/20/2023   HGB 14.7 09/20/2023   HCT 43.9 09/20/2023   PLT 168.0 09/20/2023   GLUCOSE 155 (H) 09/20/2023   CHOL 143 09/20/2023   TRIG 140.0 09/20/2023   HDL 43.40 09/20/2023   LDLDIRECT 144.0 03/13/2021   LDLCALC 72 09/20/2023   ALT 30 09/20/2023   AST 25 09/20/2023   NA 138 09/20/2023   K 4.0 09/20/2023   CL 101 09/20/2023   CREATININE 0.66 09/20/2023   BUN 12 09/20/2023   CO2 26 09/20/2023  TSH 3.82 09/20/2023   HGBA1C 8.4 (H) 09/20/2023   MICROALBUR 0.9 09/20/2023    Lab Results  Component Value Date   TSH 3.82 09/20/2023   Lab Results  Component Value Date   WBC 6.7 09/20/2023   HGB 14.7 09/20/2023   HCT 43.9 09/20/2023    MCV 95.9 09/20/2023   PLT 168.0 09/20/2023   Lab Results  Component Value Date   NA 138 09/20/2023   K 4.0 09/20/2023   CO2 26 09/20/2023   GLUCOSE 155 (H) 09/20/2023   BUN 12 09/20/2023   CREATININE 0.66 09/20/2023   BILITOT 0.3 09/20/2023   ALKPHOS 72 09/20/2023   AST 25 09/20/2023   ALT 30 09/20/2023   PROT 7.5 09/20/2023   ALBUMIN 4.4 09/20/2023   CALCIUM  9.7 09/20/2023   ANIONGAP 7 02/01/2016   GFR 93.20 09/20/2023   Lab Results  Component Value Date   CHOL 143 09/20/2023   Lab Results  Component Value Date   HDL 43.40 09/20/2023   Lab Results  Component Value Date   LDLCALC 72 09/20/2023   Lab Results  Component Value Date   TRIG 140.0 09/20/2023   Lab Results  Component Value Date   CHOLHDL 3 09/20/2023   Lab Results  Component Value Date   HGBA1C 8.4 (H) 09/20/2023       Assessment & Plan:  Hyperlipidemia, unspecified hyperlipidemia type -     Comprehensive metabolic panel with GFR; Future -     Lipid panel; Future  Hypothyroidism, unspecified type -     Levothyroxine  Sodium; Take 1 tablet (125 mcg total) by mouth daily.  Dispense: 90 tablet; Refill: 3  Type 2 diabetes mellitus with hyperglycemia, without long-term current use of insulin  (HCC) -     Tirzepatide ; Inject 2.5 mg into the skin once a week.  Dispense: 6 mL; Refill: 0 -     Comprehensive metabolic panel with GFR; Future -     Hemoglobin A1c; Future -     Lipid panel; Future -     Microalbumin / creatinine urine ratio; Future -     CBC with Differential/Platelet; Future  Essential hypertension -     Comprehensive metabolic panel with GFR; Future -     Lipid panel; Future -     CBC with Differential/Platelet; Future  Assessment and Plan Assessment & Plan Lightheadedness, possibly medication-related   She experienced lightheadedness for the past week or two, worsened by standing, described as a sloshing sensation. Symptoms began after starting Jardiance  two months ago, suggesting  possible dehydration due to the medication. The differential includes medication side effects and dehydration. Discontinued Jardiance  and encouraged increased water intake.  Type 2 diabetes mellitus with hyperglycemia   Blood sugars range from 130 to 170 mg/dL, with fasting levels around 170 mg/dL. Previous medications included Jardiance , Rybelsus, Wegovy, and Mounjaro , with pancreatitis as a side effect. Currently on metformin  at maximum dose. Considering alternative medications due to side effects and pancreatitis risk. Discussed potential use of Mounjaro  at a lower dose with an extended dosing interval to minimize pancreatitis risk. Ordered blood work to assess current status and prescribed Mounjaro  at the lowest dose with an extended dosing interval.  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 Discontinued Jardiance .  History of drug-induced pancreatitis   Pancreatitis was associated with previous use of Rybelsus and Mounjaro , with symptoms of discomfort rather than pain. Discussed potential pancreatitis risk with Mounjaro  and plan to  monitor for symptoms.  Essential hypertension   Blood pressure readings are generally well-controlled, with occasional low diastolic readings.  Hypothyroidism   Issues with the pharmacy not providing a 90-day supply. Ensure pharmacy provides a 90-day supply of thyroid  medication.    Keesha Pellum R Lowne Chase, DO

## 2023-11-29 ENCOUNTER — Ambulatory Visit: Payer: PRIVATE HEALTH INSURANCE | Admitting: Family Medicine

## 2023-11-29 ENCOUNTER — Other Ambulatory Visit: Payer: Self-pay | Admitting: Family Medicine

## 2023-11-29 ENCOUNTER — Other Ambulatory Visit (INDEPENDENT_AMBULATORY_CARE_PROVIDER_SITE_OTHER): Payer: PRIVATE HEALTH INSURANCE

## 2023-11-29 ENCOUNTER — Encounter: Payer: Self-pay | Admitting: Family Medicine

## 2023-11-29 DIAGNOSIS — I10 Essential (primary) hypertension: Secondary | ICD-10-CM | POA: Diagnosis not present

## 2023-11-29 DIAGNOSIS — E785 Hyperlipidemia, unspecified: Secondary | ICD-10-CM | POA: Diagnosis not present

## 2023-11-29 DIAGNOSIS — R42 Dizziness and giddiness: Secondary | ICD-10-CM

## 2023-11-29 DIAGNOSIS — E1165 Type 2 diabetes mellitus with hyperglycemia: Secondary | ICD-10-CM | POA: Diagnosis not present

## 2023-11-29 LAB — CBC WITH DIFFERENTIAL/PLATELET
Basophils Absolute: 0.1 K/uL (ref 0.0–0.1)
Basophils Relative: 1 % (ref 0.0–3.0)
Eosinophils Absolute: 0.2 K/uL (ref 0.0–0.7)
Eosinophils Relative: 2.8 % (ref 0.0–5.0)
HCT: 44.4 % (ref 36.0–46.0)
Hemoglobin: 15 g/dL (ref 12.0–15.0)
Lymphocytes Relative: 32.8 % (ref 12.0–46.0)
Lymphs Abs: 2.1 K/uL (ref 0.7–4.0)
MCHC: 33.7 g/dL (ref 30.0–36.0)
MCV: 96.1 fl (ref 78.0–100.0)
Monocytes Absolute: 0.5 K/uL (ref 0.1–1.0)
Monocytes Relative: 7.6 % (ref 3.0–12.0)
Neutro Abs: 3.6 K/uL (ref 1.4–7.7)
Neutrophils Relative %: 55.8 % (ref 43.0–77.0)
Platelets: 173 K/uL (ref 150.0–400.0)
RBC: 4.63 Mil/uL (ref 3.87–5.11)
RDW: 13.3 % (ref 11.5–15.5)
WBC: 6.4 K/uL (ref 4.0–10.5)

## 2023-11-29 LAB — LIPID PANEL
Cholesterol: 131 mg/dL (ref 0–200)
HDL: 40.2 mg/dL (ref >39.00)
LDL Cholesterol: 65 mg/dL (ref 0–99)
NonHDL: 90.46
Total CHOL/HDL Ratio: 3
Triglycerides: 129 mg/dL (ref 0.0–149.0)
VLDL: 25.8 mg/dL (ref 0.0–40.0)

## 2023-11-29 LAB — COMPREHENSIVE METABOLIC PANEL WITH GFR
ALT: 25 U/L (ref 0–35)
AST: 19 U/L (ref 0–37)
Albumin: 4.3 g/dL (ref 3.5–5.2)
Alkaline Phosphatase: 76 U/L (ref 39–117)
BUN: 12 mg/dL (ref 6–23)
CO2: 25 meq/L (ref 19–32)
Calcium: 9.1 mg/dL (ref 8.4–10.5)
Chloride: 104 meq/L (ref 96–112)
Creatinine, Ser: 0.66 mg/dL (ref 0.40–1.20)
GFR: 93.07 mL/min (ref >60.00)
Glucose, Bld: 196 mg/dL — ABNORMAL HIGH (ref 70–99)
Potassium: 3.9 meq/L (ref 3.5–5.1)
Sodium: 139 meq/L (ref 135–145)
Total Bilirubin: 0.4 mg/dL (ref 0.2–1.2)
Total Protein: 6.8 g/dL (ref 6.0–8.3)

## 2023-11-29 LAB — HEMOGLOBIN A1C: Hgb A1c MFr Bld: 7.7 % — ABNORMAL HIGH (ref 4.6–6.5)

## 2023-11-29 LAB — MICROALBUMIN / CREATININE URINE RATIO
Creatinine,U: 72.2 mg/dL
Microalb Creat Ratio: 16 mg/g (ref 0.0–30.0)
Microalb, Ur: 1.2 mg/dL (ref 0.0–1.9)

## 2023-11-29 MED ORDER — MECLIZINE HCL 25 MG PO TABS: 25.0000 mg | ORAL_TABLET | Freq: Three times a day (TID) | ORAL | 0 refills | Status: AC | PRN

## 2023-12-11 ENCOUNTER — Ambulatory Visit: Payer: Self-pay | Admitting: Family Medicine

## 2023-12-11 ENCOUNTER — Other Ambulatory Visit: Payer: Self-pay | Admitting: Family Medicine

## 2023-12-11 DIAGNOSIS — E785 Hyperlipidemia, unspecified: Secondary | ICD-10-CM

## 2023-12-11 DIAGNOSIS — E1165 Type 2 diabetes mellitus with hyperglycemia: Secondary | ICD-10-CM

## 2023-12-11 DIAGNOSIS — I10 Essential (primary) hypertension: Secondary | ICD-10-CM

## 2023-12-25 ENCOUNTER — Other Ambulatory Visit: Payer: Self-pay | Admitting: Family Medicine
